# Patient Record
Sex: Male | Born: 1935 | Race: White | Hispanic: Yes | Marital: Married | Smoking: Former smoker
Health system: Southern US, Community
[De-identification: ages and names within clinical notes are randomized; demographics above are authoritative.]

## PROBLEM LIST (undated history)

## (undated) DIAGNOSIS — M1990 Unspecified osteoarthritis, unspecified site: Secondary | ICD-10-CM

## (undated) DIAGNOSIS — H349 Unspecified retinal vascular occlusion: Secondary | ICD-10-CM

## (undated) DIAGNOSIS — E119 Type 2 diabetes mellitus without complications: Secondary | ICD-10-CM

## (undated) DIAGNOSIS — E039 Hypothyroidism, unspecified: Secondary | ICD-10-CM

## (undated) DIAGNOSIS — I10 Essential (primary) hypertension: Secondary | ICD-10-CM

## (undated) DIAGNOSIS — E785 Hyperlipidemia, unspecified: Secondary | ICD-10-CM

## (undated) HISTORY — PX: REPLACEMENT TOTAL KNEE: SUR1224

## (undated) HISTORY — DX: Hyperlipidemia, unspecified: E78.5

## (undated) HISTORY — PX: ROTATOR CUFF REPAIR: SHX139

## (undated) HISTORY — DX: Essential (primary) hypertension: I10

## (undated) HISTORY — DX: Type 2 diabetes mellitus without complications: E11.9

---

## 2009-06-04 DIAGNOSIS — I2510 Atherosclerotic heart disease of native coronary artery without angina pectoris: Secondary | ICD-10-CM

## 2009-06-04 HISTORY — PX: CARDIAC CATHETERIZATION: SHX172

## 2009-06-04 HISTORY — DX: Atherosclerotic heart disease of native coronary artery without angina pectoris: I25.10

## 2019-02-20 LAB — HM DIABETES EYE EXAM

## 2019-03-06 LAB — HM DIABETES EYE EXAM

## 2019-03-20 LAB — HM DIABETES EYE EXAM

## 2019-04-17 LAB — HM DIABETES EYE EXAM

## 2019-09-25 ENCOUNTER — Encounter: Payer: Self-pay | Admitting: Cardiology

## 2020-03-15 ENCOUNTER — Other Ambulatory Visit: Payer: Self-pay

## 2020-03-15 ENCOUNTER — Ambulatory Visit: Payer: Medicare Other | Admitting: Cardiology

## 2020-03-15 ENCOUNTER — Encounter: Payer: Self-pay | Admitting: Cardiology

## 2020-03-15 VITALS — BP 130/66 | HR 78 | Temp 97.9°F | Resp 16 | Ht 66.5 in | Wt 161.5 lb

## 2020-03-15 DIAGNOSIS — I739 Peripheral vascular disease, unspecified: Secondary | ICD-10-CM

## 2020-03-15 DIAGNOSIS — I493 Ventricular premature depolarization: Secondary | ICD-10-CM

## 2020-03-15 DIAGNOSIS — Z794 Long term (current) use of insulin: Secondary | ICD-10-CM

## 2020-03-15 DIAGNOSIS — I2510 Atherosclerotic heart disease of native coronary artery without angina pectoris: Secondary | ICD-10-CM

## 2020-03-15 DIAGNOSIS — E1169 Type 2 diabetes mellitus with other specified complication: Secondary | ICD-10-CM

## 2020-03-15 DIAGNOSIS — E7800 Pure hypercholesterolemia, unspecified: Secondary | ICD-10-CM

## 2020-03-15 MED ORDER — NITROGLYCERIN 0.4 MG SL SUBL: 0.4000 mg | SUBLINGUAL_TABLET | SUBLINGUAL | 3 refills | Status: AC | PRN

## 2020-03-15 NOTE — Progress Notes (Signed)
Primary Physician/Referring:  Leanna Battles, MD  Patient ID: Jaime [name], male    DOB: [birth year], 86 y.o.   MRN: [id number]  Chief Complaint  Patient presents with  . Coronary Artery Disease  . New Patient (Initial Visit)    Referred by Dr. Philip Aspen   HPI:    [name]  is a 86 y.o. Retired Pharmacist, community by profession, hypertension, hyperlipidemia, diabetes mellitus, coronary artery disease with PTCA and stenting to the mid LAD with Xience 2.5 x 32 and overlapping 2.5 x 12 mm DES placed on 06/04/2009.  Since then he has not had any recurrence of angina pectoris.  Recently moved from [place] to be closer to his daughter in [place].  He also has history of PAD from diabetes mellitus.  Remains active.  His wife has MS and he is the active caregiver. Referred to me to establish cardiac care.  He is presently doing well and has been scheduled for complete physical examination with his PCP Dr. Leanna Battles soon.  Past Medical History:  Diagnosis Date  . Coronary artery disease 06/04/2009   stenting to the mid LAD with Xience 2.5 x 32 and overlapping 2.5 x 12 mm DES, [place], [place]  . Diabetes mellitus without complication ([place])   . Hyperlipidemia   . Hypertension    Past Surgical History:  Procedure Laterality Date  . CARDIAC CATHETERIZATION  06/04/2009   Stent to LAD in [place], [place]  . REPLACEMENT TOTAL KNEE     bOTH  . ROTATOR CUFF REPAIR Bilateral    Family History  Problem Relation Age of Onset  . Leukemia Mother   . Heart attack Brother   . Lung cancer Brother     Social History   Tobacco Use  . Smoking status: Never Smoker  . Smokeless tobacco: Never Used  Substance Use Topics  . Alcohol use: Yes    Comment: Drinks Socially   Marital Status:    ROS  Review of Systems  Constitution: Negative for weight gain.  Cardiovascular: Negative for dyspnea on exertion, leg swelling and syncope.  Respiratory: Negative for shortness of breath.     Musculoskeletal: Positive for back pain. Negative for joint swelling.   Objective  Blood pressure 130/66, pulse 78, temperature 97.9 F (36.6 C), temperature source Temporal, resp. rate 16, height 5' 6.5" (1.689 m), weight 161 lb 8 oz (73.3 kg), SpO2 95 %.  Vitals with BMI 03/15/2020  Height 5' 6.5"  Weight 161 lbs 8 oz  BMI 38.25  Systolic 053  Diastolic 66  Pulse 78     Physical Exam  Constitutional: He appears well-developed and well-nourished. No distress.  Cardiovascular: Normal rate, regular rhythm and intact distal pulses. Exam reveals no gallop.  No murmur heard. S1 is normal, S2 is widely split with physiologic variation.   No leg edema. No JVD.    Pulmonary/Chest: Effort normal and breath sounds normal. No accessory muscle usage. No respiratory distress.  Abdominal: Soft.   Laboratory examination:   No results for input(s): NA, K, CL, CO2, GLUCOSE, BUN, CREATININE, CALCIUM, GFRNONAA, GFRAA in the last 8760 hours. CrCl cannot be calculated (No successful lab value found.).  No flowsheet data found. No flowsheet data found. Lipid Panel  No results found for: CHOL, TRIG, HDL, CHOLHDL, VLDL, LDLCALC, LDLDIRECT HEMOGLOBIN A1C No results found for: HGBA1C, MPG TSH No results for input(s): TSH in the last 8760 hours.  External labs:    None  Medications and allergies  No Known Allergies  Current Outpatient Medications  Medication Instructions  . aspirin EC 81 mg, Oral, Daily  . gabapentin (NEURONTIN) 300 mg, Oral, Daily  . ibuprofen (ADVIL) 400 mg, Oral, Every 6 hours PRN  . Jardiance 25 mg, Oral, Daily  . levothyroxine (SYNTHROID) 150 MCG tablet 1 tablet, Oral, Daily  . levothyroxine (SYNTHROID) 175 mcg, Oral, Every other day  . Metoprolol Succinate 25 mg, Oral  . mirabegron ER (MYRBETRIQ) 50 mg, Oral, Daily  . nitroGLYCERIN (NITROSTAT) 0.4 mg, Sublingual, Every 5 min PRN  . rosuvastatin (CRESTOR) 20 MG tablet 1 tablet, Oral, Daily at bedtime  . Tresiba  FlexTouch 17 Units, Subcutaneous, Daily  . Vitamin D, Cholecalciferol, 50 MCG (2000 UT) CAPS 1 capsule, Oral, Daily  . vitamin E 400 Units, Oral, Daily  . zolpidem (AMBIEN) 10 mg, Oral, At bedtime PRN   Radiology:   No results found.  Cardiac Studies:    EKG  EKG 03/15/2020: Normal sinus rhythm at the rate of 71 bpm, inferior infarct old.  Right bundle branch block.  Low-voltage complexes.   EKG 03/15/2020 revealing lead reversal, hence repeat EKG performed.  Does reveal frequent PVCs.  Assessment     ICD-10-CM   1. Coronary artery disease involving native coronary artery of native heart without angina pectoris  I25.10 EKG 12-Lead    nitroGLYCERIN (NITROSTAT) 0.4 MG SL tablet    PCV ECHOCARDIOGRAM COMPLETE  2. Frequent unifocal PVCs  I49.3 PCV ECHOCARDIOGRAM COMPLETE  3. Pure hypercholesterolemia  E78.00   4. Peripheral vascular disease (HCC)  I73.9   5. Type 2 diabetes mellitus with other specified complication, with long-term current use of insulin (HCC)  E11.69    Z79.4     Meds ordered this encounter  Medications  . nitroGLYCERIN (NITROSTAT) 0.4 MG SL tablet    Sig: Place 1 tablet (0.4 mg total) under the tongue every 5 (five) minutes as needed for up to 25 days for chest pain.    Dispense:  25 tablet    Refill:  3    There are no discontinued medications.  Recommendations:   Jaime [name]  is a 86 y.o. Retired Education officer, community by profession, hypertension, hyperlipidemia, diabetes mellitus, coronary artery disease with PTCA and stenting to the mid LAD with Xience 2.5 x 32 and overlapping 2.5 x 12 mm DES placed on 06/04/2009.  Since then he has not had any recurrence of angina pectoris.  Recently moved from [place] to be closer to his daughter in [place].  He is presently asymptomatic and physical examination except for widely split second heart sound related to underlying right bundle branch block, do not see any abnormalities.  Vascular examination mildly abnormal with  mildly reduced pedal pulse otherwise normal.  I will obtain an echocardiogram to establish a baseline for known PAD and abnormal EKG.  Otherwise stable from cardiac standpoint and is on appropriate medical therapy.  I will to see him back in 3 months as he is new to me.  His blood pressures well controlled and is on high-dose and high intensity statins as well.  S/L NTG was prescribed and explained how to and when to use it and to notify us if there is change in frequency of use.  Yates Decamp, MD, [facility] 03/17/2020, 11:01 PM [facility] Pager: [phone] Office: [phone]

## 2020-03-17 ENCOUNTER — Encounter: Payer: Self-pay | Admitting: Cardiology

## 2020-03-23 ENCOUNTER — Other Ambulatory Visit: Payer: Self-pay

## 2020-03-23 ENCOUNTER — Ambulatory Visit (INDEPENDENT_AMBULATORY_CARE_PROVIDER_SITE_OTHER): Payer: Medicare Other | Admitting: Physical Medicine and Rehabilitation

## 2020-03-23 ENCOUNTER — Ambulatory Visit (INDEPENDENT_AMBULATORY_CARE_PROVIDER_SITE_OTHER): Payer: Medicare Other

## 2020-03-23 VITALS — BP 133/81 | HR 69 | Ht 66.5 in | Wt 162.0 lb

## 2020-03-23 DIAGNOSIS — G8929 Other chronic pain: Secondary | ICD-10-CM | POA: Diagnosis not present

## 2020-03-23 DIAGNOSIS — I2510 Atherosclerotic heart disease of native coronary artery without angina pectoris: Secondary | ICD-10-CM | POA: Diagnosis not present

## 2020-03-23 DIAGNOSIS — M5416 Radiculopathy, lumbar region: Secondary | ICD-10-CM

## 2020-03-23 DIAGNOSIS — M5442 Lumbago with sciatica, left side: Secondary | ICD-10-CM | POA: Diagnosis not present

## 2020-03-23 DIAGNOSIS — M961 Postlaminectomy syndrome, not elsewhere classified: Secondary | ICD-10-CM | POA: Diagnosis not present

## 2020-03-23 NOTE — Progress Notes (Signed)
Pt states pain across the lower back that radiates into the outside of the left thigh. Pt states pain started 6-7 years ago and has gotten worse in the past few months. Pt states injection done by Dr. Zachery Conch in [place] in February, pt states it helped at first and returned 3 months after. Pt states standing still and playing golf makes pain worse. Sitting and stretching helps with pain.   .Numeric Pain Rating Scale and Functional Assessment Average Pain 8 Pain Right Now 3 My pain is intermittent and dull Pain is worse with: standing Pain improves with: sitting   In the last MONTH (on 0-10 scale) has pain interfered with the following?  1. General activity like being  able to carry out your everyday physical activities such as walking, climbing stairs, carrying groceries, or moving a chair?  Rating(8)  2. Relation with others like being able to carry out your usual social activities and roles such as  activities at home, at work and in your community. Rating(8)  3. Enjoyment of life such that you have  been bothered by emotional problems such as feeling anxious, depressed or irritable?  Rating(4)

## 2020-03-25 ENCOUNTER — Ambulatory Visit: Payer: Self-pay

## 2020-03-25 ENCOUNTER — Ambulatory Visit (INDEPENDENT_AMBULATORY_CARE_PROVIDER_SITE_OTHER): Payer: Medicare Other | Admitting: Physical Medicine and Rehabilitation

## 2020-03-25 ENCOUNTER — Encounter: Payer: Self-pay | Admitting: Physical Medicine and Rehabilitation

## 2020-03-25 ENCOUNTER — Other Ambulatory Visit: Payer: Self-pay

## 2020-03-25 VITALS — BP 111/62 | HR 78

## 2020-03-25 DIAGNOSIS — M5416 Radiculopathy, lumbar region: Secondary | ICD-10-CM | POA: Diagnosis not present

## 2020-03-25 MED ORDER — METHYLPREDNISOLONE ACETATE 80 MG/ML IJ SUSP
40.0000 mg | Freq: Once | INTRAMUSCULAR | Status: AC
  Administered 2020-03-25: 40 mg

## 2020-03-25 NOTE — Progress Notes (Signed)
Pt states pain in the lower back that radiates into the left thigh. Pt states no major changes since last visit   .Numeric Pain Rating Scale and Functional Assessment Average Pain 5   In the last MONTH (on 0-10 scale) has pain interfered with the following?  1. General activity like being  able to carry out your everyday physical activities such as walking, climbing stairs, carrying groceries, or moving a chair?  Rating(5)   +Driver, -BT, -Dye Allergies.

## 2020-03-26 NOTE — Progress Notes (Signed)
Jaime [name] - 86 y.o. male MRN [id number]  Date of birth: 11-08-1930  Office Visit Note: Visit Date: 03/25/2020 PCP: Leanna Battles, MD Referred by: Leanna Battles, MD  Subjective: No chief complaint on file.  HPI:  Jaime [name] is a 86 y.o. male who comes in today for planned Left L2-L3 lumbar epidural steroid injection with fluoroscopic guidance.  The patient has failed conservative care including home exercise, medications, time and activity modification.  This injection will be diagnostic and hopefully therapeutic.  Please see requesting physician notes for further details and justification.  Patient wants to make appointment for 3 months out as this is the way he had done this previously with his prior provider who did lumbar injections.  We typically do not do that but I will go ahead make his appointment at 3 months but he knows to cancel or call if the symptoms just are not improved or if he still better at that point obviously would not need to do an injection every 3 months.  Did talk to him once again about his L5 symptoms and would consider L5 transforaminal injection.   ROS Otherwise per HPI.  Assessment & Plan: Visit Diagnoses:  1. Lumbar radiculopathy     Plan: No additional findings.   Meds & Orders:  Meds ordered this encounter  Medications  . methylPREDNISolone acetate (DEPO-MEDROL) injection 40 mg    Orders Placed This Encounter  Procedures  . XR C-ARM NO REPORT  . Epidural Steroid injection    Follow-up: Return in about 3 months (around 06/25/2020).   Procedures: No procedures performed  Lumbar Epidural Steroid Injection - Interlaminar Approach with Fluoroscopic Guidance  Patient: Jaime [name]      Date of Birth: [birth year] MRN: [id number] PCP: Leanna Battles, MD      Visit Date: 03/25/2020   Universal Protocol:     Consent Given By: the patient  Position: PRONE  Additional Comments: Vital signs were monitored before and after  the procedure. Patient was prepped and draped in the usual sterile fashion. The correct patient, procedure, and site was verified.   Injection Procedure Details:  Procedure Site One Meds Administered:  Meds ordered this encounter  Medications  . methylPREDNISolone acetate (DEPO-MEDROL) injection 40 mg     Laterality: Left  Location/Site:  L2-L3  Needle size: 20 G  Needle type: Tuohy  Needle Placement: Paramedian epidural  Findings:   -Comments: Excellent flow of contrast into the epidural space.  Procedure Details: Using a paramedian approach from the side mentioned above, the region overlying the inferior lamina was localized under fluoroscopic visualization and the soft tissues overlying this structure were infiltrated with 4 ml. of 1% Lidocaine without Epinephrine. The Tuohy needle was inserted into the epidural space using a paramedian approach.   The epidural space was localized using loss of resistance along with lateral and bi-planar fluoroscopic views.  After negative aspirate for air, blood, and CSF, a 2 ml. volume of Isovue-250 was injected into the epidural space and the flow of contrast was observed. Radiographs were obtained for documentation purposes.    The injectate was administered into the level noted above.   Additional Comments:  The patient tolerated the procedure well Dressing: 2 x 2 sterile gauze and Band-Aid    Post-procedure details: Patient was observed during the procedure. Post-procedure instructions were reviewed.  Patient left the clinic in stable condition.     Clinical History: No specialty comments available.     Objective:  VS:  HT:  WT:   BMI:     BP:111/62  HR:78bpm  TEMP: ( )  RESP:  Physical Exam Constitutional:      General: He is not in acute distress.    Appearance: Normal appearance. He is not ill-appearing.  HENT:     Head: Normocephalic and atraumatic.     Right Ear: External ear normal.     Left Ear:  External ear normal.  Eyes:     Extraocular Movements: Extraocular movements intact.  Cardiovascular:     Rate and Rhythm: Normal rate.     Pulses: Normal pulses.  Abdominal:     General: There is no distension.     Palpations: Abdomen is soft.  Musculoskeletal:        General: No tenderness or signs of injury.     Right lower leg: No edema.     Left lower leg: No edema.     Comments: Patient has good distal strength without clonus.  Skin:    Findings: No erythema or rash.  Neurological:     General: No focal deficit present.     Mental Status: He is alert and oriented to person, place, and time.     Sensory: No sensory deficit.     Motor: No weakness or abnormal muscle tone.     Coordination: Coordination normal.  Psychiatric:        Mood and Affect: Mood normal.        Behavior: Behavior normal.      Imaging: XR C-ARM NO REPORT  Result Date: 03/25/2020 Please see Notes tab for imaging impression.

## 2020-03-26 NOTE — Procedures (Signed)
Lumbar Epidural Steroid Injection - Interlaminar Approach with Fluoroscopic Guidance  Patient: Jaime [name]      Date of Birth: 1931/04/20 MRN: [id number] PCP: Jarome Matin, MD      Visit Date: 03/25/2020   Universal Protocol:     Consent Given By: the patient  Position: PRONE  Additional Comments: Vital signs were monitored before and after the procedure. Patient was prepped and draped in the usual sterile fashion. The correct patient, procedure, and site was verified.   Injection Procedure Details:  Procedure Site One Meds Administered:  Meds ordered this encounter  Medications  . methylPREDNISolone acetate (DEPO-MEDROL) injection 40 mg     Laterality: Left  Location/Site:  L2-L3  Needle size: 20 G  Needle type: Tuohy  Needle Placement: Paramedian epidural  Findings:   -Comments: Excellent flow of contrast into the epidural space.  Procedure Details: Using a paramedian approach from the side mentioned above, the region overlying the inferior lamina was localized under fluoroscopic visualization and the soft tissues overlying this structure were infiltrated with 4 ml. of 1% Lidocaine without Epinephrine. The Tuohy needle was inserted into the epidural space using a paramedian approach.   The epidural space was localized using loss of resistance along with lateral and bi-planar fluoroscopic views.  After negative aspirate for air, blood, and CSF, a 2 ml. volume of Isovue-250 was injected into the epidural space and the flow of contrast was observed. Radiographs were obtained for documentation purposes.    The injectate was administered into the level noted above.   Additional Comments:  The patient tolerated the procedure well Dressing: 2 x 2 sterile gauze and Band-Aid    Post-procedure details: Patient was observed during the procedure. Post-procedure instructions were reviewed.  Patient left the clinic in stable condition.

## 2020-03-31 ENCOUNTER — Encounter: Payer: Self-pay | Admitting: Physical Medicine and Rehabilitation

## 2020-03-31 NOTE — Progress Notes (Signed)
Jaime [name] - 86 y.o. male MRN [id number]  Date of birth: November 01, 1931  Office Visit Note: Visit Date: 03/23/2020 PCP: Jarome Matin, MD Referred by: Jarome Matin, MD  Subjective: Chief Complaint  Patient presents with  . Lower Back - Pain  . Left Thigh - Pain   HPI: Jaime [name] is a 86 y.o. male who comes in today For new patient evaluation at the request of Dr. Dossie Arbour for chronic worsening low back pain with recent exacerbation in severity and radiation in the lateral left thigh sometimes past the knee.  We did have several pages of notes from Dr. Jarold Motto as well as notes from Dr. Zachery Conch in [place] at a sport and pain medicine clinic.  According to the patient he has been having 6 to 7 years of worsening low back pain with pain referring into the left lateral thigh sometimes past the knee with intermittent severity and exacerbations.  Over that time he saw Dr. Zachery Conch and ultimately was given an injection which was an interlaminar epidural steroid injection at L2-3.  This is above prior laminectomies done at L4-5 and L5-S1.  According to those notes Dr. Neale Burly felt like his referral pattern was more of an L2 to L3 distribution on the left.  He denies any right-sided complaints.  Symptoms are somewhat different than he had when he had his surgery but they are in a very similar location at times.  He has had no specific injury over the 6 or 7 years and he still pretty active playing golf and staying otherwise active.  It does seem to worsen significantly with standing still and playing golf.  He reports relief with sitting and stretching.  Stretching seems to help quite a bit.  His average pain right now with exacerbation is 8 out of 10.  He reports 3 out of 10 pain just sitting in the office.  Again is more of a intermittent dull pain.  No paresthesias no focal weakness.  He was told by Dr. Neale Burly that the injections could not be done at a lower level because of the prior  surgery.  All injections that I have seen done have been interlaminar epidural injections.  He does use gabapentin and ibuprofen for some pain relief at times.  He has had physical therapy in the past and remains active.  His case is complicated by type 2 diabetes as well as coronary artery disease.  He has had both knees replaced.  Review of Systems  Musculoskeletal: Positive for back pain and joint pain.       Left radicular type leg pain  All other systems reviewed and are negative.  Otherwise per HPI.  Assessment & Plan: Visit Diagnoses:  1. Chronic midline low back pain with left-sided sciatica   2. Lumbar radiculopathy   3. Post laminectomy syndrome     Plan: Findings:  Chronic long history of lumbar spine and back pain with radicular type symptoms.  Status post remote lumbar laminectomy at L4-5 and L5-S1.  Now with fairly degenerative spine with scoliosis and facet arthropathy and degenerative disc height loss.  Prior MRI some years ago without much in the way of central stenosis at all or disc herniation.  Injections performed in [place] by Dr. Zachery Conch at L2-3 have been helpful for the patient for 3 months or so at a time.  His symptoms seem to me to be more L5 related symptoms.  Its posterior lateral thigh occasionally past the knee into the anterior lateral  aspect of the leg.  He is doing well with the current medications and he does get intermittent injection.  The neck step because of recent exacerbation would be to repeat the L2-3 interlaminar injection just because it is helped.  I did tell the patient that there are different ways to do the injection and that clearly a transforaminal approach at L5 could be done in the setting of prior surgeries as we do this all the time.  He may have some back pain which is facet mediated pain as well and that could be looked at.  We will see how he does with the interlaminar injection first.    Meds & Orders: No orders of the defined types were  placed in this encounter.   Orders Placed This Encounter  Procedures  . XR Lumbar Spine 2-3 Views    Follow-up: Return for Left L2-3 interlaminar epidural steroid injection.   Procedures: No procedures performed  No notes on file   Clinical History: No specialty comments available.   He reports that he has never smoked. He has never used smokeless tobacco. No results for input(s): HGBA1C, LABURIC in the last 8760 hours.  Objective:  VS:  HT:5' 6.5" (168.9 cm)   WT:162 lb (73.5 kg)  BMI:25.76    BP:133/81  HR:69bpm  TEMP: ( )  RESP:  Physical Exam Vitals and nursing note reviewed.  Constitutional:      General: He is not in acute distress.    Appearance: He is well-developed.  HENT:     Head: Normocephalic and atraumatic.     Nose: Nose normal.     Mouth/Throat:     Mouth: Mucous membranes are moist.     Pharynx: Oropharynx is clear.  Eyes:     Conjunctiva/sclera: Conjunctivae normal.     Pupils: Pupils are equal, round, and reactive to light.  Neck:     Trachea: No tracheal deviation.  Cardiovascular:     Rate and Rhythm: Normal rate and regular rhythm.     Pulses: Normal pulses.  Pulmonary:     Effort: Pulmonary effort is normal.     Breath sounds: Normal breath sounds.  Abdominal:     General: There is no distension.     Palpations: Abdomen is soft.     Tenderness: There is no guarding or rebound.  Musculoskeletal:        General: No deformity.     Cervical back: Normal range of motion and neck supple.     Right lower leg: No edema.     Left lower leg: No edema.     Comments: Patient ambulates without aid he is somewhat slow to rise from a seated position to full extension.  He has old midline laminectomy surgical scar.  Scoliosis is not very evident on clinical exam.  He does have pain with palpation along the left PSIS and greater trochanter but the trochanters not exquisite.  He has no pain with hip rotation bilaterally he has good strength without clonus.   Skin:    General: Skin is warm and dry.     Findings: No erythema or rash.  Neurological:     General: No focal deficit present.     Mental Status: He is alert and oriented to person, place, and time.     Sensory: No sensory deficit.     Motor: No weakness or abnormal muscle tone.     Coordination: Coordination normal.     Gait: Gait normal.  Psychiatric:  Mood and Affect: Mood normal.        Behavior: Behavior normal.        Thought Content: Thought content normal.     Ortho Exam  Imaging: AP and lateral cervical spine dated today: AP and lateral lumbar spine shows left leaning thoracolumbar scoliosis with a rotatory component right concave centered at L2.  He does have somewhat unequal pelvic height.  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  He has reduced lordosis and multilevel degenerative disc height loss.  Past Medical/Family/Surgical/Social History: Medications & Allergies reviewed per EMR, new medications updated. There are no problems to display for this patient.  Past Medical History:  Diagnosis Date  . Coronary artery disease 06/04/2009   stenting to the mid LAD with Xience 2.5 x 32 and overlapping 2.5 x 12 mm DES, [place], [place]  . Diabetes mellitus without complication (HCC)   . Hyperlipidemia   . Hypertension    Family History  Problem Relation Age of Onset  . Leukemia Mother   . Heart attack Brother   . Lung cancer Brother    Past Surgical History:  Procedure Laterality Date  . CARDIAC CATHETERIZATION  06/04/2009   Stent to LAD in [place], [place]  . REPLACEMENT TOTAL KNEE     bOTH  . ROTATOR CUFF REPAIR Bilateral    Social History   Occupational History  . Not on file  Tobacco Use  . Smoking status: Never Smoker  . Smokeless tobacco: Never Used  Substance and Sexual Activity  . Alcohol use: Yes    Comment: Drinks Socially  . Drug use: Never  . Sexual activity: Not on file

## 2020-04-13 ENCOUNTER — Other Ambulatory Visit: Payer: Medicare Other

## 2020-04-20 ENCOUNTER — Other Ambulatory Visit: Payer: Self-pay

## 2020-04-20 ENCOUNTER — Ambulatory Visit: Payer: Medicare Other

## 2020-04-20 DIAGNOSIS — I493 Ventricular premature depolarization: Secondary | ICD-10-CM

## 2020-04-20 DIAGNOSIS — I2510 Atherosclerotic heart disease of native coronary artery without angina pectoris: Secondary | ICD-10-CM

## 2020-04-22 NOTE — Progress Notes (Signed)
Called and spoke with patient regarding his echocardiogram results.  ?

## 2020-06-16 ENCOUNTER — Encounter: Payer: Self-pay | Admitting: Cardiology

## 2020-06-17 ENCOUNTER — Ambulatory Visit: Payer: Medicare Other | Admitting: Cardiology

## 2020-06-29 ENCOUNTER — Other Ambulatory Visit: Payer: Self-pay

## 2020-06-29 ENCOUNTER — Ambulatory Visit (INDEPENDENT_AMBULATORY_CARE_PROVIDER_SITE_OTHER): Payer: Medicare Other | Admitting: Physical Medicine and Rehabilitation

## 2020-06-29 ENCOUNTER — Encounter: Payer: Self-pay | Admitting: Physical Medicine and Rehabilitation

## 2020-06-29 ENCOUNTER — Ambulatory Visit: Payer: Self-pay

## 2020-06-29 VITALS — BP 108/63 | HR 76

## 2020-06-29 DIAGNOSIS — M25551 Pain in right hip: Secondary | ICD-10-CM | POA: Diagnosis not present

## 2020-06-29 DIAGNOSIS — M961 Postlaminectomy syndrome, not elsewhere classified: Secondary | ICD-10-CM | POA: Diagnosis not present

## 2020-06-29 DIAGNOSIS — M5441 Lumbago with sciatica, right side: Secondary | ICD-10-CM

## 2020-06-29 DIAGNOSIS — M5416 Radiculopathy, lumbar region: Secondary | ICD-10-CM | POA: Diagnosis not present

## 2020-06-29 DIAGNOSIS — I2510 Atherosclerotic heart disease of native coronary artery without angina pectoris: Secondary | ICD-10-CM

## 2020-06-29 MED ORDER — METHYLPREDNISOLONE ACETATE 80 MG/ML IJ SUSP
80.0000 mg | Freq: Once | INTRAMUSCULAR | Status: AC
  Administered 2020-06-29: 80 mg

## 2020-06-29 NOTE — Progress Notes (Signed)
Rupert Azzara - 86 y.o. male MRN [id number]  Date of birth: 1931/01/14  Office Visit Note: Visit Date: 06/29/2020 PCP: Jarome Matin, MD Referred by: Jarome Matin, MD  Subjective: Chief Complaint  Patient presents with  . Lower Back - Pain  . Right Hip - Pain  . Left Leg - Pain   HPI: Kendal Raffo is a 86 y.o. male who comes in today For evaluation and management of chronic worsening severe at times low back pain with radicular left leg pain.  He also has an acute issue with 1 week of right hip and upper thigh pain.  No groin pain.  Overall pain is really only a 2 out of 10 on average but this just with sitting and resting.  It does limit what he can do.  He does take ibuprofen and gabapentin.  His history can be reviewed in our prior notes.  Basically was seen in [place] for many years with intermittent L2-3 epidural injection from an interlaminar approach that seemed to help him quite a bit.  This was done every 3 to 4 months on a schedule.  The scheduled injections are not something we typically do but I did agree to see him back today for possible injection and for evaluation.  He reports the last injection did seem to help he said it helped for about 2 months and then the symptoms returned.  He denies any new symptoms on the left.  No focal weakness.  He has had a history of lumbar laminectomy at 2 levels.  He has a remote MRI that was reviewed from the old notes from [place].  No current MRI recent.  No red flag complaints.  No fever chills night sweats.  No unintended weight loss etc.  No trauma.  He reports a week of acute onset of right hip and thigh pain.  No specific trauma.  Nothing really past the knee although sometimes a little bit past the knee.  No paresthesias.  No groin pain.  No real pain getting out of the car.  Pain is really just present most of the time but again only going on for a week.  History also complicated by schedule for carpal tunnel release by Dr.  Amanda Pea on Friday.  Review of Systems  Musculoskeletal: Positive for back pain and joint pain.  Neurological: Positive for tingling.  All other systems reviewed and are negative.  Otherwise per HPI.  Assessment & Plan: Visit Diagnoses:  1. Lumbar radiculopathy   2. Post laminectomy syndrome   3. Acute right-sided low back pain with right-sided sciatica   4. Acute right hip pain     Plan: Findings:  1.  Chronic worsening severe back pain with referral in the left leg status post history of lumbar laminectomy postlaminectomy syndrome.  Prior injections in [place] were successful.  Last injection really the first injection we tried at L2-3 did seem to help him for a couple of months.  We had talked with him about potential for transforaminal approach at the level of the surgery which would fit a little bit more with his pattern on the left which is more L5 or S1.  Have also spoken with him about potential for lumbar spine MRI as an update just to give more of a target to look at potential for a better injection.  Today I did decide to go ahead and repeat the L2-3 injection.  As noted in the procedure report he did fine with the injection and  comments that he really did not feel the medicine going in at all.  He states that when he had those in the past he did feel the medicine go down the leg.  Clearly an L2-3 region should not necessarily cause pain.  Travel in the more L5 distribution.  Nonetheless we will see how he does depending on relief would look at MRI of the lumbar spine or potentially L5 transforaminal injection.  He will stay on current medications.  2.  Acute onset right hip and thigh pain.  This seems to be just more of a muscle sprain strain.  I have told him to stay active with range of motion he can use heat or ice on this.  We will just can watch and see how it does.  Obviously if it spine related at all epidural injection may help it.  If it just starts to get worse or goes down past  the knee or paresthesias obviously would evaluate this more.  For right hours, have some watchful waiting on this.     Meds & Orders:  Meds ordered this encounter  Medications  . methylPREDNISolone acetate (DEPO-MEDROL) injection 80 mg    Orders Placed This Encounter  Procedures  . XR C-ARM NO REPORT  . Epidural Steroid injection    Follow-up: Return if symptoms worsen or fail to improve, for Will call to schedule for possible 3 to 65-month follow-up..   Procedures: No procedures performed  Lumbar Epidural Steroid Injection - Interlaminar Approach with Fluoroscopic Guidance  Patient: Deshone Lyssy      Date of Birth: [birth year] MRN: [id number] PCP: Jarome Matin, MD      Visit Date: 06/29/2020   Universal Protocol:     Consent Given By: the patient  Position: PRONE  Additional Comments: Vital signs were monitored before and after the procedure. Patient was prepped and draped in the usual sterile fashion. The correct patient, procedure, and site was verified.   Injection Procedure Details:  Procedure Site One Meds Administered:  Meds ordered this encounter  Medications  . methylPREDNISolone acetate (DEPO-MEDROL) injection 80 mg     Laterality: Left  Location/Site:  L2-L3  Needle size: 20 G  Needle type: Tuohy  Needle Placement: Paramedian epidural  Findings:   -Comments: Excellent flow of contrast into the epidural space.  Procedure Details: Using a paramedian approach from the side mentioned above, the region overlying the inferior lamina was localized under fluoroscopic visualization and the soft tissues overlying this structure were infiltrated with 4 ml. of 1% Lidocaine without Epinephrine. The Tuohy needle was inserted into the epidural space using a paramedian approach.   The epidural space was localized using loss of resistance along with lateral and bi-planar fluoroscopic views.  After negative aspirate for air, blood, and CSF, a 2 ml. volume  of Isovue-250 was injected into the epidural space and the flow of contrast was observed. Radiographs were obtained for documentation purposes.    The injectate was administered into the level noted above.   Additional Comments:  The patient tolerated the procedure well Dressing: 2 x 2 sterile gauze and Band-Aid    Post-procedure details: Patient was observed during the procedure. Post-procedure instructions were reviewed.  Patient left the clinic in stable condition.    Clinical History: No specialty comments available.   He reports that he has never smoked. He has never used smokeless tobacco. No results for input(s): HGBA1C, LABURIC in the last 8760 hours.  Objective:  VS:  HT:  WT:   BMI:     BP:108/63  HR:76bpm  TEMP: ( )  RESP:  Physical Exam Constitutional:      General: He is not in acute distress.    Appearance: Normal appearance. He is not ill-appearing.  HENT:     Head: Normocephalic and atraumatic.     Right Ear: External ear normal.     Left Ear: External ear normal.  Eyes:     Extraocular Movements: Extraocular movements intact.  Cardiovascular:     Rate and Rhythm: Normal rate.     Pulses: Normal pulses.  Abdominal:     General: There is no distension.     Palpations: Abdomen is soft.  Musculoskeletal:        General: No tenderness or signs of injury.     Right lower leg: No edema.     Left lower leg: No edema.     Comments: Patient has good distal strength without clonus.  He ambulates without aid with a forward flexed lumbar spine.  He is really stiff with extension he does have pain with facet loading and extension.  No pain over the greater trochanters no pain with hip rotation on the right or left.  No real pain over the right greater trochanter.  He is wearing a splint on the right arm for carpal tunnel syndrome.  Skin:    Findings: No erythema or rash.  Neurological:     General: No focal deficit present.     Mental Status: He is alert and  oriented to person, place, and time.     Sensory: No sensory deficit.     Motor: No weakness or abnormal muscle tone.     Coordination: Coordination normal.  Psychiatric:        Mood and Affect: Mood normal.        Behavior: Behavior normal.     Ortho Exam  Imaging: XR C-ARM NO REPORT  Result Date: 06/29/2020 Please see Notes tab for imaging impression.   Past Medical/Family/Surgical/Social History: Medications & Allergies reviewed per EMR, new medications updated. There are no problems to display for this patient.  Past Medical History:  Diagnosis Date  . Coronary artery disease 06/04/2009   stenting to the mid LAD with Xience 2.5 x 32 and overlapping 2.5 x 12 mm DES, [place], [place]  . Diabetes mellitus without complication (HCC)   . Hyperlipidemia   . Hypertension    Family History  Problem Relation Age of Onset  . Leukemia Mother   . Heart attack Brother   . Lung cancer Brother    Past Surgical History:  Procedure Laterality Date  . CARDIAC CATHETERIZATION  06/04/2009   Stent to LAD in [place], [place]  . REPLACEMENT TOTAL KNEE     bOTH  . ROTATOR CUFF REPAIR Bilateral    Social History   Occupational History  . Not on file  Tobacco Use  . Smoking status: Never Smoker  . Smokeless tobacco: Never Used  Vaping Use  . Vaping Use: Never used  Substance and Sexual Activity  . Alcohol use: Yes    Comment: Drinks Socially  . Drug use: Never  . Sexual activity: Not on file

## 2020-06-29 NOTE — Procedures (Signed)
Lumbar Epidural Steroid Injection - Interlaminar Approach with Fluoroscopic Guidance  Patient: Jaime [name]      Date of Birth: 03-24-1931 MRN: [id number] PCP: Jarome Matin, MD      Visit Date: 06/29/2020   Universal Protocol:     Consent Given By: the patient  Position: PRONE  Additional Comments: Vital signs were monitored before and after the procedure. Patient was prepped and draped in the usual sterile fashion. The correct patient, procedure, and site was verified.   Injection Procedure Details:  Procedure Site One Meds Administered:  Meds ordered this encounter  Medications  . methylPREDNISolone acetate (DEPO-MEDROL) injection 80 mg     Laterality: Left  Location/Site:  L2-L3  Needle size: 20 G  Needle type: Tuohy  Needle Placement: Paramedian epidural  Findings:   -Comments: Excellent flow of contrast into the epidural space.  Procedure Details: Using a paramedian approach from the side mentioned above, the region overlying the inferior lamina was localized under fluoroscopic visualization and the soft tissues overlying this structure were infiltrated with 4 ml. of 1% Lidocaine without Epinephrine. The Tuohy needle was inserted into the epidural space using a paramedian approach.   The epidural space was localized using loss of resistance along with lateral and bi-planar fluoroscopic views.  After negative aspirate for air, blood, and CSF, a 2 ml. volume of Isovue-250 was injected into the epidural space and the flow of contrast was observed. Radiographs were obtained for documentation purposes.    The injectate was administered into the level noted above.   Additional Comments:  The patient tolerated the procedure well Dressing: 2 x 2 sterile gauze and Band-Aid    Post-procedure details: Patient was observed during the procedure. Post-procedure instructions were reviewed.  Patient left the clinic in stable condition.

## 2020-06-29 NOTE — Progress Notes (Signed)
Pt states lower back pain that travels down the left leg and in the morning the pain travels to his right knee, than goes away. Pt states standing in one place makes the pain worse. Pt state sitting and pain pills help with pain. Pt has hx of inj on 03/25/20 it was okay it didn't last long but for only two month. Pt state in July the pain return.  Numeric Pain Rating Scale and Functional Assessment Average Pain 2   In the last MONTH (on 0-10 scale) has pain interfered with the following?  1. General activity like being  able to carry out your everyday physical activities such as walking, climbing stairs, carrying groceries, or moving a chair?  Rating(8)   +Driver, -BT, -Dye Allergies.

## 2020-07-02 NOTE — Progress Notes (Signed)
Called patient to schedule. His daughter advised that he just had surgery and is in recovery and asked that I call back on Monday.

## 2020-07-05 ENCOUNTER — Telehealth: Payer: Self-pay

## 2020-07-05 NOTE — Telephone Encounter (Signed)
Scheduled

## 2020-07-05 NOTE — Telephone Encounter (Signed)
Patient calling back to sch appt

## 2020-07-23 ENCOUNTER — Other Ambulatory Visit: Payer: Self-pay | Admitting: Physical Medicine and Rehabilitation

## 2020-07-23 ENCOUNTER — Telehealth: Payer: Self-pay

## 2020-07-23 DIAGNOSIS — M961 Postlaminectomy syndrome, not elsewhere classified: Secondary | ICD-10-CM

## 2020-07-23 DIAGNOSIS — F411 Generalized anxiety disorder: Secondary | ICD-10-CM

## 2020-07-23 DIAGNOSIS — M5416 Radiculopathy, lumbar region: Secondary | ICD-10-CM

## 2020-07-23 MED ORDER — DIAZEPAM 5 MG PO TABS: ORAL_TABLET | ORAL | 0 refills | Status: DC

## 2020-07-23 NOTE — Telephone Encounter (Signed)
Patient states that the L2-3 IL he had in August did not help. Ok to order MRI? Patient also states that he will need Valium prior to the MRI. Pharmacy is correct.

## 2020-07-23 NOTE — Telephone Encounter (Signed)
Remind to send valium

## 2020-07-23 NOTE — Telephone Encounter (Signed)
Patient wants to talk to Dr.Newton about scheduling a MRI call [phone].

## 2020-07-23 NOTE — Addendum Note (Signed)
Addended by: Cindie Crumbly B on: 07/23/2020 10:15 AM   Modules accepted: Orders

## 2020-07-23 NOTE — Progress Notes (Unsigned)
Pre-procedure diazepam ordered for pre-operative anxiety.  

## 2020-07-23 NOTE — Telephone Encounter (Signed)
Valium pre MRI

## 2020-07-26 NOTE — Telephone Encounter (Signed)
done

## 2020-08-05 ENCOUNTER — Encounter (INDEPENDENT_AMBULATORY_CARE_PROVIDER_SITE_OTHER): Payer: Self-pay | Admitting: Ophthalmology

## 2020-08-05 ENCOUNTER — Other Ambulatory Visit: Payer: Self-pay

## 2020-08-05 ENCOUNTER — Ambulatory Visit (INDEPENDENT_AMBULATORY_CARE_PROVIDER_SITE_OTHER): Payer: Medicare Other | Admitting: Ophthalmology

## 2020-08-05 DIAGNOSIS — H3411 Central retinal artery occlusion, right eye: Secondary | ICD-10-CM | POA: Insufficient documentation

## 2020-08-05 DIAGNOSIS — H4720 Unspecified optic atrophy: Secondary | ICD-10-CM | POA: Insufficient documentation

## 2020-08-05 DIAGNOSIS — I2510 Atherosclerotic heart disease of native coronary artery without angina pectoris: Secondary | ICD-10-CM

## 2020-08-05 NOTE — Progress Notes (Signed)
08/05/2020     CHIEF COMPLAINT Patient presents for Retina Follow Up   HISTORY OF PRESENT ILLNESS: Jaime [name] is a 86 y.o. male who presents to the clinic today for:   HPI    Retina Follow Up    Patient presents with  CRVO/BRVO.  In both eyes.  Severity is moderate.  Duration of 6 months.  Since onset it is stable.  I, the attending physician,  performed the HPI with the patient and updated documentation appropriately.          Comments    6 Month f\u OU. FP  Pt states is doing well. Pt c/o occasional floaters.  BGL: 92 A1C: 6.4       Last edited by Edmon Crape, MD on 08/05/2020 11:54 AM. (History)      Referring physician: No referring provider defined for this encounter.  HISTORICAL INFORMATION:   Selected notes from the MEDICAL RECORD NUMBER       CURRENT MEDICATIONS: No current outpatient medications on file. (Ophthalmic Drugs)   No current facility-administered medications for this visit. (Ophthalmic Drugs)   Current Outpatient Medications (Other)  Medication Sig  . aspirin EC 81 MG tablet Take 81 mg by mouth daily.  . diazepam (VALIUM) 5 MG tablet Take 1 by mouth 1 hour  pre-procedure with very light food. May take 2nd tablet if needed.  . gabapentin (NEURONTIN) 300 MG capsule Take 300 mg by mouth daily.  Marland Kitchen ibuprofen (ADVIL) 400 MG tablet Take 400 mg by mouth every 6 (six) hours as needed.  Marland Kitchen JARDIANCE 25 MG TABS tablet Take 25 mg by mouth daily.  Marland Kitchen levothyroxine (SYNTHROID) 150 MCG tablet Take 1 tablet by mouth daily.  Marland Kitchen levothyroxine (SYNTHROID) 175 MCG tablet Take 175 mcg by mouth every other day.  . Metoprolol Succinate 25 MG CS24 Take 25 mg by mouth.  . mirabegron ER (MYRBETRIQ) 50 MG TB24 tablet Take 50 mg by mouth daily.  . nitroGLYCERIN (NITROSTAT) 0.4 MG SL tablet Place 1 tablet (0.4 mg total) under the tongue every 5 (five) minutes as needed for up to 25 days for chest pain. (Patient not taking: Reported on 03/23/2020)  . rosuvastatin  (CRESTOR) 20 MG tablet Take 1 tablet by mouth at bedtime.  Evaristo Bury FLEXTOUCH 100 UNIT/ML FlexTouch Pen Inject 17 Units into the skin daily.  . Vitamin D, Cholecalciferol, 50 MCG (2000 UT) CAPS Take 1 capsule by mouth daily.  . vitamin E 180 MG (400 UNITS) capsule Take 400 Units by mouth daily.  Marland Kitchen zolpidem (AMBIEN) 10 MG tablet Take 10 mg by mouth at bedtime as needed for sleep.   No current facility-administered medications for this visit. (Other)      REVIEW OF SYSTEMS: ROS    Positive for: Endocrine   Last edited by Elyse Jarvis on 08/05/2020 10:52 AM. (History)       ALLERGIES No Known Allergies  PAST MEDICAL HISTORY Past Medical History:  Diagnosis Date  . Coronary artery disease 06/04/2009   stenting to the mid LAD with Xience 2.5 x 32 and overlapping 2.5 x 12 mm DES, [place], [place]  . Diabetes mellitus without complication (HCC)   . Hyperlipidemia   . Hypertension    Past Surgical History:  Procedure Laterality Date  . CARDIAC CATHETERIZATION  06/04/2009   Stent to LAD in [place], [place]  . REPLACEMENT TOTAL KNEE     bOTH  . ROTATOR CUFF REPAIR Bilateral     FAMILY HISTORY Family  History  Problem Relation Age of Onset  . Leukemia Mother   . Heart attack Brother   . Lung cancer Brother     SOCIAL HISTORY Social History   Tobacco Use  . Smoking status: Never Smoker  . Smokeless tobacco: Never Used  Vaping Use  . Vaping Use: Never used  Substance Use Topics  . Alcohol use: Yes    Comment: Drinks Socially  . Drug use: Never         OPHTHALMIC EXAM: Base Eye Exam    Visual Acuity (Snellen - Linear)      Right Left   Dist cc HM 20/20   Correction: Glasses       Tonometry (Tonopen, 10:57 AM)      Right Left   Pressure 11 14       Pupils      Pupils Dark Light Shape React APD   Right PERRL 3 3 Round Minimal None   Left PERRL 3 2 Round Slow None       Visual Fields (Counting fingers)      Left Right    Full    Restrictions  Total  superior temporal, superior nasal deficiencies       Neuro/Psych    Oriented x3: Yes   Mood/Affect: Normal       Dilation    Both eyes: 1.0% Mydriacyl, 2.5% Phenylephrine @ 10:57 AM        Slit Lamp and Fundus Exam    External Exam      Right Left   External Normal Normal       Slit Lamp Exam      Right Left   Lids/Lashes Normal Normal   Conjunctiva/Sclera White and quiet White and quiet   Cornea Clear Clear   Anterior Chamber Deep and quiet Deep and quiet   Iris Round and reactive Round and reactive   Lens Centered posterior chamber intraocular lens Centered posterior chamber intraocular lens   Anterior Vitreous Normal Normal       Fundus Exam      Right Left   Posterior Vitreous Posterior vitreous detachment    Disc 3+ Pallor    C/D Ratio 0.95 0.75   Macula Normal    Vessels Old central retinal artery occlusion right eye with thin attenuated arteries, no complications nor neovascularization    Periphery Solitary peripheral hemorrhage temporally,, no neovascularization right eye           IMAGING AND PROCEDURES  Imaging and Procedures for 08/05/20  Color Fundus Photography Optos - OU - Both Eyes       Right Eye Progression has been stable. Disc findings include increased cup to disc ratio, pallor. Macula : normal observations. Vessels : attenuated. Periphery : normal observations.   Left Eye Progression has been stable. Disc findings include increased cup to disc ratio. Macula : normal observations. Vessels : normal observations. Periphery : normal observations.   Notes No active retinopathy in either eye, optic nerve pallor stable from old retinal artery occlusion OD                ASSESSMENT/PLAN:  Central retinal artery occlusion of right eye Patient asked to return promptly for new visual difficulties.  Plan the patient should darkness or symptoms of amaurosis fugax develop in the left eye, he is to proceed promptly first to the emergency  room and ask for stroke protocol evaluation.  He has no driver, he should call EMT services  ICD-10-CM   1. Central retinal artery occlusion of right eye  H34.11 Color Fundus Photography Optos - OU - Both Eyes  2. Optic atrophy, right eye  H47.20 Color Fundus Photography Optos - OU - Both Eyes    1.  History of central retinal artery occlusion right eye, with no signs of complications.  2.  3.  Ophthalmic Meds Ordered this visit:  No orders of the defined types were placed in this encounter.      Return in about 1 year (around 08/05/2021) for DILATE OU, COLOR FP.  There are no Patient Instructions on file for this visit.   Explained the diagnoses, plan, and follow up with the patient and they expressed understanding.  Patient expressed understanding of the importance of proper follow up care.   Alford Highland Robi Dewolfe M.D. Diseases & Surgery of the Retina and Vitreous [facility] 08/05/20     Abbreviations: M myopia (nearsighted); A astigmatism; H hyperopia (farsighted); P presbyopia; Mrx spectacle prescription;  CTL contact lenses; OD right eye; OS left eye; OU both eyes  XT exotropia; ET esotropia; PEK punctate epithelial keratitis; PEE punctate epithelial erosions; DES dry eye syndrome; MGD meibomian gland dysfunction; ATs artificial tears; PFAT's preservative free artificial tears; NSC nuclear sclerotic cataract; PSC posterior subcapsular cataract; ERM epi-retinal membrane; PVD posterior vitreous detachment; RD retinal detachment; DM diabetes mellitus; DR diabetic retinopathy; NPDR non-proliferative diabetic retinopathy; PDR proliferative diabetic retinopathy; CSME clinically significant macular edema; DME diabetic macular edema; dbh dot blot hemorrhages; CWS cotton wool spot; POAG primary open angle glaucoma; C/D cup-to-disc ratio; HVF humphrey visual field; GVF goldmann visual field; OCT optical coherence tomography; IOP intraocular pressure; BRVO Branch retinal  vein occlusion; CRVO central retinal vein occlusion; CRAO central retinal artery occlusion; BRAO branch retinal artery occlusion; RT retinal tear; SB scleral buckle; PPV pars plana vitrectomy; VH Vitreous hemorrhage; PRP panretinal laser photocoagulation; IVK intravitreal kenalog; VMT vitreomacular traction; MH Macular hole;  NVD neovascularization of the disc; NVE neovascularization elsewhere; AREDS age related eye disease study; ARMD age related macular degeneration; POAG primary open angle glaucoma; EBMD epithelial/anterior basement membrane dystrophy; ACIOL anterior chamber intraocular lens; IOL intraocular lens; PCIOL posterior chamber intraocular lens; Phaco/IOL phacoemulsification with intraocular lens placement; PRK photorefractive keratectomy; LASIK laser assisted in situ keratomileusis; HTN hypertension; DM diabetes mellitus; COPD chronic obstructive pulmonary disease

## 2020-08-05 NOTE — Assessment & Plan Note (Signed)
Patient asked to return promptly for new visual difficulties.  Plan the patient should darkness or symptoms of amaurosis fugax develop in the left eye, he is to proceed promptly first to the emergency room and ask for stroke protocol evaluation.  He has no driver, he should call EMT services

## 2020-08-05 NOTE — Patient Instructions (Signed)
Patient requested to contact EMT services to proceed emergency room promptly immediately if new onset darkening vision were to develop in the left eye

## 2020-08-10 ENCOUNTER — Telehealth: Payer: Self-pay

## 2020-08-10 NOTE — Telephone Encounter (Signed)
Called pt and resch for 11/4

## 2020-08-10 NOTE — Telephone Encounter (Signed)
Patient called he wants to reschedule his appointment with Dr.Newton call back:[phone].

## 2020-08-19 ENCOUNTER — Ambulatory Visit
Admission: RE | Admit: 2020-08-19 | Discharge: 2020-08-19 | Disposition: A | Discharge status: Home / Self Care | Payer: Medicare Other | Source: Ambulatory Visit | Attending: Physical Medicine and Rehabilitation | Admitting: Physical Medicine and Rehabilitation

## 2020-08-19 ENCOUNTER — Other Ambulatory Visit: Payer: Self-pay

## 2020-08-19 DIAGNOSIS — M961 Postlaminectomy syndrome, not elsewhere classified: Secondary | ICD-10-CM

## 2020-08-19 DIAGNOSIS — M5416 Radiculopathy, lumbar region: Secondary | ICD-10-CM

## 2020-08-19 IMAGING — MR MR LUMBAR SPINE W/O CM
4 of 5 series · 24 of 48 positions shown · non-contrast
Comparison: Plain films [DATE]

CLINICAL DATA: Low back pain.

EXAM:
MRI LUMBAR SPINE WITHOUT CONTRAST
TECHNIQUE: Multiplanar, multisequence MR imaging of the lumbar spine was
performed. No intravenous contrast was administered.

[Series 3: T2 post-contrast · sagittal · 4.0mm · 0.53mm/px · 6 of 16 slices shown]
[im 1/16]
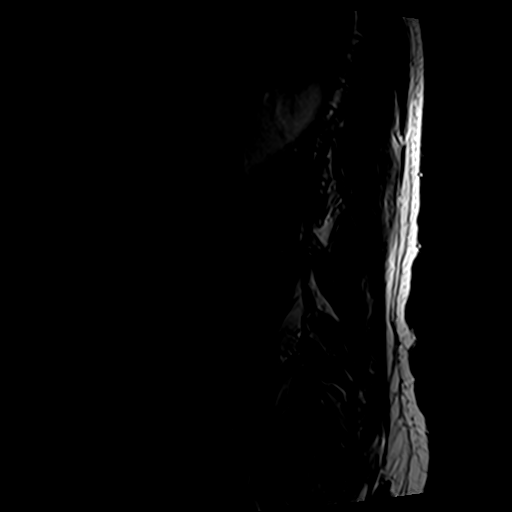
[im 4/16]
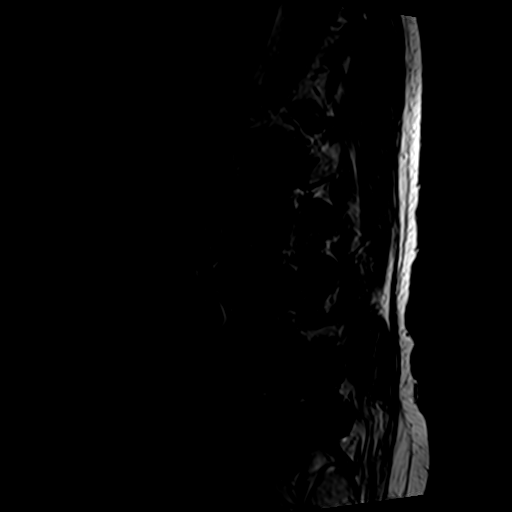
[im 7/16]
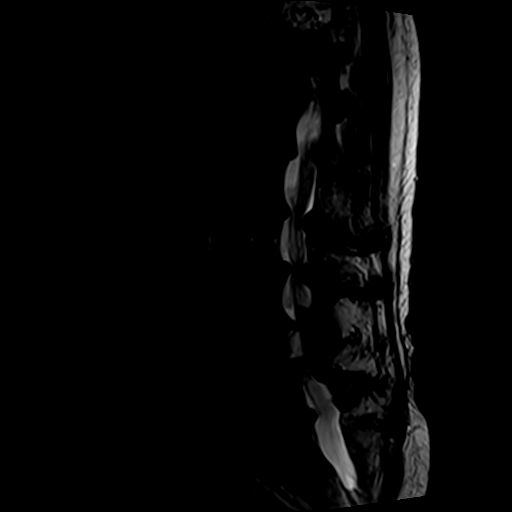
[im 10/16]
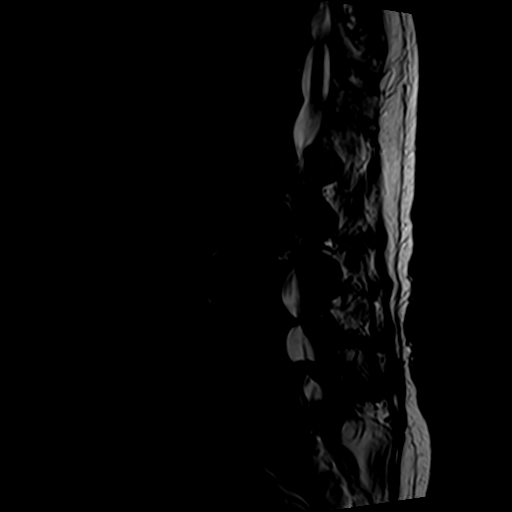
[im 13/16]
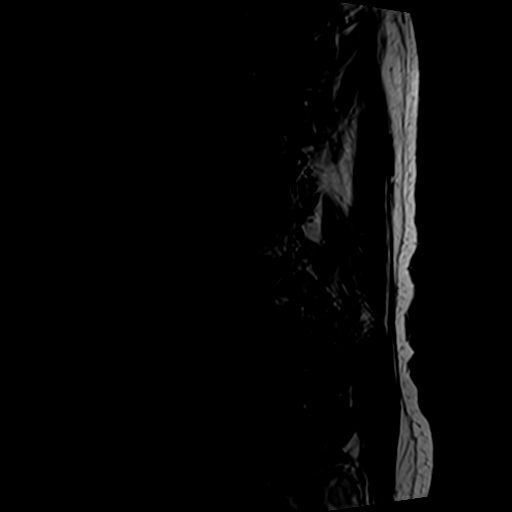
[im 16/16]
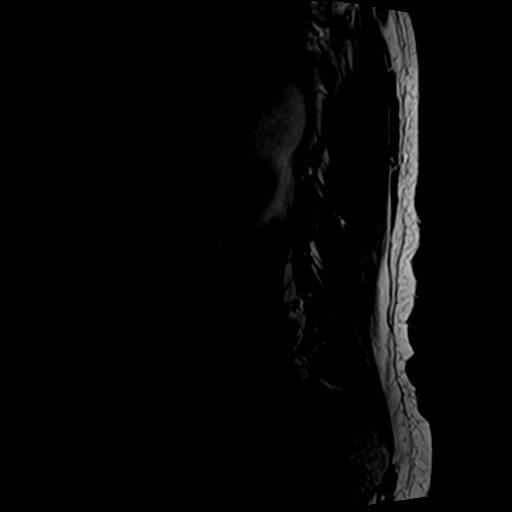

[Series 5: T1 · sagittal · 4.0mm · 0.53mm/px · 7 of 16 slices shown (1 of 2)]
[im 1/16]
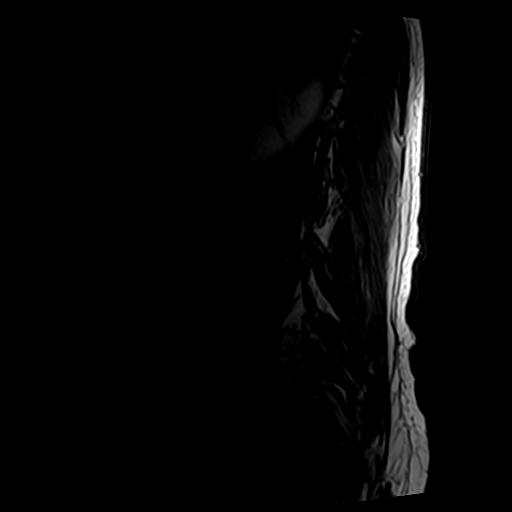
[im 3/16]
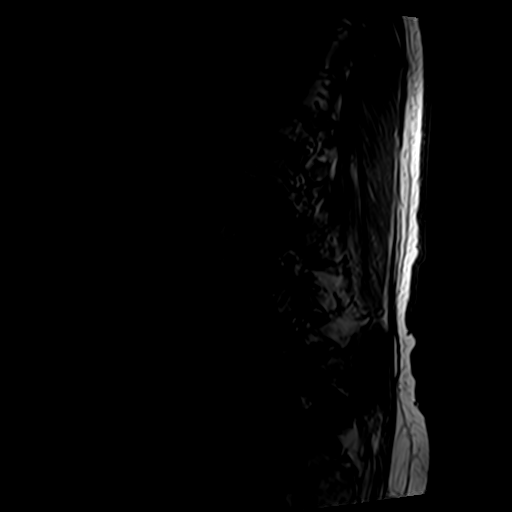
[im 6/16]
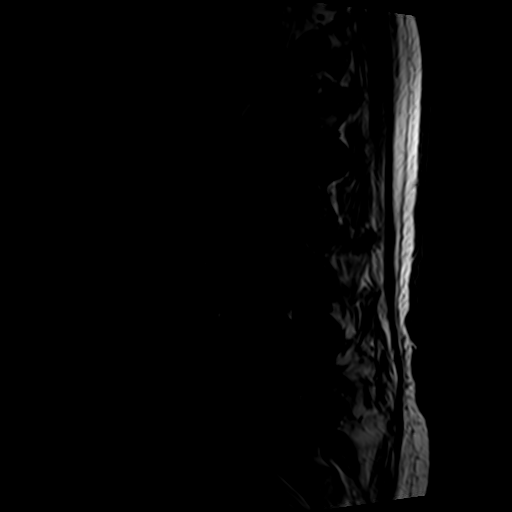
[im 8/16]
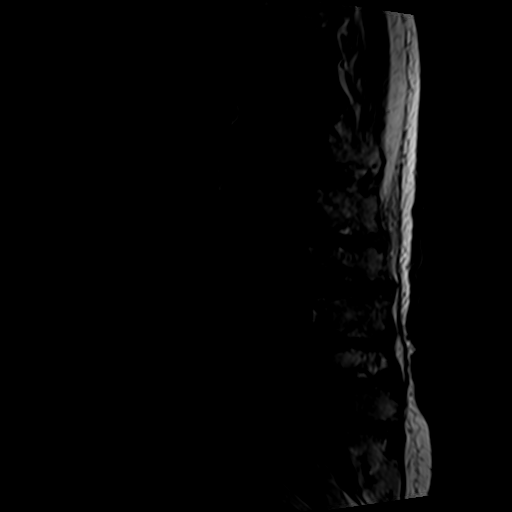
[im 11/16]
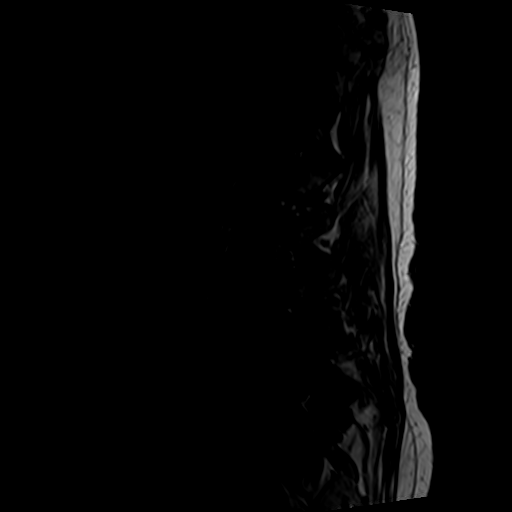
[im 13/16]
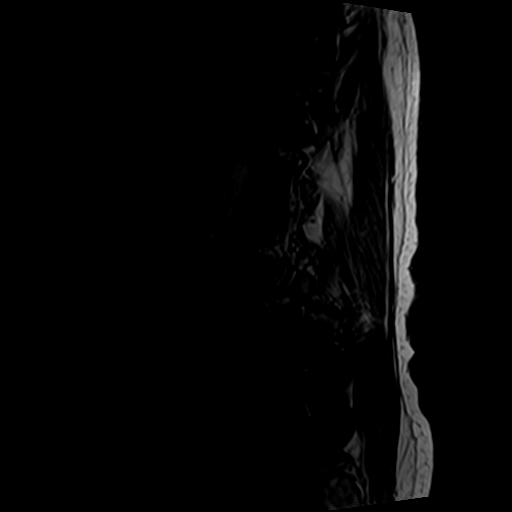
[im 16/16]
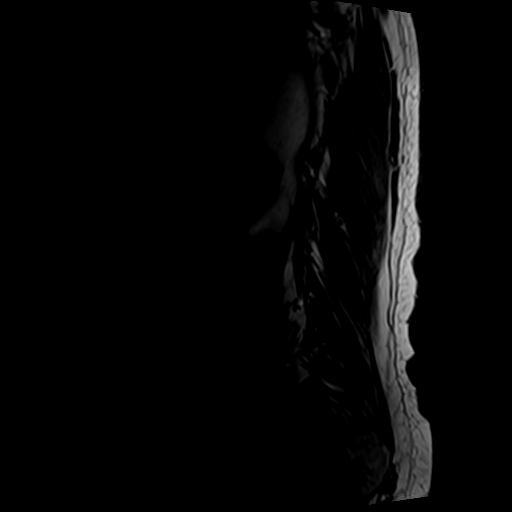

[Series 6: T2 · axial · 4.0mm · 0.70mm/px · z∈[-72,+114]mm · 8 of 33 slices shown]
[im 1/33]
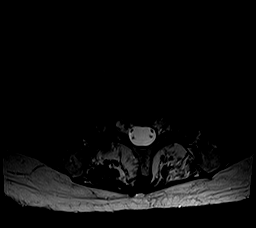
[im 5/33]
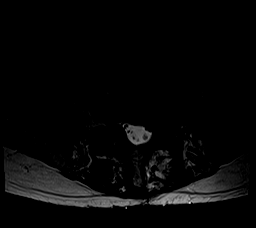
[im 10/33]
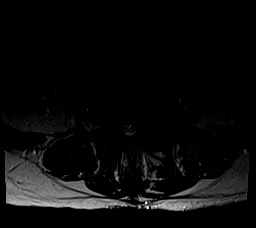
[im 15/33]
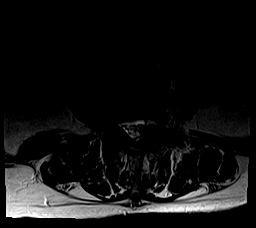
[im 18/33]
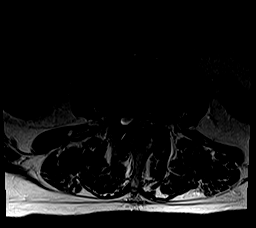
[im 23/33]
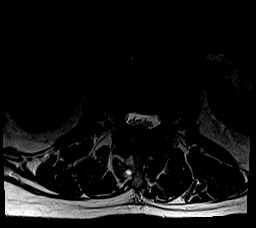
[im 28/33]
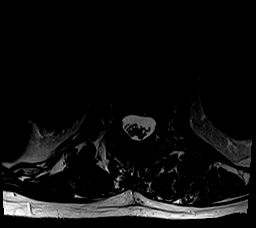
[im 33/33]
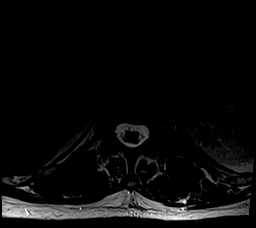

[Series 7: T1 · axial · 4.0mm · 0.35mm/px · z∈[-51,+88]mm · 3 of 33 slices shown (2 of 2)]
[im 5/33]
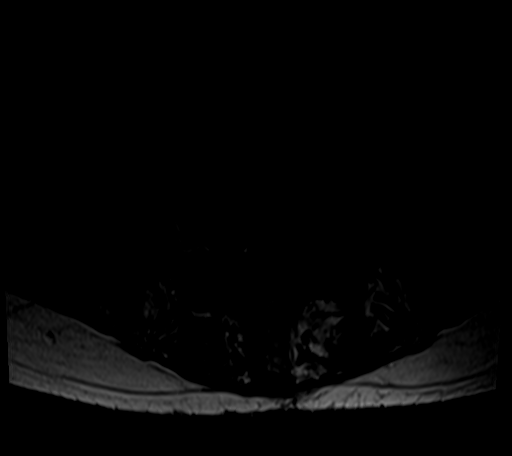
[im 18/33]
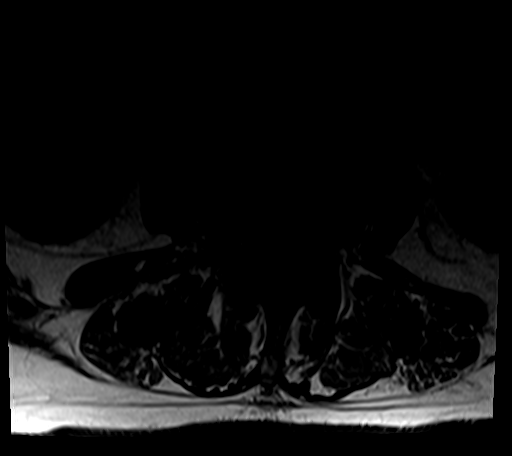
[im 28/33]
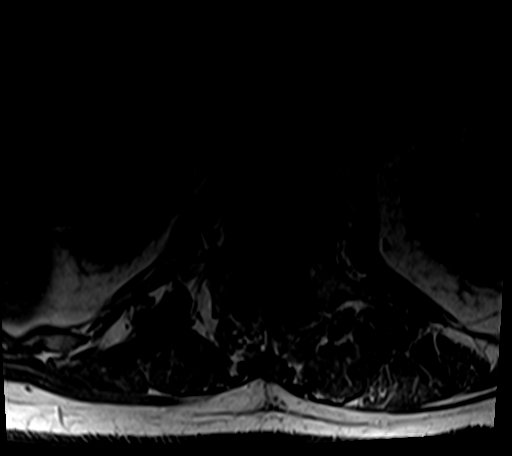

[24 of 48 positions shown; findings below may reference images not displayed]

FINDINGS: Segmentation:  Standard.

Alignment: Levoconvex scoliosis of the lumbar spine. Grade 1
anterolisthesis of L2 over L3 and L4 over L5 with minimal
retrolisthesis of L5 over S1.

Vertebrae:  No fracture, evidence of discitis, or bone lesion.

Conus medullaris and cauda equina: Conus extends to the L1-2 level.
Conus and cauda equina appear normal.

Paraspinal and other soft tissues: Negative.

Disc levels:

T12-L1: Disc bulge with associated osteophytic component, facet
degenerative changes and ligamentum flavum redundancy resulting in
mild spinal canal stenosis and moderate to severe bilateral neural
foraminal narrowing.

L1-2: Disc bulge, facet degenerative changes and ligamentum flavum
redundancy resulting in mild spinal canal stenosis and moderate
bilateral neural foraminal narrowing.

L2-3: Loss of disc height, disc bulge, facet degenerative changes
and prominent redundancy of the ligamentum flavum resulting in
moderate spinal canal stenosis and mild bilateral neural foraminal
narrowing.

L3-4: Prominent loss of disc height, disc bulge with associated
osteophytic component, facet degenerative changes and ligamentum
flavum redundancy resulting in moderate spinal canal stenosis,
moderate right and mild left neural foraminal narrowing.

L4-5: Prominent loss disc height, endplate ridging, prominent
hypertrophic facet degenerative changes and ligamentum flavum
redundancy resulting in severe spinal canal stenosis and severe
bilateral neural foraminal narrowing.

L5-S1: Disc bulge, facet degenerative changes and ligamentum flavum
redundancy resulting in narrowing of the bilateral subarticular
zones, left greater than right and moderate bilateral neural
foraminal narrowing.
IMPRESSION: 1. Multilevel degenerative changes of the lumbar spine as described
above, worst at L4-5 where there is severe spinal canal stenosis and
severe bilateral neural foraminal narrowing.
2. Moderate spinal canal stenosis at L2-3 and L3-4.
3. Moderate bilateral neural foraminal narrowing at L5-S1 with
narrowing of the bilateral subarticular zones at this level.
4. Moderate to severe bilateral neural foraminal narrowing at
T12-L1.

## 2020-08-20 ENCOUNTER — Telehealth: Payer: Self-pay | Admitting: Physical Medicine and Rehabilitation

## 2020-08-20 NOTE — Telephone Encounter (Signed)
Left message #1 to schedule OV for MRI review. This needs to be scheduled prior to his appointment for injection which is currently scheduled for 11/4.

## 2020-08-20 NOTE — Telephone Encounter (Signed)
-----   Message from Tyrell Antonio, MD sent at 08/20/2020 10:37 AM EDT ----- Regarding: f/up MRI Needs follow up to discuss severe stenosis.

## 2020-08-23 ENCOUNTER — Telehealth: Payer: Self-pay | Admitting: Physical Medicine and Rehabilitation

## 2020-08-23 NOTE — Telephone Encounter (Signed)
Scheduled for 10/27 at 0900 for MRI review.

## 2020-08-23 NOTE — Telephone Encounter (Signed)
Patient called. Would like to talk to Sabina. His call back number is [phone]

## 2020-09-01 ENCOUNTER — Other Ambulatory Visit: Payer: Self-pay

## 2020-09-01 ENCOUNTER — Encounter: Payer: Self-pay | Admitting: Physical Medicine and Rehabilitation

## 2020-09-01 ENCOUNTER — Ambulatory Visit (INDEPENDENT_AMBULATORY_CARE_PROVIDER_SITE_OTHER): Payer: Medicare Other | Admitting: Physical Medicine and Rehabilitation

## 2020-09-01 VITALS — BP 104/64 | HR 85

## 2020-09-01 DIAGNOSIS — M48062 Spinal stenosis, lumbar region with neurogenic claudication: Secondary | ICD-10-CM

## 2020-09-01 DIAGNOSIS — I2510 Atherosclerotic heart disease of native coronary artery without angina pectoris: Secondary | ICD-10-CM

## 2020-09-01 DIAGNOSIS — M5416 Radiculopathy, lumbar region: Secondary | ICD-10-CM

## 2020-09-01 DIAGNOSIS — M47816 Spondylosis without myelopathy or radiculopathy, lumbar region: Secondary | ICD-10-CM | POA: Diagnosis not present

## 2020-09-01 DIAGNOSIS — M961 Postlaminectomy syndrome, not elsewhere classified: Secondary | ICD-10-CM

## 2020-09-01 NOTE — Progress Notes (Signed)
Here for MRI review. Last injection did not help. Low back pain, buttock pain, and pain down sides of legs. Numeric Pain Rating Scale and Functional Assessment Average Pain 5 Pain Right Now 5 My pain is constant Pain is worse with: standing Pain improves with: rest   In the last MONTH (on 0-10 scale) has pain interfered with the following?  1. General activity like being  able to carry out your everyday physical activities such as walking, climbing stairs, carrying groceries, or moving a chair?  Rating(7)  2. Relation with others like being able to carry out your usual social activities and roles such as  activities at home, at work and in your community. Rating(7)  3. Enjoyment of life such that you have  been bothered by emotional problems such as feeling anxious, depressed or irritable?  Rating(8)

## 2020-09-02 ENCOUNTER — Ambulatory Visit (INDEPENDENT_AMBULATORY_CARE_PROVIDER_SITE_OTHER): Payer: Medicare Other | Admitting: Physical Medicine and Rehabilitation

## 2020-09-02 ENCOUNTER — Ambulatory Visit: Payer: Self-pay

## 2020-09-02 ENCOUNTER — Encounter: Payer: Self-pay | Admitting: Physical Medicine and Rehabilitation

## 2020-09-02 VITALS — BP 98/62 | HR 78

## 2020-09-02 DIAGNOSIS — M961 Postlaminectomy syndrome, not elsewhere classified: Secondary | ICD-10-CM | POA: Diagnosis not present

## 2020-09-02 DIAGNOSIS — M48062 Spinal stenosis, lumbar region with neurogenic claudication: Secondary | ICD-10-CM

## 2020-09-02 DIAGNOSIS — M5416 Radiculopathy, lumbar region: Secondary | ICD-10-CM | POA: Diagnosis not present

## 2020-09-02 MED ORDER — METHYLPREDNISOLONE ACETATE 80 MG/ML IJ SUSP
80.0000 mg | Freq: Once | INTRAMUSCULAR | Status: AC
  Administered 2020-09-02: 80 mg

## 2020-09-02 NOTE — Procedures (Signed)
Lumbosacral Transforaminal Epidural Steroid Injection - Sub-Pedicular Approach with Fluoroscopic Guidance  Patient: Jaime [name]      Date of Birth: 12/10/1930 MRN: [id number] PCP: Jarome Matin, MD      Visit Date: 09/02/2020   Universal Protocol:    Date/Time: 09/02/2020  Consent Given By: the patient  Position: PRONE  Additional Comments: Vital signs were monitored before and after the procedure. Patient was prepped and draped in the usual sterile fashion. The correct patient, procedure, and site was verified.   Injection Procedure Details:   Procedure diagnoses:  1. Lumbar radiculopathy   2. Post laminectomy syndrome      Meds Administered:  Meds ordered this encounter  Medications  . methylPREDNISolone acetate (DEPO-MEDROL) injection 80 mg    Laterality: Bilateral  Location/Site:  L4-L5  Needle size: 22 G  Needle type: Spinal  Needle Placement: Transforaminal  Findings:    -Comments: Excellent flow of contrast along the nerve, nerve root and into the epidural space.  Procedure Details: After squaring off the end-plates to get a true AP view, the C-arm was positioned so that an oblique view of the foramen as noted above was visualized. The target area is just inferior to the "nose of the scotty dog" or sub pedicular. The soft tissues overlying this structure were infiltrated with 2-3 ml. of 1% Lidocaine without Epinephrine.  The spinal needle was inserted toward the target using a "trajectory" view along the fluoroscope beam.  Under AP and lateral visualization, the needle was advanced so it did not puncture dura and was located close the 6 O'Clock position of the pedical in AP tracterory. Biplanar projections were used to confirm position. Aspiration was confirmed to be negative for CSF and/or blood. A 1-2 ml. volume of Isovue-250 was injected and flow of contrast was noted at each level. Radiographs were obtained for documentation purposes.   After  attaining the desired flow of contrast documented above, the above injectate was administered so that equal amounts of the injectate were placed at each foramen (level) into the transforaminal epidural space.   Additional Comments:  The patient tolerated the procedure well Dressing: 2 x 2 sterile gauze and Band-Aid    Post-procedure details: Patient was observed during the procedure. Post-procedure instructions were reviewed.  Patient left the clinic in stable condition.

## 2020-09-02 NOTE — Progress Notes (Signed)
   Numeric Pain Rating Scale and Functional Assessment Average Pain 3    In the last MONTH (on 0-10 scale) has pain interfered with the following?  1. General activity like being  able to carry out your everyday physical activities such as walking, climbing stairs, carrying groceries, or moving a chair?  Rating(8)   +Driver, -BT, -Dye Allergies.  

## 2020-09-07 ENCOUNTER — Ambulatory Visit: Payer: Medicare Other | Admitting: Cardiology

## 2020-09-09 ENCOUNTER — Encounter: Payer: Medicare Other | Admitting: Physical Medicine and Rehabilitation

## 2020-09-10 ENCOUNTER — Other Ambulatory Visit: Payer: Self-pay

## 2020-09-10 ENCOUNTER — Ambulatory Visit: Payer: Medicare Other | Admitting: Cardiology

## 2020-09-10 ENCOUNTER — Encounter: Payer: Self-pay | Admitting: Cardiology

## 2020-09-10 VITALS — BP 136/78 | HR 69 | Resp 16 | Ht 66.5 in | Wt 158.0 lb

## 2020-09-10 DIAGNOSIS — E1169 Type 2 diabetes mellitus with other specified complication: Secondary | ICD-10-CM

## 2020-09-10 DIAGNOSIS — E7800 Pure hypercholesterolemia, unspecified: Secondary | ICD-10-CM

## 2020-09-10 DIAGNOSIS — Z794 Long term (current) use of insulin: Secondary | ICD-10-CM

## 2020-09-10 DIAGNOSIS — I2510 Atherosclerotic heart disease of native coronary artery without angina pectoris: Secondary | ICD-10-CM

## 2020-09-10 NOTE — Progress Notes (Signed)
Primary Physician/Referring:  Leanna Battles, MD  Patient ID: Jaime [name], male    DOB: 11-Nov-1930, 86 y.o.   MRN: [id number]  Chief Complaint  Patient presents with  . Coronary Artery Disease  . Follow-up    6 month   HPI:    Jaime [name]  is a 86 y.o. Retired Pharmacist, community by profession, hypertension, hyperlipidemia, diabetes mellitus, coronary artery disease with PTCA and stenting to the mid LAD with Xience 2.5 x 32 and overlapping 2.5 x 12 mm DES placed on 06/04/2009.  Since then he has not had any recurrence of angina pectoris.  Moved from [place] to be closer to his daughter in [place] since March 2021.Marland Kitchen  He also has history of PAD from diabetes mellitus.  Remains active.  His wife has MS and he is the active caregiver.  He has occasional night cramps and non limiting. No dyspnea or PND or orthopnea. No dizziness or syncope.  Past Medical History:  Diagnosis Date  . Coronary artery disease 06/04/2009   stenting to the mid LAD with Xience 2.5 x 32 and overlapping 2.5 x 12 mm DES, [place], [place]  . Diabetes mellitus without complication ([place])   . Hyperlipidemia   . Hypertension    Past Surgical History:  Procedure Laterality Date  . CARDIAC CATHETERIZATION  06/04/2009   Stent to LAD in [place], [place]  . REPLACEMENT TOTAL KNEE     bOTH  . ROTATOR CUFF REPAIR Bilateral    Family History  Problem Relation Age of Onset  . Leukemia Mother   . Heart attack Brother   . Lung cancer Brother     Social History   Tobacco Use  . Smoking status: Never Smoker  . Smokeless tobacco: Never Used  Substance Use Topics  . Alcohol use: Yes    Comment: Drinks Socially   Marital Status:    ROS  Review of Systems  Constitutional: Negative for weight gain.  Cardiovascular: Negative for dyspnea on exertion, leg swelling and syncope.  Respiratory: Negative for shortness of breath.   Musculoskeletal: Positive for back pain and muscle cramps (at night). Negative for joint  swelling.   Objective  Blood pressure 136/78, pulse 69, resp. rate 16, height 5' 6.5" (1.689 m), weight 158 lb (71.7 kg), SpO2 97 %.  Vitals with BMI 09/10/2020 09/02/2020 09/01/2020  Height 5' 6.5" - -  Weight 158 lbs - -  BMI 37.04 - -  Systolic 888 98 916  Diastolic 78 62 64  Pulse 69 78 85     Physical Exam Constitutional:      General: He is not in acute distress.    Appearance: He is well-developed.  Cardiovascular:     Rate and Rhythm: Normal rate and regular rhythm.     Pulses:          Carotid pulses are 2+ on the right side and 2+ on the left side.      Popliteal pulses are 2+ on the right side and 2+ on the left side.       Dorsalis pedis pulses are 1+ on the right side and 1+ on the left side.       Posterior tibial pulses are 1+ on the right side and 1+ on the left side.     Heart sounds: No murmur heard.  No gallop.      Comments: S1 is normal, S2 is widely split with physiologic variation.   No leg edema. No JVD.   Pulmonary:  Effort: Pulmonary effort is normal. No accessory muscle usage or respiratory distress.     Breath sounds: Normal breath sounds.  Abdominal:     Palpations: Abdomen is soft.    Laboratory examination:   No results for input(s): NA, K, CL, CO2, GLUCOSE, BUN, CREATININE, CALCIUM, GFRNONAA, GFRAA in the last 8760 hours. CrCl cannot be calculated (No successful lab value found.).  No flowsheet data found. No flowsheet data found. Lipid Panel  No results found for: CHOL, TRIG, HDL, CHOLHDL, VLDL, LDLCALC, LDLDIRECT HEMOGLOBIN A1C No results found for: HGBA1C, MPG TSH No results for input(s): TSH in the last 8760 hours.  External labs:    None  Medications and allergies  No Known Allergies  Current Outpatient Medications on File Prior to Visit  Medication Sig Dispense Refill  . aspirin EC 81 MG tablet Take 81 mg by mouth daily.    . diazepam (VALIUM) 5 MG tablet Take 1 by mouth 1 hour  pre-procedure with very light food. May  take 2nd tablet if needed. 2 tablet 0  . gabapentin (NEURONTIN) 300 MG capsule Take 300 mg by mouth daily.    Marland Kitchen ibuprofen (ADVIL) 400 MG tablet Take 400 mg by mouth every 6 (six) hours as needed.    Marland Kitchen JARDIANCE 25 MG TABS tablet Take 25 mg by mouth daily.    Marland Kitchen levothyroxine (SYNTHROID) 175 MCG tablet Take 175 mcg by mouth every other day.    . Metoprolol Succinate 25 MG CS24 Take 25 mg by mouth.    . mirabegron ER (MYRBETRIQ) 50 MG TB24 tablet Take 50 mg by mouth daily.    . rosuvastatin (CRESTOR) 20 MG tablet Take 1 tablet by mouth at bedtime.    Tyler Aas FLEXTOUCH 100 UNIT/ML FlexTouch Pen Inject 17 Units into the skin daily.    . Vitamin D, Cholecalciferol, 50 MCG (2000 UT) CAPS Take 1 capsule by mouth daily.    . vitamin E 180 MG (400 UNITS) capsule Take 400 Units by mouth daily.    Marland Kitchen zolpidem (AMBIEN) 10 MG tablet Take 10 mg by mouth at bedtime as needed for sleep.    . nitroGLYCERIN (NITROSTAT) 0.4 MG SL tablet Place 1 tablet (0.4 mg total) under the tongue every 5 (five) minutes as needed for up to 25 days for chest pain. (Patient not taking: Reported on 03/23/2020) 25 tablet 3  . tamsulosin (FLOMAX) 0.4 MG CAPS capsule Take 1 capsule by mouth daily.     Current Facility-Administered Medications on File Prior to Visit  Medication Dose Route Frequency Provider Last Rate Last Admin  . methylPREDNISolone acetate (DEPO-MEDROL) injection 80 mg  80 mg Other Once Magnus Sinning, MD        Radiology:   No results found.  Cardiac Studies:  Echocardiogram 04/20/2020: Left ventricle cavity is normal in size. Moderate concentric hypertrophy of the left ventricle. Normal global wall motion. Normal LV systolic function with EF 59%. Doppler evidence of grade I (impaired) diastolic dysfunction, normal LAP. Calculated EF 59%. Left atrial cavity is severely dilated at 51 cc/m2. No aortic valve regurgitation. Mild aortic valve leaflet calcification. Mildly restricted aortic valve leaflets. Mild  mitral valve leaflet calcification. Mildly restricted mitral valve leaflets. Mild (Grade I) mitral regurgitation. Mild tricuspid regurgitation. Estimated pulmonary artery systolic pressure is 25 mmHg.   EKG  EKG 09/10/2020: Normal sinus rhythm at rate of 68 bpm, left atrial enlargement, left axis deviation, left anterior fascicular block.  Cannot exclude inferior infarct old.  Right bundle branch  block.  Low-voltage complexes.  Pulmonary disease pattern. No significant change from EKG 03/15/2020   Assessment     ICD-10-CM   1. Coronary artery disease involving native coronary artery of native heart without angina pectoris  I25.10 EKG 12-Lead    CBC    Lipid Panel With LDL/HDL Ratio    CMP14+EGFR  2. Pure hypercholesterolemia  E78.00 Lipid Panel With LDL/HDL Ratio  3. Type 2 diabetes mellitus with other specified complication, with long-term current use of insulin (HCC)  E11.69    Z79.4     No orders of the defined types were placed in this encounter.   Medications Discontinued During This Encounter  Medication Reason  . 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  Since then he has not had any recurrence of angina pectoris.  Recently moved from [place] to be closer to his daughter in [place].  I again reviewed the results of the echocardiogram with the patient.  He remains asymptomatic and although 86 years of age continues to remain active.  No change in physical exam, blood pressure is well controlled.  He does not her labs, it was left to my discretion by his PCP for management of his cardiac risk factors.  I will obtain lipid profile testing along with CBC and CMP.  I will forward a copy to Dr.  Leanna Battles for his records.  I will see the patient back in 6 months for follow-up and if he remains stable, on annual basis.   Adrian Prows, MD, [facility] 09/10/2020, 5:27 PM Office: [phone] Pager: [phone]

## 2020-09-13 NOTE — Telephone Encounter (Signed)
From pt

## 2020-09-16 ENCOUNTER — Encounter: Payer: Medicare Other | Admitting: Physical Medicine and Rehabilitation

## 2020-10-04 ENCOUNTER — Encounter: Payer: Self-pay | Admitting: Physical Medicine and Rehabilitation

## 2020-10-04 NOTE — Progress Notes (Signed)
Jaime [name] - 86 y.o. male MRN [id number]  Date of birth: 1931/09/08  Office Visit Note: Visit Date: 09/02/2020 PCP: Jarome Matin, MD Referred by: Jarome Matin, MD  Subjective: Chief Complaint  Patient presents with  . Lower Back - Pain  . Left Leg - Pain  . Right Leg - Pain   HPI:  Jaime [name] is a 86 y.o. male who comes in today at the request of Dr. Naaman Plummer for planned Bilateral L4-L5 Lumbar epidural steroid injection with fluoroscopic guidance.  The patient has failed conservative care including home exercise, medications, time and activity modification.  This injection will be diagnostic and hopefully therapeutic.  Please see requesting physician notes for further details and justification.  MRI reviewed with images and spine model.  MRI reviewed in the note below.  Despite being seen recently the patient does not have a driver today for the injection.  This is something that we do require for spinal epidural injections in particular do to anesthesia around nerve roots and in particular in the spine.  He does have difficulty getting a ride although he does live [place] and I do think he can get a ride from them to a doctor's appointment.  Nonetheless today we will let him sit a couple hours after the injection.  In the future he will need to have transportation.  ROS Otherwise per HPI.  Assessment & Plan: Visit Diagnoses:  1. Lumbar radiculopathy   2. Post laminectomy syndrome   3. Spinal stenosis of lumbar region with neurogenic claudication     Plan: No additional findings.   Meds & Orders:  Meds ordered this encounter  Medications  . methylPREDNISolone acetate (DEPO-MEDROL) injection 80 mg    Orders Placed This Encounter  Procedures  . XR C-ARM NO REPORT  . Epidural Steroid injection    Follow-up: Return if symptoms worsen or fail to improve.   Procedures: No procedures performed  Lumbosacral Transforaminal Epidural Steroid Injection -  Sub-Pedicular Approach with Fluoroscopic Guidance  Patient: Jaime [name]      Date of Birth: 05-Feb-1931 MRN: [id number] PCP: Jarome Matin, MD      Visit Date: 09/02/2020   Universal Protocol:    Date/Time: 09/02/2020  Consent Given By: the patient  Position: PRONE  Additional Comments: Vital signs were monitored before and after the procedure. Patient was prepped and draped in the usual sterile fashion. The correct patient, procedure, and site was verified.   Injection Procedure Details:   Procedure diagnoses:  1. Lumbar radiculopathy   2. Post laminectomy syndrome      Meds Administered:  Meds ordered this encounter  Medications  . methylPREDNISolone acetate (DEPO-MEDROL) injection 80 mg    Laterality: Bilateral  Location/Site:  L4-L5  Needle size: 22 G  Needle type: Spinal  Needle Placement: Transforaminal  Findings:    -Comments: Excellent flow of contrast along the nerve, nerve root and into the epidural space.  Procedure Details: After squaring off the end-plates to get a true AP view, the C-arm was positioned so that an oblique view of the foramen as noted above was visualized. The target area is just inferior to the "nose of the scotty dog" or sub pedicular. The soft tissues overlying this structure were infiltrated with 2-3 ml. of 1% Lidocaine without Epinephrine.  The spinal needle was inserted toward the target using a "trajectory" view along the fluoroscope beam.  Under AP and lateral visualization, the needle was advanced so it did not puncture dura and was  located close the 6 O'Clock position of the pedical in AP tracterory. Biplanar projections were used to confirm position. Aspiration was confirmed to be negative for CSF and/or blood. A 1-2 ml. volume of Isovue-250 was injected and flow of contrast was noted at each level. Radiographs were obtained for documentation purposes.   After attaining the desired flow of contrast documented above,  the above injectate was administered so that equal amounts of the injectate were placed at each foramen (level) into the transforaminal epidural space.   Additional Comments:  The patient tolerated the procedure well Dressing: 2 x 2 sterile gauze and Band-Aid    Post-procedure details: Patient was observed during the procedure. Post-procedure instructions were reviewed.  Patient left the clinic in stable condition.      Clinical History: MRI LUMBAR SPINE WITHOUT CONTRAST  TECHNIQUE: Multiplanar, multisequence MR imaging of the lumbar spine was performed. No intravenous contrast was administered.  COMPARISON:  Plain films Mar 23, 2020  FINDINGS: Segmentation:  Standard.  Alignment: Levoconvex scoliosis of the lumbar spine. Grade 1 anterolisthesis of L2 over L3 and L4 over L5 with minimal retrolisthesis of L5 over S1.  Vertebrae:  No fracture, evidence of discitis, or bone lesion.  Conus medullaris and cauda equina: Conus extends to the L1-2 level. Conus and cauda equina appear normal.  Paraspinal and other soft tissues: Negative.  Disc levels:  T12-L1: Disc bulge with associated osteophytic component, facet degenerative changes and ligamentum flavum redundancy resulting in mild spinal canal stenosis and moderate to severe bilateral neural foraminal narrowing.  L1-2: Disc bulge, facet degenerative changes and ligamentum flavum redundancy resulting in mild spinal canal stenosis and moderate bilateral neural foraminal narrowing.  L2-3: Loss of disc height, disc bulge, facet degenerative changes and prominent redundancy of the ligamentum flavum resulting in moderate spinal canal stenosis and mild bilateral neural foraminal narrowing.  L3-4: Prominent loss of disc height, disc bulge with associated osteophytic component, facet degenerative changes and ligamentum flavum redundancy resulting in moderate spinal canal stenosis, moderate right and mild left  neural foraminal narrowing.  L4-5: Prominent loss disc height, endplate ridging, prominent hypertrophic facet degenerative changes and ligamentum flavum redundancy resulting in severe spinal canal stenosis and severe bilateral neural foraminal narrowing.  L5-S1: Disc bulge, facet degenerative changes and ligamentum flavum redundancy resulting in narrowing of the bilateral subarticular zones, left greater than right and moderate bilateral neural foraminal narrowing.  IMPRESSION: 1. Multilevel degenerative changes of the lumbar spine as described above, worst at L4-5 where there is severe spinal canal stenosis and severe bilateral neural foraminal narrowing. 2. Moderate spinal canal stenosis at L2-3 and L3-4. 3. Moderate bilateral neural foraminal narrowing at L5-S1 with narrowing of the bilateral subarticular zones at this level. 4. Moderate to severe bilateral neural foraminal narrowing at T12-L1.   Electronically Signed   By: Baldemar Lenis M.D.   On: 08/19/2020 11:59     Objective:  VS:  HT:    WT:   BMI:     BP:98/62  HR:78bpm  TEMP: ( )  RESP:  Physical Exam Constitutional:      General: He is not in acute distress.    Appearance: Normal appearance. He is not ill-appearing.  HENT:     Head: Normocephalic and atraumatic.     Right Ear: External ear normal.     Left Ear: External ear normal.  Eyes:     Extraocular Movements: Extraocular movements intact.  Cardiovascular:     Rate and Rhythm: Normal rate.  Pulses: Normal pulses.  Abdominal:     General: There is no distension.     Palpations: Abdomen is soft.  Musculoskeletal:        General: No tenderness or signs of injury.     Right lower leg: No edema.     Left lower leg: No edema.     Comments: Patient has good distal strength without clonus.  Skin:    Findings: No erythema or rash.  Neurological:     General: No focal deficit present.     Mental Status: He is alert and  oriented to person, place, and time.     Sensory: No sensory deficit.     Motor: No weakness or abnormal muscle tone.     Coordination: Coordination normal.  Psychiatric:        Mood and Affect: Mood normal.        Behavior: Behavior normal.      Imaging: No results found.

## 2020-10-04 NOTE — Progress Notes (Signed)
Jaime [name] - 86 y.o. male MRN [id number]  Date of birth: February 04, 1931  Office Visit Note: Visit Date: 09/01/2020 PCP: Jarome Matin, MD Referred by: Jarome Matin, MD  Subjective: Chief Complaint  Patient presents with  . Lower Back - Pain   HPI: Jaime [name] is a 86 y.o. male who comes in today For evaluation and management along with MRI review for chronic worsening severe low back pain with bilateral buttock pain and pain down the sides of the legs and more of an L for distribution.  He rates his pain as a 5 out of 10 on average worse with standing and moving.  Pain is constant however it most of the time.  He is really had a flareup recently over the last several months.  Last injection did not seem to help him at all.  Brief review is that the patient was having injections every 3 to 4 months in [place].  These were left L2-3 interlaminar injections using Kenalog.  The patient is a retired Education officer, community and he does have some medical knowledge but unfortunately not a lot of knowledge about spine and treatments that leads to become to this point hopefully the current level of science noted.  The folks in [place] were doing what some people do which is is doing an injection on a scheduled basis from a pretty simple interlaminar approach really with the medication that has a label warning not to use in the epidural space.  Their practice at this point really seems nonstandard to me and is been hard to relate that to the patient because he was getting relief evidently with their injections.  So we have come to a situation where we have tried 1 injection essentially what they did except using Depo-Medrol and it helped pretty greatly the first time we did it but the second time we did it it did not help.  Reviewing those images of the procedure itself they do look well-placed although he is a difficult injection at that level do to arthritic changes.  He had not had recent MRI of the lumbar spine  and so I did update that.  Just as high had figured he has worsening stenosis than his last imaging would have let us know.  MRI is reviewed with the patient today with spine models and imaging.  It is reviewed below in the notes.  He basically has a severe multifactorial stenosis at L4-5 with moderate at L2-3 and L3-4.  He has by foraminal stenosis at L5-S1.  He has degenerative changes throughout.  His case is complicated to by the fact that he takes care of his wife and has had some limited resources here.  He does reside I believe that [place].  He also has a good primary care physician Dr. Dossie Arbour.  He continues to use ibuprofen as well as gabapentin.  He continues with home exercises and has multiple rounds of physical therapy in his past but not recently.  Review of Systems  Musculoskeletal: Positive for back pain.       Radicular hip and leg pain  All other systems reviewed and are negative.  Otherwise per HPI.  Assessment & Plan: Visit Diagnoses:  1. Spinal stenosis of lumbar region with neurogenic claudication   2. Lumbar radiculopathy   3. Spondylosis without myelopathy or radiculopathy, lumbar region   4. Post laminectomy syndrome     Plan: Findings:  Chronic worsening severe low back pain with bilateral radicular type pain and more  of an L4 distribution into the hips and thighs.  Symptoms are very consistent with neurogenic claudication.  New MRI shows significant stenosis at L4-5 quite marked at that level.  Moderate at L2-3 and L3-4.  The level of arthritic narrowing explains the difficulty in obtaining a good epidural injection at that level above the L3-4 level.  It still a puzzle as to why the [place] physicians pick the L2-3 level and still somewhat of a mystery is how those injections were doing quite well unless he just had recent changes at the L4-5 level.  Again we had a long discussion today about Kenalog versus Depo-Medrol and all the cortisone medicines in general.   At this point I think the right answer is a bilateral L4 transforaminal epidural steroid injection using Depo-Medrol.  I think the transforaminal approach will get the medicine more in that tight spot at L4.  Depending on relief would look at an L3 transforaminal injection.  Discussed these at length and the patient does want to proceed.    Meds & Orders: No orders of the defined types were placed in this encounter.  No orders of the defined types were placed in this encounter.   Follow-up: Return for Bilateral L4 transforaminal epidural steroid injection.   Procedures: No procedures performed  No notes on file   Clinical History: MRI LUMBAR SPINE WITHOUT CONTRAST  TECHNIQUE: Multiplanar, multisequence MR imaging of the lumbar spine was performed. No intravenous contrast was administered.  COMPARISON:  Plain films Mar 23, 2020  FINDINGS: Segmentation:  Standard.  Alignment: Levoconvex scoliosis of the lumbar spine. Grade 1 anterolisthesis of L2 over L3 and L4 over L5 with minimal retrolisthesis of L5 over S1.  Vertebrae:  No fracture, evidence of discitis, or bone lesion.  Conus medullaris and cauda equina: Conus extends to the L1-2 level. Conus and cauda equina appear normal.  Paraspinal and other soft tissues: Negative.  Disc levels:  T12-L1: Disc bulge with associated osteophytic component, facet degenerative changes and ligamentum flavum redundancy resulting in mild spinal canal stenosis and moderate to severe bilateral neural foraminal narrowing.  L1-2: Disc bulge, facet degenerative changes and ligamentum flavum redundancy resulting in mild spinal canal stenosis and moderate bilateral neural foraminal narrowing.  L2-3: Loss of disc height, disc bulge, facet degenerative changes and prominent redundancy of the ligamentum flavum resulting in moderate spinal canal stenosis and mild bilateral neural foraminal narrowing.  L3-4: Prominent loss of disc  height, disc bulge with associated osteophytic component, facet degenerative changes and ligamentum flavum redundancy resulting in moderate spinal canal stenosis, moderate right and mild left neural foraminal narrowing.  L4-5: Prominent loss disc height, endplate ridging, prominent hypertrophic facet degenerative changes and ligamentum flavum redundancy resulting in severe spinal canal stenosis and severe bilateral neural foraminal narrowing.  L5-S1: Disc bulge, facet degenerative changes and ligamentum flavum redundancy resulting in narrowing of the bilateral subarticular zones, left greater than right and moderate bilateral neural foraminal narrowing.  IMPRESSION: 1. Multilevel degenerative changes of the lumbar spine as described above, worst at L4-5 where there is severe spinal canal stenosis and severe bilateral neural foraminal narrowing. 2. Moderate spinal canal stenosis at L2-3 and L3-4. 3. Moderate bilateral neural foraminal narrowing at L5-S1 with narrowing of the bilateral subarticular zones at this level. 4. Moderate to severe bilateral neural foraminal narrowing at T12-L1.   Electronically Signed   By: Baldemar Lenis M.D.   On: 08/19/2020 11:59   He reports that he has never smoked. He  has never used smokeless tobacco. No results for input(s): HGBA1C, LABURIC in the last 8760 hours.  Objective:  VS:  HT:    WT:   BMI:     BP:104/64  HR:85bpm  TEMP: ( )  RESP:  Physical Exam Constitutional:      General: He is not in acute distress.    Appearance: Normal appearance. He is not ill-appearing.  HENT:     Head: Normocephalic and atraumatic.     Right Ear: External ear normal.     Left Ear: External ear normal.  Eyes:     Extraocular Movements: Extraocular movements intact.  Cardiovascular:     Rate and Rhythm: Normal rate.     Pulses: Normal pulses.  Abdominal:     General: There is no distension.     Palpations: Abdomen is soft.    Musculoskeletal:        General: No tenderness or signs of injury.     Right lower leg: No edema.     Left lower leg: No edema.     Comments: Patient has good distal strength without clonus.  Skin:    Findings: No erythema or rash.  Neurological:     General: No focal deficit present.     Mental Status: He is alert and oriented to person, place, and time.     Sensory: No sensory deficit.     Motor: No weakness or abnormal muscle tone.     Coordination: Coordination normal.  Psychiatric:        Mood and Affect: Mood normal.        Behavior: Behavior normal.     Ortho Exam  Imaging: No results found.  Past Medical/Family/Surgical/Social History: Medications & Allergies reviewed per EMR, new medications updated. Patient Active Problem List   Diagnosis Date Noted  . Central retinal artery occlusion of right eye 08/05/2020  . Optic atrophy, right eye 08/05/2020   Past Medical History:  Diagnosis Date  . Coronary artery disease 06/04/2009   stenting to the mid LAD with Xience 2.5 x 32 and overlapping 2.5 x 12 mm DES, [place], [place]  . Diabetes mellitus without complication (HCC)   . Hyperlipidemia   . Hypertension    Family History  Problem Relation Age of Onset  . Leukemia Mother   . Heart attack Brother   . Lung cancer Brother    Past Surgical History:  Procedure Laterality Date  . CARDIAC CATHETERIZATION  06/04/2009   Stent to LAD in [place], [place]  . REPLACEMENT TOTAL KNEE     bOTH  . ROTATOR CUFF REPAIR Bilateral    Social History   Occupational History  . Not on file  Tobacco Use  . Smoking status: Never Smoker  . Smokeless tobacco: Never Used  Vaping Use  . Vaping Use: Never used  Substance and Sexual Activity  . Alcohol use: Yes    Comment: Drinks Socially  . Drug use: Never  . Sexual activity: Not on file

## 2020-11-02 ENCOUNTER — Telehealth: Payer: Self-pay | Admitting: Physical Medicine and Rehabilitation

## 2020-11-02 NOTE — Telephone Encounter (Signed)
Pt called stating his back pain has returned and he would like to get scheduled for a another epidural.   [phone]

## 2020-11-03 NOTE — Telephone Encounter (Signed)
Ok to repeat bilateral L4 TF vs left L2-3 IL?

## 2020-11-04 ENCOUNTER — Telehealth: Payer: Self-pay

## 2020-11-04 NOTE — Telephone Encounter (Signed)
Left message #1

## 2020-11-04 NOTE — Telephone Encounter (Signed)
Scheduled for 1/25 at 1330 with driver.

## 2020-11-04 NOTE — Telephone Encounter (Signed)
See previous message

## 2020-11-04 NOTE — Telephone Encounter (Signed)
Patient called he is returning your phone call.

## 2020-11-04 NOTE — Telephone Encounter (Signed)
Definitely the L4 transforaminal and I would consider a different level I think one of the sides is very stenotic even from a foraminal standpoint.  But yes to L4 transforaminal injection.

## 2020-11-30 ENCOUNTER — Encounter: Payer: Self-pay | Admitting: Physical Medicine and Rehabilitation

## 2020-11-30 ENCOUNTER — Other Ambulatory Visit: Payer: Self-pay

## 2020-11-30 ENCOUNTER — Ambulatory Visit: Payer: Self-pay

## 2020-11-30 ENCOUNTER — Ambulatory Visit (INDEPENDENT_AMBULATORY_CARE_PROVIDER_SITE_OTHER): Payer: Medicare Other | Admitting: Physical Medicine and Rehabilitation

## 2020-11-30 VITALS — BP 121/72 | HR 73

## 2020-11-30 DIAGNOSIS — M961 Postlaminectomy syndrome, not elsewhere classified: Secondary | ICD-10-CM | POA: Diagnosis not present

## 2020-11-30 DIAGNOSIS — M48062 Spinal stenosis, lumbar region with neurogenic claudication: Secondary | ICD-10-CM

## 2020-11-30 DIAGNOSIS — M5416 Radiculopathy, lumbar region: Secondary | ICD-10-CM | POA: Diagnosis not present

## 2020-11-30 MED ORDER — METHYLPREDNISOLONE ACETATE 80 MG/ML IJ SUSP
80.0000 mg | Freq: Once | INTRAMUSCULAR | Status: AC
  Administered 2020-11-30: 80 mg

## 2020-11-30 NOTE — Progress Notes (Signed)
   Numeric Pain Rating Scale and Functional Assessment Average Pain 4   In the last MONTH (on 0-10 scale) has pain interfered with the following?  1. General activity like being  able to carry out your everyday physical activities such as walking, climbing stairs, carrying groceries, or moving a chair?  Rating(8)   +Driver, +BT, -Dye Allergies.  

## 2020-11-30 NOTE — Patient Instructions (Signed)

## 2021-01-06 NOTE — Procedures (Signed)
Lumbosacral Transforaminal Epidural Steroid Injection - Sub-Pedicular Approach with Fluoroscopic Guidance  Patient: Jaime [name]      Date of Birth: [birth year] MRN: [id number] PCP: Jarome Matin, MD      Visit Date: 11/30/2020   Universal Protocol:    Date/Time: 11/30/2020  Consent Given By: the patient  Position: PRONE  Additional Comments: Vital signs were monitored before and after the procedure. Patient was prepped and draped in the usual sterile fashion. The correct patient, procedure, and site was verified.   Injection Procedure Details:   Procedure diagnoses: Lumbar radiculopathy [M54.16]    Meds Administered:  Meds ordered this encounter  Medications  . methylPREDNISolone acetate (DEPO-MEDROL) injection 80 mg    Laterality: Right, Left  Location/Site:  L4-L5, L5-S1  Needle:5.0 in., 22 ga.  Short bevel or Quincke spinal needle  Needle Placement: Transforaminal  Findings:    -Comments: Excellent flow of contrast along the nerve, nerve root and into the epidural space.  Procedure Details: After squaring off the end-plates to get a true AP view, the C-arm was positioned so that an oblique view of the foramen as noted above was visualized. The target area is just inferior to the "nose of the scotty dog" or sub pedicular. The soft tissues overlying this structure were infiltrated with 2-3 ml. of 1% Lidocaine without Epinephrine.  The spinal needle was inserted toward the target using a "trajectory" view along the fluoroscope beam.  Under AP and lateral visualization, the needle was advanced so it did not puncture dura and was located close the 6 O'Clock position of the pedical in AP tracterory. Biplanar projections were used to confirm position. Aspiration was confirmed to be negative for CSF and/or blood. A 1-2 ml. volume of Isovue-250 was injected and flow of contrast was noted at each level. Radiographs were obtained for documentation purposes.   After  attaining the desired flow of contrast documented above, a 0.5 to 1.0 ml test dose of 0.25% Marcaine was injected into each respective transforaminal space.  The patient was observed for 90 seconds post injection.  After no sensory deficits were reported, and normal lower extremity motor function was noted,   the above injectate was administered so that equal amounts of the injectate were placed at each foramen (level) into the transforaminal epidural space.   Additional Comments:  The patient tolerated the procedure well Dressing: 2 x 2 sterile gauze and Band-Aid    Post-procedure details: Patient was observed during the procedure. Post-procedure instructions were reviewed.  Patient left the clinic in stable condition.

## 2021-01-06 NOTE — Progress Notes (Signed)
Jaime [name] - 87 y.o. male MRN [id number]  Date of birth: 03-06-1931  Office Visit Note: Visit Date: 11/30/2020 PCP: Jarome Matin, MD Referred by: Jarome Matin, MD  Subjective: Chief Complaint  Patient presents with  . Lower Back - Pain  . Left Leg - Pain  . Right Leg - Pain  . Right Knee - Pain  . Left Knee - Pain   HPI:  Jaime [name] is a 87 y.o. male who comes in today for planned Right L4-L5 and Left L5-S1 Transforaminal Lumbar epidural steroid injection with fluoroscopic guidance.  The patient has failed conservative care including home exercise, medications, time and activity modification.  This injection will be diagnostic and hopefully therapeutic.  Please see requesting physician notes for further details and justification. Very stenotic left L4 foramen.    ROS Otherwise per HPI.  Assessment & Plan: Visit Diagnoses:    ICD-10-CM   1. Lumbar radiculopathy  M54.16 XR C-ARM NO REPORT    Epidural Steroid injection    methylPREDNISolone acetate (DEPO-MEDROL) injection 80 mg  2. Spinal stenosis of lumbar region with neurogenic claudication  M48.062 XR C-ARM NO REPORT    Epidural Steroid injection    methylPREDNISolone acetate (DEPO-MEDROL) injection 80 mg  3. Post laminectomy syndrome  M96.1 XR C-ARM NO REPORT    Epidural Steroid injection    methylPREDNISolone acetate (DEPO-MEDROL) injection 80 mg    Plan: No additional findings.   Meds & Orders:  Meds ordered this encounter  Medications  . methylPREDNISolone acetate (DEPO-MEDROL) injection 80 mg    Orders Placed This Encounter  Procedures  . XR C-ARM NO REPORT  . Epidural Steroid injection    Follow-up: Return in about 3 months (around 02/28/2021).   Procedures: No procedures performed  Lumbosacral Transforaminal Epidural Steroid Injection - Sub-Pedicular Approach with Fluoroscopic Guidance  Patient: Jaime [name]      Date of Birth: 1931/05/13 MRN: [id number] PCP: Jarome Matin,  MD      Visit Date: 11/30/2020   Universal Protocol:    Date/Time: 11/30/2020  Consent Given By: the patient  Position: PRONE  Additional Comments: Vital signs were monitored before and after the procedure. Patient was prepped and draped in the usual sterile fashion. The correct patient, procedure, and site was verified.   Injection Procedure Details:   Procedure diagnoses: Lumbar radiculopathy [M54.16]    Meds Administered:  Meds ordered this encounter  Medications  . methylPREDNISolone acetate (DEPO-MEDROL) injection 80 mg    Laterality: Right, Left  Location/Site:  L4-L5, L5-S1  Needle:5.0 in., 22 ga.  Short bevel or Quincke spinal needle  Needle Placement: Transforaminal  Findings:    -Comments: Excellent flow of contrast along the nerve, nerve root and into the epidural space.  Procedure Details: After squaring off the end-plates to get a true AP view, the C-arm was positioned so that an oblique view of the foramen as noted above was visualized. The target area is just inferior to the "nose of the scotty dog" or sub pedicular. The soft tissues overlying this structure were infiltrated with 2-3 ml. of 1% Lidocaine without Epinephrine.  The spinal needle was inserted toward the target using a "trajectory" view along the fluoroscope beam.  Under AP and lateral visualization, the needle was advanced so it did not puncture dura and was located close the 6 O'Clock position of the pedical in AP tracterory. Biplanar projections were used to confirm position. Aspiration was confirmed to be negative for CSF and/or blood. A 1-2 ml. volume  of Isovue-250 was injected and flow of contrast was noted at each level. Radiographs were obtained for documentation purposes.   After attaining the desired flow of contrast documented above, a 0.5 to 1.0 ml test dose of 0.25% Marcaine was injected into each respective transforaminal space.  The patient was observed for 90 seconds post  injection.  After no sensory deficits were reported, and normal lower extremity motor function was noted,   the above injectate was administered so that equal amounts of the injectate were placed at each foramen (level) into the transforaminal epidural space.   Additional Comments:  The patient tolerated the procedure well Dressing: 2 x 2 sterile gauze and Band-Aid    Post-procedure details: Patient was observed during the procedure. Post-procedure instructions were reviewed.  Patient left the clinic in stable condition.      Clinical History: MRI LUMBAR SPINE WITHOUT CONTRAST  TECHNIQUE: Multiplanar, multisequence MR imaging of the lumbar spine was performed. No intravenous contrast was administered.  COMPARISON:  Plain films Mar 23, 2020  FINDINGS: Segmentation:  Standard.  Alignment: Levoconvex scoliosis of the lumbar spine. Grade 1 anterolisthesis of L2 over L3 and L4 over L5 with minimal retrolisthesis of L5 over S1.  Vertebrae:  No fracture, evidence of discitis, or bone lesion.  Conus medullaris and cauda equina: Conus extends to the L1-2 level. Conus and cauda equina appear normal.  Paraspinal and other soft tissues: Negative.  Disc levels:  T12-L1: Disc bulge with associated osteophytic component, facet degenerative changes and ligamentum flavum redundancy resulting in mild spinal canal stenosis and moderate to severe bilateral neural foraminal narrowing.  L1-2: Disc bulge, facet degenerative changes and ligamentum flavum redundancy resulting in mild spinal canal stenosis and moderate bilateral neural foraminal narrowing.  L2-3: Loss of disc height, disc bulge, facet degenerative changes and prominent redundancy of the ligamentum flavum resulting in moderate spinal canal stenosis and mild bilateral neural foraminal narrowing.  L3-4: Prominent loss of disc height, disc bulge with associated osteophytic component, facet degenerative changes  and ligamentum flavum redundancy resulting in moderate spinal canal stenosis, moderate right and mild left neural foraminal narrowing.  L4-5: Prominent loss disc height, endplate ridging, prominent hypertrophic facet degenerative changes and ligamentum flavum redundancy resulting in severe spinal canal stenosis and severe bilateral neural foraminal narrowing.  L5-S1: Disc bulge, facet degenerative changes and ligamentum flavum redundancy resulting in narrowing of the bilateral subarticular zones, left greater than right and moderate bilateral neural foraminal narrowing.  IMPRESSION: 1. Multilevel degenerative changes of the lumbar spine as described above, worst at L4-5 where there is severe spinal canal stenosis and severe bilateral neural foraminal narrowing. 2. Moderate spinal canal stenosis at L2-3 and L3-4. 3. Moderate bilateral neural foraminal narrowing at L5-S1 with narrowing of the bilateral subarticular zones at this level. 4. Moderate to severe bilateral neural foraminal narrowing at T12-L1.   Electronically Signed   By: Baldemar Lenis M.D.   On: 08/19/2020 11:59     Objective:  VS:  HT:    WT:   BMI:     BP:121/72  HR:73bpm  TEMP: ( )  RESP:  Physical Exam Vitals and nursing note reviewed.  Constitutional:      General: He is not in acute distress.    Appearance: Normal appearance. He is not ill-appearing.  HENT:     Head: Normocephalic and atraumatic.     Right Ear: External ear normal.     Left Ear: External ear normal.     Nose:  No congestion.  Eyes:     Extraocular Movements: Extraocular movements intact.  Cardiovascular:     Rate and Rhythm: Normal rate.     Pulses: Normal pulses.  Pulmonary:     Effort: Pulmonary effort is normal. No respiratory distress.  Abdominal:     General: There is no distension.     Palpations: Abdomen is soft.  Musculoskeletal:        General: No tenderness or signs of injury.     Cervical  back: Neck supple.     Right lower leg: No edema.     Left lower leg: No edema.     Comments: Patient has good distal strength without clonus.  Skin:    Findings: No erythema or rash.  Neurological:     General: No focal deficit present.     Mental Status: He is alert and oriented to person, place, and time.     Sensory: No sensory deficit.     Motor: No weakness or abnormal muscle tone.     Coordination: Coordination normal.  Psychiatric:        Mood and Affect: Mood normal.        Behavior: Behavior normal.      Imaging: No results found.

## 2021-03-01 ENCOUNTER — Ambulatory Visit: Payer: Self-pay

## 2021-03-01 ENCOUNTER — Other Ambulatory Visit: Payer: Self-pay

## 2021-03-01 ENCOUNTER — Encounter: Payer: Self-pay | Admitting: Physical Medicine and Rehabilitation

## 2021-03-01 ENCOUNTER — Ambulatory Visit (INDEPENDENT_AMBULATORY_CARE_PROVIDER_SITE_OTHER): Payer: Medicare Other | Admitting: Physical Medicine and Rehabilitation

## 2021-03-01 VITALS — BP 113/65 | HR 71

## 2021-03-01 DIAGNOSIS — M5416 Radiculopathy, lumbar region: Secondary | ICD-10-CM

## 2021-03-01 MED ORDER — METHYLPREDNISOLONE ACETATE 80 MG/ML IJ SUSP
80.0000 mg | Freq: Once | INTRAMUSCULAR | Status: AC
  Administered 2021-03-01: 80 mg

## 2021-03-01 NOTE — Patient Instructions (Signed)

## 2021-03-01 NOTE — Procedures (Signed)
Lumbosacral Transforaminal Epidural Steroid Injection - Sub-Pedicular Approach with Fluoroscopic Guidance  Patient: Jaime [name]      Date of Birth: 12/03/1930 MRN: [id number] PCP: Jarome Matin, MD      Visit Date: 03/01/2021   Universal Protocol:    Date/Time: 03/01/2021  Consent Given By: the patient  Position: PRONE  Additional Comments: Vital signs were monitored before and after the procedure. Patient was prepped and draped in the usual sterile fashion. The correct patient, procedure, and site was verified.   Injection Procedure Details:   Procedure diagnoses: Lumbar radiculopathy [M54.16]    Meds Administered:  Meds ordered this encounter  Medications  . methylPREDNISolone acetate (DEPO-MEDROL) injection 80 mg    Laterality: Right, Left  Location/Site:  L4-L5, L5-S1  Needle:5.0 in., 22 ga.  Short bevel or Quincke spinal needle  Needle Placement: Transforaminal  Findings:    -Comments: Excellent flow of contrast along the nerve, nerve root and into the epidural space.  Procedure Details: After squaring off the end-plates to get a true AP view, the C-arm was positioned so that an oblique view of the foramen as noted above was visualized. The target area is just inferior to the "nose of the scotty dog" or sub pedicular. The soft tissues overlying this structure were infiltrated with 2-3 ml. of 1% Lidocaine without Epinephrine.  The spinal needle was inserted toward the target using a "trajectory" view along the fluoroscope beam.  Under AP and lateral visualization, the needle was advanced so it did not puncture dura and was located close the 6 O'Clock position of the pedical in AP tracterory. Biplanar projections were used to confirm position. Aspiration was confirmed to be negative for CSF and/or blood. A 1-2 ml. volume of Isovue-250 was injected and flow of contrast was noted at each level. Radiographs were obtained for documentation purposes.   After  attaining the desired flow of contrast documented above, a 0.5 to 1.0 ml test dose of 0.25% Marcaine was injected into each respective transforaminal space.  The patient was observed for 90 seconds post injection.  After no sensory deficits were reported, and normal lower extremity motor function was noted,   the above injectate was administered so that equal amounts of the injectate were placed at each foramen (level) into the transforaminal epidural space.   Additional Comments:  The patient tolerated the procedure well Dressing: 2 x 2 sterile gauze and Band-Aid    Post-procedure details: Patient was observed during the procedure. Post-procedure instructions were reviewed.  Patient left the clinic in stable condition.

## 2021-03-01 NOTE — Progress Notes (Signed)
Pt state lower back pain the travels down to right knee and left calf pain. Pt state standing makes the pain worse. Pt state he take over the counter pain meds. Pt has hx of inj on 1/825/22 pt state it helped and lasted for two months.  Numeric Pain Rating Scale and Functional Assessment Average Pain 7   In the last MONTH (on 0-10 scale) has pain interfered with the following?  1. General activity like being  able to carry out your everyday physical activities such as walking, climbing stairs, carrying groceries, or moving a chair?  Rating(8)   +Driver, -BT, -Dye Allergies.

## 2021-03-01 NOTE — Progress Notes (Signed)
Jaime [name] - 87 y.o. male MRN [id number]  Date of birth: 1931-02-19  Office Visit Note: Visit Date: 03/01/2021 PCP: Jarome Matin, MD Referred by: Jarome Matin, MD  Subjective: Chief Complaint  Patient presents with  . Lower Back - Pain  . Right Knee - Pain  . Left Leg - Pain   HPI:  Jaime [name] is a 87 y.o. male who comes in today for planned repeat Right L4-L5 and Left L5-S1 Lumbar epidural steroid injection with fluoroscopic guidance.  The patient has failed conservative care including home exercise, medications, time and activity modification.  This injection will be diagnostic and hopefully therapeutic.  Please see requesting physician notes for further details and justification. Patient received more than 50% pain relief from prior injection.  Way of review patient has significantly degenerative spine with severe stenosis at L4-5 with pretty severe foraminal narrowing throughout as well.  In [place] he was receiving upper lumbar interlaminar injections with good relief somehow even though his problem was at L4-5 and this was confirmed with new imaging once he was seeing me.  Nonetheless the injections at the foramen are difficult to get full medication into that area.  In the future would try an L5-S1 interlaminar approach and see how well he does with that.  Referring: Dossie Arbour, MD   ROS Otherwise per HPI.  Assessment & Plan: Visit Diagnoses:    ICD-10-CM   1. Lumbar radiculopathy  M54.16 XR C-ARM NO REPORT    Epidural Steroid injection    methylPREDNISolone acetate (DEPO-MEDROL) injection 80 mg    Plan: No additional findings.   Meds & Orders:  Meds ordered this encounter  Medications  . methylPREDNISolone acetate (DEPO-MEDROL) injection 80 mg    Orders Placed This Encounter  Procedures  . XR C-ARM NO REPORT  . Epidural Steroid injection    Follow-up: Return if symptoms worsen or fail to improve.   Procedures: No procedures performed   Lumbosacral Transforaminal Epidural Steroid Injection - Sub-Pedicular Approach with Fluoroscopic Guidance  Patient: Jaime [name]      Date of Birth: 09/28/1931 MRN: [id number] PCP: Jarome Matin, MD      Visit Date: 03/01/2021   Universal Protocol:    Date/Time: 03/01/2021  Consent Given By: the patient  Position: PRONE  Additional Comments: Vital signs were monitored before and after the procedure. Patient was prepped and draped in the usual sterile fashion. The correct patient, procedure, and site was verified.   Injection Procedure Details:   Procedure diagnoses: Lumbar radiculopathy [M54.16]    Meds Administered:  Meds ordered this encounter  Medications  . methylPREDNISolone acetate (DEPO-MEDROL) injection 80 mg    Laterality: Right, Left  Location/Site:  L4-L5, L5-S1  Needle:5.0 in., 22 ga.  Short bevel or Quincke spinal needle  Needle Placement: Transforaminal  Findings:    -Comments: Excellent flow of contrast along the nerve, nerve root and into the epidural space.  Procedure Details: After squaring off the end-plates to get a true AP view, the C-arm was positioned so that an oblique view of the foramen as noted above was visualized. The target area is just inferior to the "nose of the scotty dog" or sub pedicular. The soft tissues overlying this structure were infiltrated with 2-3 ml. of 1% Lidocaine without Epinephrine.  The spinal needle was inserted toward the target using a "trajectory" view along the fluoroscope beam.  Under AP and lateral visualization, the needle was advanced so it did not puncture dura and was located close the  6 O'Clock position of the pedical in AP tracterory. Biplanar projections were used to confirm position. Aspiration was confirmed to be negative for CSF and/or blood. A 1-2 ml. volume of Isovue-250 was injected and flow of contrast was noted at each level. Radiographs were obtained for documentation purposes.   After  attaining the desired flow of contrast documented above, a 0.5 to 1.0 ml test dose of 0.25% Marcaine was injected into each respective transforaminal space.  The patient was observed for 90 seconds post injection.  After no sensory deficits were reported, and normal lower extremity motor function was noted,   the above injectate was administered so that equal amounts of the injectate were placed at each foramen (level) into the transforaminal epidural space.   Additional Comments:  The patient tolerated the procedure well Dressing: 2 x 2 sterile gauze and Band-Aid    Post-procedure details: Patient was observed during the procedure. Post-procedure instructions were reviewed.  Patient left the clinic in stable condition.      Clinical History: MRI LUMBAR SPINE WITHOUT CONTRAST  TECHNIQUE: Multiplanar, multisequence MR imaging of the lumbar spine was performed. No intravenous contrast was administered.  COMPARISON:  Plain films Mar 23, 2020  FINDINGS: Segmentation:  Standard.  Alignment: Levoconvex scoliosis of the lumbar spine. Grade 1 anterolisthesis of L2 over L3 and L4 over L5 with minimal retrolisthesis of L5 over S1.  Vertebrae:  No fracture, evidence of discitis, or bone lesion.  Conus medullaris and cauda equina: Conus extends to the L1-2 level. Conus and cauda equina appear normal.  Paraspinal and other soft tissues: Negative.  Disc levels:  T12-L1: Disc bulge with associated osteophytic component, facet degenerative changes and ligamentum flavum redundancy resulting in mild spinal canal stenosis and moderate to severe bilateral neural foraminal narrowing.  L1-2: Disc bulge, facet degenerative changes and ligamentum flavum redundancy resulting in mild spinal canal stenosis and moderate bilateral neural foraminal narrowing.  L2-3: Loss of disc height, disc bulge, facet degenerative changes and prominent redundancy of the ligamentum flavum resulting  in moderate spinal canal stenosis and mild bilateral neural foraminal narrowing.  L3-4: Prominent loss of disc height, disc bulge with associated osteophytic component, facet degenerative changes and ligamentum flavum redundancy resulting in moderate spinal canal stenosis, moderate right and mild left neural foraminal narrowing.  L4-5: Prominent loss disc height, endplate ridging, prominent hypertrophic facet degenerative changes and ligamentum flavum redundancy resulting in severe spinal canal stenosis and severe bilateral neural foraminal narrowing.  L5-S1: Disc bulge, facet degenerative changes and ligamentum flavum redundancy resulting in narrowing of the bilateral subarticular zones, left greater than right and moderate bilateral neural foraminal narrowing.  IMPRESSION: 1. Multilevel degenerative changes of the lumbar spine as described above, worst at L4-5 where there is severe spinal canal stenosis and severe bilateral neural foraminal narrowing. 2. Moderate spinal canal stenosis at L2-3 and L3-4. 3. Moderate bilateral neural foraminal narrowing at L5-S1 with narrowing of the bilateral subarticular zones at this level. 4. Moderate to severe bilateral neural foraminal narrowing at T12-L1.   Electronically Signed   By: Baldemar Lenis M.D.   On: 08/19/2020 11:59     Objective:  VS:  HT:    WT:   BMI:     BP:113/65  HR:71bpm  TEMP: ( )  RESP:  Physical Exam Vitals and nursing note reviewed.  Constitutional:      General: He is not in acute distress.    Appearance: Normal appearance. He is not ill-appearing.  HENT:  Head: Normocephalic and atraumatic.     Right Ear: External ear normal.     Left Ear: External ear normal.     Nose: No congestion.  Eyes:     Extraocular Movements: Extraocular movements intact.  Cardiovascular:     Rate and Rhythm: Normal rate.     Pulses: Normal pulses.  Pulmonary:     Effort: Pulmonary effort is  normal. No respiratory distress.  Abdominal:     General: There is no distension.     Palpations: Abdomen is soft.  Musculoskeletal:        General: No tenderness or signs of injury.     Cervical back: Neck supple.     Right lower leg: No edema.     Left lower leg: No edema.     Comments: Patient has good distal strength without clonus.  Skin:    Findings: No erythema or rash.  Neurological:     General: No focal deficit present.     Mental Status: He is alert and oriented to person, place, and time.     Sensory: No sensory deficit.     Motor: No weakness or abnormal muscle tone.     Coordination: Coordination normal.  Psychiatric:        Mood and Affect: Mood normal.        Behavior: Behavior normal.      Imaging: No results found.

## 2021-03-10 ENCOUNTER — Other Ambulatory Visit: Payer: Self-pay

## 2021-03-10 ENCOUNTER — Ambulatory Visit: Payer: Medicare Other | Admitting: Cardiology

## 2021-03-10 ENCOUNTER — Encounter: Payer: Self-pay | Admitting: Cardiology

## 2021-03-10 VITALS — BP 112/67 | HR 85 | Temp 98.2°F | Resp 17 | Ht 66.5 in | Wt 158.4 lb

## 2021-03-10 DIAGNOSIS — I739 Peripheral vascular disease, unspecified: Secondary | ICD-10-CM

## 2021-03-10 DIAGNOSIS — I2510 Atherosclerotic heart disease of native coronary artery without angina pectoris: Secondary | ICD-10-CM

## 2021-03-10 DIAGNOSIS — E7800 Pure hypercholesterolemia, unspecified: Secondary | ICD-10-CM

## 2021-03-10 NOTE — Progress Notes (Signed)
Primary Physician/Referring:  Leanna Battles, MD  Patient ID: Jaime [name], male    DOB: 1931-05-13, 87 y.o.   MRN: [id number]  Chief Complaint  Patient presents with  . Coronary Artery Disease  . PAD    6 MONTHS   HPI:    Jaime [name]  is a 87 y.o. 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  Since then he has not had any recurrence of angina pectoris.  Moved from [place] to be closer to his daughter in [place] since March 2021.Marland Kitchen  He also has history of PAD from diabetes mellitus.  Remains active.  His wife has MS and he is the active caregiver.  He has occasional night cramps and non limiting. No dyspnea or PND or orthopnea. No dizziness or syncope.  Past Medical History:  Diagnosis Date  . Coronary artery disease 06/04/2009   stenting to the mid LAD with Xience 2.5 x 32 and overlapping 2.5 x 12 mm DES, [place], [place]  . Diabetes mellitus without complication ([place])   . Hyperlipidemia   . Hypertension    Past Surgical History:  Procedure Laterality Date  . CARDIAC CATHETERIZATION  06/04/2009   Stent to LAD in [place], [place]  . REPLACEMENT TOTAL KNEE     bOTH  . ROTATOR CUFF REPAIR Bilateral    Family History  Problem Relation Age of Onset  . Leukemia Mother   . Heart attack Brother   . Lung cancer Brother     Social History   Tobacco Use  . Smoking status: Former Smoker    Packs/day: 1.00    Years: 10.00    Pack years: 10.00    Types: Cigarettes    Quit date: 03/10/1949    Years since quitting: 72.0  . Smokeless tobacco: Never Used  Substance Use Topics  . Alcohol use: Yes    Comment: Drinks Socially   Marital Status:    ROS  Review of Systems  Constitutional: Negative for weight gain.   Cardiovascular: Negative for dyspnea on exertion, leg swelling and syncope.  Respiratory: Negative for shortness of breath.   Musculoskeletal: Positive for back pain and muscle cramps (at night). Negative for joint swelling.   Objective  Blood pressure 112/67, pulse 85, temperature 98.2 F (36.8 C), temperature source Temporal, resp. rate 17, height 5' 6.5" (1.689 m), weight 158 lb 6.4 oz (71.8 kg), SpO2 97 %.  Vitals with BMI 03/10/2021 03/01/2021 11/30/2020  Height 5' 6.5" - -  Weight 158 lbs 6 oz - -  BMI 32.54 - -  Systolic 982 641 583  Diastolic 67 65 72  Pulse 85 71 73     Physical Exam Constitutional:      General: He is not in acute distress.    Appearance: He is well-developed.  Cardiovascular:     Rate and Rhythm: Normal rate and regular rhythm.     Pulses:          Carotid pulses are 2+ on the right side and 2+ on the left side.      Popliteal pulses are 2+ on the right side and 2+ on the left side.       Dorsalis pedis pulses are 1+ on the right side and 1+ on the left side.  Posterior tibial pulses are 1+ on the right side and 1+ on the left side.     Heart sounds: No murmur heard. No gallop.      Comments: S1 is normal, S2 is widely split with physiologic variation.   No leg edema. No JVD.   Pulmonary:     Effort: Pulmonary effort is normal. No accessory muscle usage or respiratory distress.     Breath sounds: Normal breath sounds.  Abdominal:     Palpations: Abdomen is soft.    Laboratory examination:   External labs:   Labs 07/15/2020:  Total cholesterol 117, triglycerides 73, HDL 38, LDL 64.  Serum glucose 83 mg, BUN 21, creatinine 0.89, EGFR 77 mL, potassium 4.1, CMP normal.  Serum bilirubin was 1.6.  A1c 6.4%.  TSH normal at 3.44.  T4 normal.  Hb 12.9, HCT 40.1, platelets 186, normal indicis.  Urine analysis 3+ bilirubin but otherwise normal.  Medications and allergies  No Known Allergies   Current Outpatient Medications on File Prior to  Visit  Medication Sig Dispense Refill  . aspirin EC 81 MG tablet Take 81 mg by mouth daily.    Marland Kitchen gabapentin (NEURONTIN) 300 MG capsule Take 300 mg by mouth daily.    Marland Kitchen ibuprofen (ADVIL) 400 MG tablet Take 400 mg by mouth every 6 (six) hours as needed.    Marland Kitchen JARDIANCE 25 MG TABS tablet Take 25 mg by mouth daily.    Marland Kitchen levothyroxine (SYNTHROID) 175 MCG tablet Take 175 mcg by mouth every other day.    . Metoprolol Succinate 25 MG CS24 Take 25 mg by mouth.    . mirabegron ER (MYRBETRIQ) 50 MG TB24 tablet Take 50 mg by mouth daily.    . nitroGLYCERIN (NITROSTAT) 0.4 MG SL tablet Place 1 tablet (0.4 mg total) under the tongue every 5 (five) minutes as needed for up to 25 days for chest pain. 25 tablet 3  . rosuvastatin (CRESTOR) 20 MG tablet Take 1 tablet by mouth at bedtime.    . tamsulosin (FLOMAX) 0.4 MG CAPS capsule Take 1 capsule by mouth daily.    Tyler Aas FLEXTOUCH 100 UNIT/ML FlexTouch Pen Inject 17 Units into the skin daily.    . Vitamin D, Cholecalciferol, 50 MCG (2000 UT) CAPS Take 1 capsule by mouth daily.    . vitamin E 180 MG (400 UNITS) capsule Take 400 Units by mouth daily.    Marland Kitchen zolpidem (AMBIEN) 10 MG tablet Take 10 mg by mouth at bedtime as needed for sleep.     No current facility-administered medications on file prior to visit.    Radiology:   No results found.  Cardiac Studies:  Echocardiogram 04/20/2020: Left ventricle cavity is normal in size. Moderate concentric hypertrophy of the left ventricle. Normal global wall motion. Normal LV systolic function with EF 59%. Doppler evidence of grade I (impaired) diastolic dysfunction, normal LAP. Calculated EF 59%. Left atrial cavity is severely dilated at 51 cc/m2. No aortic valve regurgitation. Mild aortic valve leaflet calcification. Mildly restricted aortic valve leaflets. Mild mitral valve leaflet calcification. Mildly restricted mitral valve leaflets. Mild (Grade I) mitral regurgitation. Mild tricuspid regurgitation. Estimated  pulmonary artery systolic pressure is 25 mmHg.   EKG  EKG 03/10/2021: Normal sinus rhythm at rate of 79 bpm, left axis deviation, left anterior fascicular block.  Cannot exclude inferior infarct old.  Right bundle branch block.  Bifascicular block.  Low-voltage complexes, pulmonary disease pattern.   No significant change from EKG 09/10/2020   Assessment  ICD-10-CM   1. Coronary artery disease involving native coronary artery of native heart without angina pectoris  I25.10 EKG 12-Lead  2. Pure hypercholesterolemia  E78.00   3. Peripheral vascular disease (HCC)  I73.9     No orders of the defined types were placed in this encounter.   Medications Discontinued During This Encounter  Medication Reason  . 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  Since then he has not had any recurrence of angina pectoris.  Recently moved from [place] to be closer to his daughter in [place].  This is a 55-monthoffice visit and follow-up, I wanted to obtain his labs.  I reviewed his external labs performed by his PCP, lipids under excellent control, renal function is normal and he does have thalassemia minor and mild chronic anemia but stable overall.  Chronically elevated serum bilirubin is stable for him.  He remains asymptomatic, no clinical evidence of heart failure and no recurrence of angina pectoris.  I will see him back in 6 months for follow-up.   JAdrian Prows MD, [facility]5/03/2021, 9:26 AM Office: [phone]: [phone]

## 2021-05-31 ENCOUNTER — Ambulatory Visit (INDEPENDENT_AMBULATORY_CARE_PROVIDER_SITE_OTHER): Payer: Medicare Other | Admitting: Physical Medicine and Rehabilitation

## 2021-05-31 ENCOUNTER — Other Ambulatory Visit: Payer: Self-pay

## 2021-05-31 ENCOUNTER — Encounter: Payer: Self-pay | Admitting: Physical Medicine and Rehabilitation

## 2021-05-31 ENCOUNTER — Ambulatory Visit: Payer: Self-pay

## 2021-05-31 VITALS — BP 130/79 | HR 72

## 2021-05-31 DIAGNOSIS — M48062 Spinal stenosis, lumbar region with neurogenic claudication: Secondary | ICD-10-CM | POA: Diagnosis not present

## 2021-05-31 DIAGNOSIS — M5416 Radiculopathy, lumbar region: Secondary | ICD-10-CM | POA: Diagnosis not present

## 2021-05-31 MED ORDER — METHYLPREDNISOLONE ACETATE 80 MG/ML IJ SUSP
80.0000 mg | Freq: Once | INTRAMUSCULAR | Status: AC
  Administered 2021-05-31: 80 mg

## 2021-05-31 NOTE — Patient Instructions (Signed)

## 2021-05-31 NOTE — Progress Notes (Signed)
Pt state lower back pain that travels to both knee and left calf. Pt state standing makes the pain worse and getting out of bed in the morning. Pt state he takes over the counter pain meds and sit to rest to help ease his pain. Pt has hx of inj on 03/01/21 pt state it helped for a month and a half.  Numeric Pain Rating Scale and Functional Assessment Average Pain 8   In the last MONTH (on 0-10 scale) has pain interfered with the following?  1. General activity like being  able to carry out your everyday physical activities such as walking, climbing stairs, carrying groceries, or moving a chair?  Rating(10)   +Driver, -BT, -Dye Allergies.

## 2021-06-02 NOTE — Procedures (Signed)
Lumbar Epidural Steroid Injection - Interlaminar Approach with Fluoroscopic Guidance  Patient: Jaime [name]      Date of Birth: Jan 04, 1931 MRN: [id number] PCP: Jarome Matin, MD      Visit Date: 05/31/2021   Universal Protocol:     Consent Given By: the patient  Position: PRONE  Additional Comments: Vital signs were monitored before and after the procedure. Patient was prepped and draped in the usual sterile fashion. The correct patient, procedure, and site was verified.   Injection Procedure Details:   Procedure diagnoses: Lumbar radiculopathy [M54.16]   Meds Administered:  Meds ordered this encounter  Medications   methylPREDNISolone acetate (DEPO-MEDROL) injection 80 mg     Laterality: Right  Location/Site:  L5-S1  Needle: 22-gauge short bevel spinal needle  Needle Placement: Paramedian epidural  Findings:   -Comments: Excellent flow of contrast into the epidural space.  Procedure Details: Using a paramedian approach from the side mentioned above, the region overlying the inferior lamina was localized under fluoroscopic visualization and the soft tissues overlying this structure were infiltrated with 4 ml. of 1% Lidocaine without Epinephrine.  Attempt was initially made at the L4-5 level which with fluoroscopic imaging there is an opening but as soon as you get down to the interlaminar line it just seems to be no access to the epidural space from a bony standpoint or possibly because of the angles with his spine and degeneration.  Multiple attempts were made cranially and caudally and just could not obtain any loss of resistance although oblique view showed that we were right at the level.  At that point we did adjust to try L3-4 and again, found the same issue with not able to gain access to the epidural space.  Ultimately we did reanesthetized over the L5-S1 level and the spinal needle was inserted into the epidural space using a paramedian approach.   The  epidural space was localized using loss of resistance along with counter oblique bi-planar fluoroscopic views.  After negative aspirate for air, blood, and CSF, a 2 ml. volume of Isovue-250 was injected into the epidural space and the flow of contrast was observed. Radiographs were obtained for documentation purposes.    The injectate was administered into the level noted above.   Additional Comments:  The patient tolerated the procedure well Dressing: 2 x 2 sterile gauze and Band-Aid    Post-procedure details: Patient was observed during the procedure. Post-procedure instructions were reviewed.  Patient left the clinic in stable condition.

## 2021-06-02 NOTE — Progress Notes (Signed)
Celso Granja - 87 y.o. male MRN [id number]  Date of birth: 04-16-1931  Office Visit Note: Visit Date: 05/31/2021 PCP: Jarome Matin, MD Referred by: Jarome Matin, MD  Subjective: Chief Complaint  Patient presents with   Lower Back - Pain   Right Knee - Pain   Left Knee - Pain   Left Leg - Pain   HPI:  Macklen Wilhoite is a 88 y.o. male who comes in today For planned epidural injection for severe stenosis and lumbar radiculitis radiculopathy.  Last injection was L4 transforaminal injection on the right at [facility] the left.  He had significant amount of pain during the procedure because of the foraminal stenosis at L4.  Briefly his case is such that while living in [place] he had routinely L2-3 interlaminar injection every 3 months.  When he moved here and we saw him it was a little bit difficult because we just really do not do every 52-month injections unless they are really helping and in particular if they were lasting longer than 3 months we just not simply repeated at that point.  We discussed that at length with him at the time and we proceeded with treatment.  I did complete L2-3 injections initially with some relief but just not like he seemed to have in [place].  I did review the notes from [place] and the physician there and they indeed were doing interlaminar injections at that level.  We ended up getting a new MRI of his lumbar spine because it had not been repeated in quite a while which again was an interesting fact since to have these injections every 3 months.  Nonetheless the MRI is reviewed again below and it shows severe arthritis some degenerative scoliosis as well as severe stenosis particular at L4-5 and in the foramen bilaterally.  His complaints today are again low back pain and bilateral hip and leg pain.  Worse with standing and ambulating consistent with lumbar stenosis.  At his age of 77 he is probably not a candidate and does not really wish to have a big surgery  at this point.  I doubt that a simple decompression would do it.  He has had no focal weakness no bowel or bladder difficulties.  Transforaminal injections have helped the last injection did help for about a month and a half.  I think the best approach today is to try an interlaminar injection at L4-5 where he has the tight stenosis.  I did review the MRI and I feel like we could probably do an interlaminar injection at this level based on the MRI.  As you will note in the procedure note however from a bony standpoint and anatomy is just very hard to get loss of resistance and epidural accessed from really any of the interlaminar positions.  It seems as if we cannot get that open enough on the table with trying to bolster his hips up.  We did ultimately complete an L5-S1 interlaminar injection as reviewed with the procedure note.  The patient also had questions about his wife who we have seen on 1 occasion for shoulder injection.  She has seen Dr. August Saucer and I did review the notes briefly.  They are looking at doing viscosupplementation for her shoulder and I am happy to do the injection for her.  I did try to answer some questions concerning what they were trying to do and use for medication.  I encouraged him to ask Dr. August Saucer and his staff if  there is any other questions.  ROS Otherwise per HPI.  Assessment & Plan: Visit Diagnoses:    ICD-10-CM   1. Lumbar radiculopathy  M54.16 XR C-ARM NO REPORT    Epidural Steroid injection    methylPREDNISolone acetate (DEPO-MEDROL) injection 80 mg    2. Spinal stenosis of lumbar region with neurogenic claudication  M48.062 XR C-ARM NO REPORT    Epidural Steroid injection    methylPREDNISolone acetate (DEPO-MEDROL) injection 80 mg      Plan: No additional findings.   Meds & Orders:  Meds ordered this encounter  Medications   methylPREDNISolone acetate (DEPO-MEDROL) injection 80 mg    Orders Placed This Encounter  Procedures   XR C-ARM NO REPORT    Epidural Steroid injection    Follow-up: Return if symptoms worsen or fail to improve.   Procedures: No procedures performed  Lumbar Epidural Steroid Injection - Interlaminar Approach with Fluoroscopic Guidance  Patient: Zymere Patlan      Date of Birth: 1931/10/12 MRN: [id number] PCP: Jarome Matin, MD      Visit Date: 05/31/2021   Universal Protocol:     Consent Given By: the patient  Position: PRONE  Additional Comments: Vital signs were monitored before and after the procedure. Patient was prepped and draped in the usual sterile fashion. The correct patient, procedure, and site was verified.   Injection Procedure Details:   Procedure diagnoses: Lumbar radiculopathy [M54.16]   Meds Administered:  Meds ordered this encounter  Medications   methylPREDNISolone acetate (DEPO-MEDROL) injection 80 mg     Laterality: Right  Location/Site:  L5-S1  Needle: 22-gauge short bevel spinal needle  Needle Placement: Paramedian epidural  Findings:   -Comments: Excellent flow of contrast into the epidural space.  Procedure Details: Using a paramedian approach from the side mentioned above, the region overlying the inferior lamina was localized under fluoroscopic visualization and the soft tissues overlying this structure were infiltrated with 4 ml. of 1% Lidocaine without Epinephrine.  Attempt was initially made at the L4-5 level which with fluoroscopic imaging there is an opening but as soon as you get down to the interlaminar line it just seems to be no access to the epidural space from a bony standpoint or possibly because of the angles with his spine and degeneration.  Multiple attempts were made cranially and caudally and just could not obtain any loss of resistance although oblique view showed that we were right at the level.  At that point we did adjust to try L3-4 and again, found the same issue with not able to gain access to the epidural space.  Ultimately we did  reanesthetized over the L5-S1 level and the spinal needle was inserted into the epidural space using a paramedian approach.   The epidural space was localized using loss of resistance along with counter oblique bi-planar fluoroscopic views.  After negative aspirate for air, blood, and CSF, a 2 ml. volume of Isovue-250 was injected into the epidural space and the flow of contrast was observed. Radiographs were obtained for documentation purposes.    The injectate was administered into the level noted above.   Additional Comments:  The patient tolerated the procedure well Dressing: 2 x 2 sterile gauze and Band-Aid    Post-procedure details: Patient was observed during the procedure. Post-procedure instructions were reviewed.  Patient left the clinic in stable condition.    Clinical History: MRI LUMBAR SPINE WITHOUT CONTRAST   TECHNIQUE: Multiplanar, multisequence MR imaging of the lumbar spine was performed. No intravenous  contrast was administered.   COMPARISON:  Plain films Mar 23, 2020   FINDINGS: Segmentation:  Standard.   Alignment: Levoconvex scoliosis of the lumbar spine. Grade 1 anterolisthesis of L2 over L3 and L4 over L5 with minimal retrolisthesis of L5 over S1.   Vertebrae:  No fracture, evidence of discitis, or bone lesion.   Conus medullaris and cauda equina: Conus extends to the L1-2 level. Conus and cauda equina appear normal.   Paraspinal and other soft tissues: Negative.   Disc levels:   T12-L1: Disc bulge with associated osteophytic component, facet degenerative changes and ligamentum flavum redundancy resulting in mild spinal canal stenosis and moderate to severe bilateral neural foraminal narrowing.   L1-2: Disc bulge, facet degenerative changes and ligamentum flavum redundancy resulting in mild spinal canal stenosis and moderate bilateral neural foraminal narrowing.   L2-3: Loss of disc height, disc bulge, facet degenerative changes and  prominent redundancy of the ligamentum flavum resulting in moderate spinal canal stenosis and mild bilateral neural foraminal narrowing.   L3-4: Prominent loss of disc height, disc bulge with associated osteophytic component, facet degenerative changes and ligamentum flavum redundancy resulting in moderate spinal canal stenosis, moderate right and mild left neural foraminal narrowing.   L4-5: Prominent loss disc height, endplate ridging, prominent hypertrophic facet degenerative changes and ligamentum flavum redundancy resulting in severe spinal canal stenosis and severe bilateral neural foraminal narrowing.   L5-S1: Disc bulge, facet degenerative changes and ligamentum flavum redundancy resulting in narrowing of the bilateral subarticular zones, left greater than right and moderate bilateral neural foraminal narrowing.   IMPRESSION: 1. Multilevel degenerative changes of the lumbar spine as described above, worst at L4-5 where there is severe spinal canal stenosis and severe bilateral neural foraminal narrowing. 2. Moderate spinal canal stenosis at L2-3 and L3-4. 3. Moderate bilateral neural foraminal narrowing at L5-S1 with narrowing of the bilateral subarticular zones at this level. 4. Moderate to severe bilateral neural foraminal narrowing at T12-L1.     Electronically Signed   By: Baldemar Lenis M.D.   On: 08/19/2020 11:59     Objective:  VS:  HT:    WT:   BMI:     BP:130/79  HR:72bpm  TEMP: ( )  RESP:  Physical Exam Vitals and nursing note reviewed.  Constitutional:      General: He is not in acute distress.    Appearance: Normal appearance. He is not ill-appearing.  HENT:     Head: Normocephalic and atraumatic.     Right Ear: External ear normal.     Left Ear: External ear normal.     Nose: No congestion.  Eyes:     Extraocular Movements: Extraocular movements intact.  Cardiovascular:     Rate and Rhythm: Normal rate.     Pulses: Normal  pulses.  Pulmonary:     Effort: Pulmonary effort is normal. No respiratory distress.  Abdominal:     General: There is no distension.     Palpations: Abdomen is soft.  Musculoskeletal:        General: No tenderness or signs of injury.     Cervical back: Neck supple.     Right lower leg: No edema.     Left lower leg: No edema.     Comments: Patient has good distal strength without clonus.  Skin:    Findings: No erythema or rash.  Neurological:     General: No focal deficit present.     Mental Status: He is alert and  oriented to person, place, and time.     Sensory: No sensory deficit.     Motor: No weakness or abnormal muscle tone.     Coordination: Coordination normal.  Psychiatric:        Mood and Affect: Mood normal.        Behavior: Behavior normal.     Imaging: No results found.

## 2021-06-16 ENCOUNTER — Telehealth: Payer: Self-pay | Admitting: Physical Medicine and Rehabilitation

## 2021-06-16 NOTE — Telephone Encounter (Signed)
Pt called requesting a call back to reschedule appt. Please call pt at [phone].

## 2021-06-21 ENCOUNTER — Ambulatory Visit: Payer: Medicare Other | Admitting: Physical Medicine and Rehabilitation

## 2021-06-28 ENCOUNTER — Ambulatory Visit: Payer: Medicare Other | Admitting: Physical Medicine and Rehabilitation

## 2021-06-30 ENCOUNTER — Encounter: Payer: Self-pay | Admitting: Physical Medicine and Rehabilitation

## 2021-06-30 ENCOUNTER — Other Ambulatory Visit: Payer: Self-pay

## 2021-06-30 ENCOUNTER — Ambulatory Visit (INDEPENDENT_AMBULATORY_CARE_PROVIDER_SITE_OTHER): Payer: Medicare Other | Admitting: Physical Medicine and Rehabilitation

## 2021-06-30 DIAGNOSIS — I2510 Atherosclerotic heart disease of native coronary artery without angina pectoris: Secondary | ICD-10-CM

## 2021-06-30 DIAGNOSIS — M47816 Spondylosis without myelopathy or radiculopathy, lumbar region: Secondary | ICD-10-CM

## 2021-06-30 DIAGNOSIS — M48062 Spinal stenosis, lumbar region with neurogenic claudication: Secondary | ICD-10-CM | POA: Diagnosis not present

## 2021-06-30 DIAGNOSIS — M961 Postlaminectomy syndrome, not elsewhere classified: Secondary | ICD-10-CM | POA: Diagnosis not present

## 2021-06-30 DIAGNOSIS — M5416 Radiculopathy, lumbar region: Secondary | ICD-10-CM

## 2021-06-30 NOTE — Progress Notes (Signed)
Jaime [name] - 87 y.o. male MRN [id number]  Date of birth: 08-27-1931  Office Visit Note: Visit Date: 06/30/2021 PCP: Jarome Matin, MD Referred by: Jarome Matin, MD  Subjective: Chief Complaint  Patient presents with   Lower Back - Pain   HPI: Jaime [name] is a 87 y.o. male who comes in today for evaluation of chronic, worsening and severe bilateral lower back pain radiating to hips and down to lateral thighs and knees. Patient reports chronic pain for several years. Patient reports pain is exacerbated by walking and prolonged standing. Patient describes pain as shooting and sore, currently rates 9 out of 10. Patient reports some pain relief with Gabapentin, Ibuprofen and rest. Patient's lumbar MRI from 2021 exhibits multi-level degenerative changes, severe spinal stenosis and bilateral foraminal narrowing at L4-L5 and moderate spinal stenosis at L2-L4 and L3-L4. Bilateral foraminal narrowing also noted at L5-S1. Patient has a history of laminectomy at L4-L5 and L5-S1, he had this procedure performed in [place] several years ago. Patient reports good success with lumbar epidural in the past, states sustained pain relief that lasted roughly 3-4 months. Patient had right L5-S1 interlaminar epidural steroid injection in July, which he reports gave him significant relief, but was short lived lasting only 1 week. Patient denies focal weakness, numbness and tingling. Patient denies recent trauma or falls.     Review of Systems  Musculoskeletal:  Positive for back pain.  Neurological:  Negative for tingling, sensory change, focal weakness and weakness.  All other systems reviewed and are negative. Otherwise per HPI.  Assessment & Plan: Visit Diagnoses:    ICD-10-CM   1. Lumbar radiculopathy  M54.16     2. Spinal stenosis of lumbar region with neurogenic claudication  M48.062     3. Spondylosis without myelopathy or radiculopathy, lumbar region  M47.816     4. Facet  arthropathy, lumbar  M47.816     5. Post laminectomy syndrome  M96.1        Plan: Findings:  Chronic, worsening and severe bilateral lower back pain radiating to hips and down to lateral thighs and knees. Patient's clinical presentation and exam are consistent with L3/L4 nerve distribution. Patient continues to have severe pain despite good conservative therapies such as use of medications and rest. We spoke with patient in detail about MRI from 2021 using spine model. We believe the next step is to perform a diagnostic and hopefully therapeutic bilateral L3-L4 transforaminal epidural steroid injection.  Trying this level from a transforaminal approach just to get some medication hopefully at that level of stenosis.  It is very hard to inject the L4 level do to the stenosis of both the canals and foramen.  Patient encouraged to continue conservative therapies at home. We also discussed increasing the dose of his Gabapentin in the future, we will re-evaluate for medication changes after injection is completed. We also briefly discussed spinal cord stimulator trial/placement with patient today and answered questions he had about this procedure. Surgical options also discussed with patient and he states he would like to avoid any further spine surgery if possible. We feel confident that we can get back in quickly for his injection within the next week. No red flag symptoms noted upon exam today.    Meds & Orders: No orders of the defined types were placed in this encounter.  No orders of the defined types were placed in this encounter.   Follow-up: Return in about 1 week (around 07/07/2021) for Bilateral L3-L4 transforaminal epidural steroid  injection.   Procedures: No procedures performed      Clinical History: MRI LUMBAR SPINE WITHOUT CONTRAST   TECHNIQUE: Multiplanar, multisequence MR imaging of the lumbar spine was performed. No intravenous contrast was administered.   COMPARISON:  Plain films  Mar 23, 2020   FINDINGS: Segmentation:  Standard.   Alignment: Levoconvex scoliosis of the lumbar spine. Grade 1 anterolisthesis of L2 over L3 and L4 over L5 with minimal retrolisthesis of L5 over S1.   Vertebrae:  No fracture, evidence of discitis, or bone lesion.   Conus medullaris and cauda equina: Conus extends to the L1-2 level. Conus and cauda equina appear normal.   Paraspinal and other soft tissues: Negative.   Disc levels:   T12-L1: Disc bulge with associated osteophytic component, facet degenerative changes and ligamentum flavum redundancy resulting in mild spinal canal stenosis and moderate to severe bilateral neural foraminal narrowing.   L1-2: Disc bulge, facet degenerative changes and ligamentum flavum redundancy resulting in mild spinal canal stenosis and moderate bilateral neural foraminal narrowing.   L2-3: Loss of disc height, disc bulge, facet degenerative changes and prominent redundancy of the ligamentum flavum resulting in moderate spinal canal stenosis and mild bilateral neural foraminal narrowing.   L3-4: Prominent loss of disc height, disc bulge with associated osteophytic component, facet degenerative changes and ligamentum flavum redundancy resulting in moderate spinal canal stenosis, moderate right and mild left neural foraminal narrowing.   L4-5: Prominent loss disc height, endplate ridging, prominent hypertrophic facet degenerative changes and ligamentum flavum redundancy resulting in severe spinal canal stenosis and severe bilateral neural foraminal narrowing.   L5-S1: Disc bulge, facet degenerative changes and ligamentum flavum redundancy resulting in narrowing of the bilateral subarticular zones, left greater than right and moderate bilateral neural foraminal narrowing.   IMPRESSION: 1. Multilevel degenerative changes of the lumbar spine as described above, worst at L4-5 where there is severe spinal canal stenosis and severe bilateral  neural foraminal narrowing. 2. Moderate spinal canal stenosis at L2-3 and L3-4. 3. Moderate bilateral neural foraminal narrowing at L5-S1 with narrowing of the bilateral subarticular zones at this level. 4. Moderate to severe bilateral neural foraminal narrowing at T12-L1.     Electronically Signed   By: Baldemar Lenis M.D.   On: 08/19/2020 11:59   He reports that he quit smoking about 72 years ago. His smoking use included cigarettes. He has a 10.00 pack-year smoking history. He has never used smokeless tobacco. No results for input(s): HGBA1C, LABURIC in the last 8760 hours.  Objective:  VS:  HT:    WT:   BMI:     BP:   HR: bpm  TEMP: ( )  RESP:  Physical Exam HENT:     Head: Normocephalic and atraumatic.     Right Ear: Tympanic membrane normal.     Left Ear: Tympanic membrane normal.     Nose: Nose normal.     Mouth/Throat:     Mouth: Mucous membranes are moist.  Eyes:     Pupils: Pupils are equal, round, and reactive to light.  Cardiovascular:     Rate and Rhythm: Normal rate.     Pulses: Normal pulses.  Pulmonary:     Effort: Pulmonary effort is normal.  Abdominal:     General: Abdomen is flat. There is no distension.  Musculoskeletal:     Cervical back: Normal range of motion and neck supple.     Comments: Pt is slow to rise from seated position to standing. Good  lumbar range of motion. Strong distal strength without clonus, no pain upon palpation of greater trochanters. Sensation intact bilaterally. Walks independently, gait steady.    Skin:    General: Skin is warm and dry.     Capillary Refill: Capillary refill takes less than 2 seconds.  Neurological:     General: No focal deficit present.     Mental Status: He is alert.  Psychiatric:        Mood and Affect: Mood normal.    Ortho Exam  Imaging: No results found.  Past Medical/Family/Surgical/Social History: Medications & Allergies reviewed per EMR, new medications updated. 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   Past Medical History:  Diagnosis Date   Coronary artery disease 06/04/2009   stenting to the mid LAD with Xience 2.5 x 32 and overlapping 2.5 x 12 mm DES, [place], [place]   Diabetes mellitus without complication (HCC)    Hyperlipidemia    Hypertension    Family History  Problem Relation Age of Onset   Leukemia Mother    Heart attack Brother    Lung cancer Brother    Past Surgical History:  Procedure Laterality Date   CARDIAC CATHETERIZATION  06/04/2009   Stent to LAD in [place], [place]   REPLACEMENT TOTAL KNEE     bOTH   ROTATOR CUFF REPAIR Bilateral    Social History   Occupational History   Not on file  Tobacco Use   Smoking status: Former    Packs/day: 1.00    Years: 10.00    Pack years: 10.00    Types: Cigarettes    Quit date: 03/10/1949    Years since quitting: 72.3   Smokeless tobacco: Never  Vaping Use   Vaping Use: Never used  Substance and Sexual Activity   Alcohol use: Yes    Comment: Drinks Socially   Drug use: Never   Sexual activity: Not on file

## 2021-06-30 NOTE — Progress Notes (Signed)
Low back pain radiating down sides of both legs to knee. Right side is worse. Sometimes has pain to lower leg on left. Relief for about a week following last injection.

## 2021-07-07 ENCOUNTER — Ambulatory Visit: Payer: Self-pay

## 2021-07-07 ENCOUNTER — Other Ambulatory Visit: Payer: Self-pay

## 2021-07-07 ENCOUNTER — Encounter: Payer: Self-pay | Admitting: Physical Medicine and Rehabilitation

## 2021-07-07 ENCOUNTER — Ambulatory Visit (INDEPENDENT_AMBULATORY_CARE_PROVIDER_SITE_OTHER): Payer: Medicare Other | Admitting: Physical Medicine and Rehabilitation

## 2021-07-07 VITALS — BP 112/71 | HR 86

## 2021-07-07 DIAGNOSIS — M5416 Radiculopathy, lumbar region: Secondary | ICD-10-CM | POA: Diagnosis not present

## 2021-07-07 DIAGNOSIS — M48062 Spinal stenosis, lumbar region with neurogenic claudication: Secondary | ICD-10-CM | POA: Diagnosis not present

## 2021-07-07 DIAGNOSIS — M961 Postlaminectomy syndrome, not elsewhere classified: Secondary | ICD-10-CM

## 2021-07-07 MED ORDER — METHYLPREDNISOLONE ACETATE 80 MG/ML IJ SUSP
80.0000 mg | Freq: Once | INTRAMUSCULAR | Status: AC
  Administered 2021-07-07: 80 mg

## 2021-07-07 NOTE — Patient Instructions (Signed)

## 2021-07-07 NOTE — Progress Notes (Signed)
Pt state lower back pain that travels to both knees and he has been feeling tingling in his right calf. Pt state getting out of bed makes the pain worse. Pt state he takes over the counter pain meds and rest by sitting. Pt has hx of inj on 05/31/21 pt state it didn't help.  Numeric Pain Rating Scale and Functional Assessment Average Pain 2   In the last MONTH (on 0-10 scale) has pain interfered with the following?  1. General activity like being  able to carry out your everyday physical activities such as walking, climbing stairs, carrying groceries, or moving a chair?  Rating(10)   +Driver, -BT, -Dye Allergies.

## 2021-07-23 NOTE — Progress Notes (Signed)
Jaime [name] - 87 y.o. male MRN [id number]  Date of birth: 10/28/1931  Office Visit Note: Visit Date: 07/07/2021 PCP: Jarome Matin, MD Referred by: Jarome Matin, MD  Subjective: Chief Complaint  Patient presents with   Lower Back - Pain   Left Knee - Pain   Right Knee - Pain   Right Leg - Tingling   HPI:  Jaime [name] is a 87 y.o. male who comes in today for planned Bilateral L3-4 Lumbar Transforaminal epidural steroid injection with fluoroscopic guidance.  The patient has failed conservative care including home exercise, medications, time and activity modification.  This injection will be diagnostic and hopefully therapeutic.  Please see requesting physician notes for further details and justification.   ROS Otherwise per HPI.  Assessment & Plan: Visit Diagnoses:    ICD-10-CM   1. Lumbar radiculopathy  M54.16 XR C-ARM NO REPORT    Epidural Steroid injection    methylPREDNISolone acetate (DEPO-MEDROL) injection 80 mg    2. Spinal stenosis of lumbar region with neurogenic claudication  M48.062 XR C-ARM NO REPORT    Epidural Steroid injection    methylPREDNISolone acetate (DEPO-MEDROL) injection 80 mg    3. Post laminectomy syndrome  M96.1 XR C-ARM NO REPORT    Epidural Steroid injection    methylPREDNISolone acetate (DEPO-MEDROL) injection 80 mg      Plan: No additional findings.   Meds & Orders:  Meds ordered this encounter  Medications   methylPREDNISolone acetate (DEPO-MEDROL) injection 80 mg    Orders Placed This Encounter  Procedures   XR C-ARM NO REPORT   Epidural Steroid injection    Follow-up: Return if symptoms worsen or fail to improve.   Procedures: No procedures performed  Lumbosacral Transforaminal Epidural Steroid Injection - Sub-Pedicular Approach with Fluoroscopic Guidance  Patient: Jaime [name]      Date of Birth: 1931-02-28 MRN: [id number] PCP: Jarome Matin, MD      Visit Date: 07/07/2021   Universal Protocol:     Date/Time: 07/07/2021  Consent Given By: the patient  Position: PRONE  Additional Comments: Vital signs were monitored before and after the procedure. Patient was prepped and draped in the usual sterile fashion. The correct patient, procedure, and site was verified.   Injection Procedure Details:   Procedure diagnoses: Lumbar radiculopathy [M54.16]    Meds Administered:  Meds ordered this encounter  Medications   methylPREDNISolone acetate (DEPO-MEDROL) injection 80 mg    Laterality: Bilateral  Location/Site: L3  Needle:5.0 in., 22 ga.  Short bevel or Quincke spinal needle  Needle Placement: Transforaminal  Findings:    -Comments: Excellent flow of contrast along the nerve, nerve root and into the epidural space.  Procedure Details: After squaring off the end-plates to get a true AP view, the C-arm was positioned so that an oblique view of the foramen as noted above was visualized. The target area is just inferior to the "nose of the scotty dog" or sub pedicular. The soft tissues overlying this structure were infiltrated with 2-3 ml. of 1% Lidocaine without Epinephrine.  The spinal needle was inserted toward the target using a "trajectory" view along the fluoroscope beam.  Under AP and lateral visualization, the needle was advanced so it did not puncture dura and was located close the 6 O'Clock position of the pedical in AP tracterory. Biplanar projections were used to confirm position. Aspiration was confirmed to be negative for CSF and/or blood. A 1-2 ml. volume of Isovue-250 was injected and flow of contrast was noted at  each level. Radiographs were obtained for documentation purposes.   After attaining the desired flow of contrast documented above, a 0.5 to 1.0 ml test dose of 0.25% Marcaine was injected into each respective transforaminal space.  The patient was observed for 90 seconds post injection.  After no sensory deficits were reported, and normal lower extremity  motor function was noted,   the above injectate was administered so that equal amounts of the injectate were placed at each foramen (level) into the transforaminal epidural space.   Additional Comments:  The patient tolerated the procedure well Dressing: 2 x 2 sterile gauze and Band-Aid    Post-procedure details: Patient was observed during the procedure. Post-procedure instructions were reviewed.  Patient left the clinic in stable condition.    Clinical History: MRI LUMBAR SPINE WITHOUT CONTRAST   TECHNIQUE: Multiplanar, multisequence MR imaging of the lumbar spine was performed. No intravenous contrast was administered.   COMPARISON:  Plain films Mar 23, 2020   FINDINGS: Segmentation:  Standard.   Alignment: Levoconvex scoliosis of the lumbar spine. Grade 1 anterolisthesis of L2 over L3 and L4 over L5 with minimal retrolisthesis of L5 over S1.   Vertebrae:  No fracture, evidence of discitis, or bone lesion.   Conus medullaris and cauda equina: Conus extends to the L1-2 level. Conus and cauda equina appear normal.   Paraspinal and other soft tissues: Negative.   Disc levels:   T12-L1: Disc bulge with associated osteophytic component, facet degenerative changes and ligamentum flavum redundancy resulting in mild spinal canal stenosis and moderate to severe bilateral neural foraminal narrowing.   L1-2: Disc bulge, facet degenerative changes and ligamentum flavum redundancy resulting in mild spinal canal stenosis and moderate bilateral neural foraminal narrowing.   L2-3: Loss of disc height, disc bulge, facet degenerative changes and prominent redundancy of the ligamentum flavum resulting in moderate spinal canal stenosis and mild bilateral neural foraminal narrowing.   L3-4: Prominent loss of disc height, disc bulge with associated osteophytic component, facet degenerative changes and ligamentum flavum redundancy resulting in moderate spinal canal  stenosis, moderate right and mild left neural foraminal narrowing.   L4-5: Prominent loss disc height, endplate ridging, prominent hypertrophic facet degenerative changes and ligamentum flavum redundancy resulting in severe spinal canal stenosis and severe bilateral neural foraminal narrowing.   L5-S1: Disc bulge, facet degenerative changes and ligamentum flavum redundancy resulting in narrowing of the bilateral subarticular zones, left greater than right and moderate bilateral neural foraminal narrowing.   IMPRESSION: 1. Multilevel degenerative changes of the lumbar spine as described above, worst at L4-5 where there is severe spinal canal stenosis and severe bilateral neural foraminal narrowing. 2. Moderate spinal canal stenosis at L2-3 and L3-4. 3. Moderate bilateral neural foraminal narrowing at L5-S1 with narrowing of the bilateral subarticular zones at this level. 4. Moderate to severe bilateral neural foraminal narrowing at T12-L1.     Electronically Signed   By: Baldemar Lenis M.D.   On: 08/19/2020 11:59     Objective:  VS:  HT:    WT:   BMI:     BP:112/71  HR:86bpm  TEMP: ( )  RESP:  Physical Exam Vitals and nursing note reviewed.  Constitutional:      General: He is not in acute distress.    Appearance: Normal appearance. He is not ill-appearing.  HENT:     Head: Normocephalic and atraumatic.     Right Ear: External ear normal.     Left Ear: External ear normal.  Nose: No congestion.  Eyes:     Extraocular Movements: Extraocular movements intact.  Cardiovascular:     Rate and Rhythm: Normal rate.     Pulses: Normal pulses.  Pulmonary:     Effort: Pulmonary effort is normal. No respiratory distress.  Abdominal:     General: There is no distension.     Palpations: Abdomen is soft.  Musculoskeletal:        General: No tenderness or signs of injury.     Cervical back: Neck supple.     Right lower leg: No edema.     Left lower leg:  No edema.     Comments: Patient has good distal strength without clonus.  Skin:    Findings: No erythema or rash.  Neurological:     General: No focal deficit present.     Mental Status: He is alert and oriented to person, place, and time.     Sensory: No sensory deficit.     Motor: No weakness or abnormal muscle tone.     Coordination: Coordination normal.  Psychiatric:        Mood and Affect: Mood normal.        Behavior: Behavior normal.     Imaging: No results found.

## 2021-07-23 NOTE — Procedures (Signed)
Lumbosacral Transforaminal Epidural Steroid Injection - Sub-Pedicular Approach with Fluoroscopic Guidance  Patient: Jaime [name]      Date of Birth: 12-Feb-1931 MRN: [id number] PCP: Jarome Matin, MD      Visit Date: 07/07/2021   Universal Protocol:    Date/Time: 07/07/2021  Consent Given By: the patient  Position: PRONE  Additional Comments: Vital signs were monitored before and after the procedure. Patient was prepped and draped in the usual sterile fashion. The correct patient, procedure, and site was verified.   Injection Procedure Details:   Procedure diagnoses: Lumbar radiculopathy [M54.16]    Meds Administered:  Meds ordered this encounter  Medications   methylPREDNISolone acetate (DEPO-MEDROL) injection 80 mg    Laterality: Bilateral  Location/Site: L3  Needle:5.0 in., 22 ga.  Short bevel or Quincke spinal needle  Needle Placement: Transforaminal  Findings:    -Comments: Excellent flow of contrast along the nerve, nerve root and into the epidural space.  Procedure Details: After squaring off the end-plates to get a true AP view, the C-arm was positioned so that an oblique view of the foramen as noted above was visualized. The target area is just inferior to the "nose of the scotty dog" or sub pedicular. The soft tissues overlying this structure were infiltrated with 2-3 ml. of 1% Lidocaine without Epinephrine.  The spinal needle was inserted toward the target using a "trajectory" view along the fluoroscope beam.  Under AP and lateral visualization, the needle was advanced so it did not puncture dura and was located close the 6 O'Clock position of the pedical in AP tracterory. Biplanar projections were used to confirm position. Aspiration was confirmed to be negative for CSF and/or blood. A 1-2 ml. volume of Isovue-250 was injected and flow of contrast was noted at each level. Radiographs were obtained for documentation purposes.   After attaining the  desired flow of contrast documented above, a 0.5 to 1.0 ml test dose of 0.25% Marcaine was injected into each respective transforaminal space.  The patient was observed for 90 seconds post injection.  After no sensory deficits were reported, and normal lower extremity motor function was noted,   the above injectate was administered so that equal amounts of the injectate were placed at each foramen (level) into the transforaminal epidural space.   Additional Comments:  The patient tolerated the procedure well Dressing: 2 x 2 sterile gauze and Band-Aid    Post-procedure details: Patient was observed during the procedure. Post-procedure instructions were reviewed.  Patient left the clinic in stable condition.

## 2021-08-08 ENCOUNTER — Encounter (INDEPENDENT_AMBULATORY_CARE_PROVIDER_SITE_OTHER): Payer: Medicare Other | Admitting: Ophthalmology

## 2021-08-09 ENCOUNTER — Encounter (INDEPENDENT_AMBULATORY_CARE_PROVIDER_SITE_OTHER): Payer: Medicare Other | Admitting: Ophthalmology

## 2021-08-16 ENCOUNTER — Encounter (INDEPENDENT_AMBULATORY_CARE_PROVIDER_SITE_OTHER): Payer: Self-pay | Admitting: Ophthalmology

## 2021-08-16 ENCOUNTER — Ambulatory Visit (INDEPENDENT_AMBULATORY_CARE_PROVIDER_SITE_OTHER): Payer: Medicare Other | Admitting: Ophthalmology

## 2021-08-16 ENCOUNTER — Other Ambulatory Visit: Payer: Self-pay

## 2021-08-16 ENCOUNTER — Encounter (INDEPENDENT_AMBULATORY_CARE_PROVIDER_SITE_OTHER): Payer: Medicare Other | Admitting: Ophthalmology

## 2021-08-16 DIAGNOSIS — H40002 Preglaucoma, unspecified, left eye: Secondary | ICD-10-CM

## 2021-08-16 DIAGNOSIS — I2510 Atherosclerotic heart disease of native coronary artery without angina pectoris: Secondary | ICD-10-CM

## 2021-08-16 DIAGNOSIS — H3411 Central retinal artery occlusion, right eye: Secondary | ICD-10-CM

## 2021-08-16 DIAGNOSIS — H4720 Unspecified optic atrophy: Secondary | ICD-10-CM

## 2021-08-16 NOTE — Assessment & Plan Note (Signed)
OD, expected atrophy from prior CRA O.  No complications

## 2021-08-16 NOTE — Progress Notes (Signed)
08/16/2021     CHIEF COMPLAINT Patient presents for  Chief Complaint  Patient presents with   Retina Follow Up      HISTORY OF PRESENT ILLNESS: Jaime [name] is a 87 y.o. male who presents to the clinic today for:   HPI     Retina Follow Up   Patient presents with  CRVO/BRVO.  In right eye.  This started 1 year ago.  Severity is mild.  Duration of 1 year.  Since onset it is stable.        Comments   1 year fu OU and FP/OCT Pt states VA OU stable since last visit. Pt denies FOL, floaters, or ocular pain OU.  A1C:6.8 YQM:VHQI not check      Last edited by Demetrios Loll, COA on 08/16/2021  9:26 AM.      Referring physician: Hurshel Party, OD [address]  [place] [address]  HISTORICAL INFORMATION:   Selected notes from the MEDICAL RECORD NUMBER       CURRENT MEDICATIONS: No current outpatient medications on file. (Ophthalmic Drugs)   No current facility-administered medications for this visit. (Ophthalmic Drugs)   Current Outpatient Medications (Other)  Medication Sig   aspirin EC 81 MG tablet Take 81 mg by mouth daily.   gabapentin (NEURONTIN) 300 MG capsule Take 300 mg by mouth daily.   ibuprofen (ADVIL) 400 MG tablet Take 400 mg by mouth every 6 (six) hours as needed.   JARDIANCE 25 MG TABS tablet Take 25 mg by mouth daily.   levothyroxine (SYNTHROID) 175 MCG tablet Take 175 mcg by mouth every other day.   Metoprolol Succinate 25 MG CS24 Take 25 mg by mouth.   mirabegron ER (MYRBETRIQ) 50 MG TB24 tablet Take 50 mg by mouth daily.   nitroGLYCERIN (NITROSTAT) 0.4 MG SL tablet Place 1 tablet (0.4 mg total) under the tongue every 5 (five) minutes as needed for up to 25 days for chest pain.   rosuvastatin (CRESTOR) 20 MG tablet Take 1 tablet by mouth at bedtime.   tamsulosin (FLOMAX) 0.4 MG CAPS capsule Take 1 capsule by mouth daily.   TRESIBA FLEXTOUCH 100 UNIT/ML FlexTouch Pen Inject 17 Units into the skin daily.   Vitamin D,  Cholecalciferol, 50 MCG (2000 UT) CAPS Take 1 capsule by mouth daily.   vitamin E 180 MG (400 UNITS) capsule Take 400 Units by mouth daily.   zolpidem (AMBIEN) 10 MG tablet Take 10 mg by mouth at bedtime as needed for sleep.   No current facility-administered medications for this visit. (Other)      REVIEW OF SYSTEMS:    ALLERGIES No Known Allergies  PAST MEDICAL HISTORY Past Medical History:  Diagnosis Date   Coronary artery disease 06/04/2009   stenting to the mid LAD with Xience 2.5 x 32 and overlapping 2.5 x 12 mm DES, [place], [place]   Diabetes mellitus without complication (HCC)    Hyperlipidemia    Hypertension    Past Surgical History:  Procedure Laterality Date   CARDIAC CATHETERIZATION  06/04/2009   Stent to LAD in [place], [place]   REPLACEMENT TOTAL KNEE     bOTH   ROTATOR CUFF REPAIR Bilateral     FAMILY HISTORY Family History  Problem Relation Age of Onset   Leukemia Mother    Heart attack Brother    Lung cancer Brother     SOCIAL HISTORY Social History   Tobacco Use   Smoking status: Former  Packs/day: 1.00    Years: 10.00    Pack years: 10.00    Types: Cigarettes    Quit date: 03/10/1949    Years since quitting: 72.4   Smokeless tobacco: Never  Vaping Use   Vaping Use: Never used  Substance Use Topics   Alcohol use: Yes    Comment: Drinks Socially   Drug use: Never         OPHTHALMIC EXAM:  Base Eye Exam     Visual Acuity (ETDRS)       Right Left   Dist cc HM 20/20 -1   Dist ph cc NI     Correction: Glasses         Tonometry (Tonopen, 9:31 AM)       Right Left   Pressure 12 13         Pupils       Pupils Dark Light Shape React APD   Right PERRL 4 3 Round Brisk None   Left PERRL 3 2 Round Brisk None         Visual Fields       Left Right    Full    Restrictions  Total superior temporal, superior nasal deficiencies; Partial outer inferior nasal deficiency         Extraocular Movement       Right Left     Full Full         Neuro/Psych     Oriented x3: Yes   Mood/Affect: Normal         Dilation     Both eyes: 1.0% Mydriacyl, 2.5% Phenylephrine @ 9:31 AM           Slit Lamp and Fundus Exam     External Exam       Right Left   External Normal Normal         Slit Lamp Exam       Right Left   Lids/Lashes Normal Normal   Conjunctiva/Sclera White and quiet White and quiet   Cornea Clear Clear   Anterior Chamber Deep and quiet Deep and quiet   Iris Round and reactive Round and reactive   Lens Centered posterior chamber intraocular lens Centered posterior chamber intraocular lens   Anterior Vitreous Normal Normal         Fundus Exam       Right Left   Posterior Vitreous Posterior vitreous detachment Posterior vitreous detachment   Disc 3+ Pallor Coving of vessels in, with possible proximal vasculature issues with mild atrophy appearance   C/D Ratio 0.95 0.75   Macula Normal Normal   Vessels Old central retinal artery occlusion right eye with thin attenuated arteries, no complications nor neovascularization Normal   Periphery Solitary peripheral hemorrhage temporally,, no neovascularization right eye Normal            IMAGING AND PROCEDURES  Imaging and Procedures for 08/16/21  OCT, Retina - OU - Both Eyes       Right Eye Quality was good. Scan locations included subfoveal. Progression has no prior data. Findings include abnormal foveal contour.   Left Eye Quality was good. Scan locations included subfoveal. Central Foveal Thickness: 279. Progression has no prior data. Findings include normal foveal contour.   Notes OD with diffuse macular atrophy from prior CRA O, stable  OS normal, with possible minor epiretinal membrane nasally with no distortion of the fovea     Color Fundus Photography Optos - OU - Both Eyes  Right Eye Progression has been stable. Disc findings include increased cup to disc ratio, pallor. Macula : normal  observations. Vessels : attenuated. Periphery : normal observations.   Left Eye Progression has been stable. Disc findings include increased cup to disc ratio. Macula : normal observations. Vessels : normal observations. Periphery : normal observations.   Notes No active retinopathy in either eye, optic nerve pallor stable from old retinal artery occlusion OD             ASSESSMENT/PLAN:  Optic atrophy, right eye OD, expected atrophy from prior CRA O.  No complications  Central retinal artery occlusion of right eye No anterior segment or peripheral retinal complications from prior CRA O.  Glaucoma suspect, left eye I do suggest follow-up with Dr. Cephus Richer and consideration of prophylactic lower over the intraocular pressure with a low side effect profile topical medication simply to enhance cilioretinal artery thus optic nerve perfusion left eye by lowering the intraocular pressure.  Patient reports having been told that has enlarged cup-to-disc ratio over the last 50 years in left eye however he has no data nor numbers to know whether this was at point 5.6 but currently now 8.75 with diffuse whitening and atrophy of the left eye I would suggest consideration of therapy simply to enhance perfusion of the optic nerve head     ICD-10-CM   1. Central retinal artery occlusion of right eye  H34.11 OCT, Retina - OU - Both Eyes    Color Fundus Photography Optos - OU - Both Eyes    2. 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 Glaucoma suspect, left eye  H40.002       1.  OD with no complications from cilioretinal artery and the past  2.  OS with minor finding of enlarged cup-to-disc ratio which may be physiologic however with no prior medical records to confirm this over the "50 years" time.  He has been told this.  Nonetheless the left eye clearly is likely to have proximal vascular compromise as witnessed by his previous CRA O OD.  Thus any attempt to lower the pressure in the  left eye without significant side effect profile would be appropriate to potentially enhance optic nerve head perfusion  3.  I do suggest follow-up with Dr. Cephus Richer for discussion of these issues  Ophthalmic Meds Ordered this visit:  No orders of the defined types were placed in this encounter.      Return in about 1 year (around 08/16/2022) for DILATE OU, COLOR FP, OCT.  There are no Patient Instructions on file for this visit.   Explained the diagnoses, plan, and follow up with the patient and they expressed understanding.  Patient expressed understanding of the importance of proper follow up care.   Alford Highland Jiovany Scheffel M.D. Diseases & Surgery of the Retina and Vitreous [facility] 08/16/21     Abbreviations: M myopia (nearsighted); A astigmatism; H hyperopia (farsighted); P presbyopia; Mrx spectacle prescription;  CTL contact lenses; OD right eye; OS left eye; OU both eyes  XT exotropia; ET esotropia; PEK punctate epithelial keratitis; PEE punctate epithelial erosions; DES dry eye syndrome; MGD meibomian gland dysfunction; ATs artificial tears; PFAT's preservative free artificial tears; NSC nuclear sclerotic cataract; PSC posterior subcapsular cataract; ERM epi-retinal membrane; PVD posterior vitreous detachment; RD retinal detachment; DM diabetes mellitus; DR diabetic retinopathy; NPDR non-proliferative diabetic retinopathy; PDR proliferative diabetic retinopathy; CSME clinically significant macular edema; DME diabetic macular edema;  dbh dot blot hemorrhages; CWS cotton wool spot; POAG primary open angle glaucoma; C/D cup-to-disc ratio; HVF humphrey visual field; GVF goldmann visual field; OCT optical coherence tomography; IOP intraocular pressure; BRVO Branch retinal vein occlusion; CRVO central retinal vein occlusion; CRAO central retinal artery occlusion; BRAO branch retinal artery occlusion; RT retinal tear; SB scleral buckle; PPV pars plana vitrectomy; VH Vitreous  hemorrhage; PRP panretinal laser photocoagulation; IVK intravitreal kenalog; VMT vitreomacular traction; MH Macular hole;  NVD neovascularization of the disc; NVE neovascularization elsewhere; AREDS age related eye disease study; ARMD age related macular degeneration; POAG primary open angle glaucoma; EBMD epithelial/anterior basement membrane dystrophy; ACIOL anterior chamber intraocular lens; IOL intraocular lens; PCIOL posterior chamber intraocular lens; Phaco/IOL phacoemulsification with intraocular lens placement; PRK photorefractive keratectomy; LASIK laser assisted in situ keratomileusis; HTN hypertension; DM diabetes mellitus; COPD chronic obstructive pulmonary disease

## 2021-08-16 NOTE — Assessment & Plan Note (Signed)
No anterior segment or peripheral retinal complications from prior CRA O.

## 2021-08-16 NOTE — Assessment & Plan Note (Addendum)
I do suggest follow-up with Dr. Cephus Richer and consideration of prophylactic lower over the intraocular pressure with a low side effect profile topical medication simply to enhance cilioretinal artery thus optic nerve perfusion left eye by lowering the intraocular pressure.  Patient reports having been told that has enlarged cup-to-disc ratio over the last 50 years in left eye however he has no data nor numbers to know whether this was at point 5.6 but currently now 8.75 with diffuse whitening and atrophy of the left eye I would suggest consideration of therapy simply to enhance perfusion of the optic nerve head

## 2021-08-30 ENCOUNTER — Encounter: Payer: Self-pay | Admitting: Physical Medicine and Rehabilitation

## 2021-08-30 ENCOUNTER — Ambulatory Visit (INDEPENDENT_AMBULATORY_CARE_PROVIDER_SITE_OTHER): Payer: Medicare Other | Admitting: Physical Medicine and Rehabilitation

## 2021-08-30 ENCOUNTER — Ambulatory Visit: Payer: Self-pay

## 2021-08-30 ENCOUNTER — Other Ambulatory Visit: Payer: Self-pay

## 2021-08-30 VITALS — BP 96/59 | HR 82

## 2021-08-30 DIAGNOSIS — M5416 Radiculopathy, lumbar region: Secondary | ICD-10-CM

## 2021-08-30 MED ORDER — METHYLPREDNISOLONE ACETATE 80 MG/ML IJ SUSP
80.0000 mg | Freq: Once | INTRAMUSCULAR | Status: AC
  Administered 2021-08-30: 80 mg

## 2021-08-30 NOTE — Progress Notes (Signed)
Pt state lower back pain that travels to both legs. Pt stat walking and standing makes the pain worse. Pt state he takes over the counter pain meds to help ease his pain. Pt has hx of inj on 07/07/21 pt state it helped.  Numeric Pain Rating Scale and Functional Assessment Average Pain 2   In the last MONTH (on 0-10 scale) has pain interfered with the following?  1. General activity like being  able to carry out your everyday physical activities such as walking, climbing stairs, carrying groceries, or moving a chair?  Rating(9)   +Driver, -BT, -Dye Allergies.

## 2021-08-30 NOTE — Patient Instructions (Signed)

## 2021-09-01 NOTE — Progress Notes (Signed)
Jaime [name] - 87 y.o. male MRN [id number]  Date of birth: 16-Jan-1931  Office Visit Note: Visit Date: 08/30/2021 PCP: Jarome Matin, MD Referred by: Jarome Matin, MD  Subjective: Chief Complaint  Patient presents with   Lower Back - Pain   Left Leg - Pain   Right Leg - Pain   HPI:  Jaime [name] is a 87 y.o. male who comes in today for planned repeat Bilateral L3-4  Lumbar Transforaminal epidural steroid injection with fluoroscopic guidance.  The patient has failed conservative care including home exercise, medications, time and activity modification.  This injection will be diagnostic and hopefully therapeutic.  Please see requesting physician notes for further details and justification. Patient received more than 50% pain relief from prior injection.   Referring: Self  ROS Otherwise per HPI.  Assessment & Plan: Visit Diagnoses:    ICD-10-CM   1. Lumbar radiculopathy  M54.16 XR C-ARM NO REPORT    Epidural Steroid injection    methylPREDNISolone acetate (DEPO-MEDROL) injection 80 mg      Plan: No additional findings.   Meds & Orders:  Meds ordered this encounter  Medications   methylPREDNISolone acetate (DEPO-MEDROL) injection 80 mg    Orders Placed This Encounter  Procedures   XR C-ARM NO REPORT   Epidural Steroid injection    Follow-up: Return if symptoms worsen or fail to improve.   Procedures: No procedures performed  Lumbosacral Transforaminal Epidural Steroid Injection - Sub-Pedicular Approach with Fluoroscopic Guidance  Patient: Jaime [name]      Date of Birth: [birth year] MRN: [id number] PCP: Jarome Matin, MD      Visit Date: 08/30/2021   Universal Protocol:    Date/Time: 08/30/2021  Consent Given By: the patient  Position: PRONE  Additional Comments: Vital signs were monitored before and after the procedure. Patient was prepped and draped in the usual sterile fashion. The correct patient, procedure, and site was  verified.   Injection Procedure Details:   Procedure diagnoses: Lumbar radiculopathy [M54.16]    Meds Administered:  Meds ordered this encounter  Medications   methylPREDNISolone acetate (DEPO-MEDROL) injection 80 mg    Laterality: Bilateral  Location/Site: L3  Needle:5.0 in., 22 ga.  Short bevel or Quincke spinal needle  Needle Placement: Transforaminal  Findings:    -Comments: Excellent flow of contrast along the nerve, nerve root and into the epidural space.  Procedure Details: After squaring off the end-plates to get a true AP view, the C-arm was positioned so that an oblique view of the foramen as noted above was visualized. The target area is just inferior to the "nose of the scotty dog" or sub pedicular. The soft tissues overlying this structure were infiltrated with 2-3 ml. of 1% Lidocaine without Epinephrine.  The spinal needle was inserted toward the target using a "trajectory" view along the fluoroscope beam.  Under AP and lateral visualization, the needle was advanced so it did not puncture dura and was located close the 6 O'Clock position of the pedical in AP tracterory. Biplanar projections were used to confirm position. Aspiration was confirmed to be negative for CSF and/or blood. A 1-2 ml. volume of Isovue-250 was injected and flow of contrast was noted at each level. Radiographs were obtained for documentation purposes.   After attaining the desired flow of contrast documented above, a 0.5 to 1.0 ml test dose of 0.25% Marcaine was injected into each respective transforaminal space.  The patient was observed for 90 seconds post injection.  After no sensory deficits were  reported, and normal lower extremity motor function was noted,   the above injectate was administered so that equal amounts of the injectate were placed at each foramen (level) into the transforaminal epidural space.   Additional Comments:  No complications occurred Dressing: 2 x 2 sterile gauze and  Band-Aid    Post-procedure details: Patient was observed during the procedure. Post-procedure instructions were reviewed.  Patient left the clinic in stable condition.     Clinical History: MRI LUMBAR SPINE WITHOUT CONTRAST   TECHNIQUE: Multiplanar, multisequence MR imaging of the lumbar spine was performed. No intravenous contrast was administered.   COMPARISON:  Plain films Mar 23, 2020   FINDINGS: Segmentation:  Standard.   Alignment: Levoconvex scoliosis of the lumbar spine. Grade 1 anterolisthesis of L2 over L3 and L4 over L5 with minimal retrolisthesis of L5 over S1.   Vertebrae:  No fracture, evidence of discitis, or bone lesion.   Conus medullaris and cauda equina: Conus extends to the L1-2 level. Conus and cauda equina appear normal.   Paraspinal and other soft tissues: Negative.   Disc levels:   T12-L1: Disc bulge with associated osteophytic component, facet degenerative changes and ligamentum flavum redundancy resulting in mild spinal canal stenosis and moderate to severe bilateral neural foraminal narrowing.   L1-2: Disc bulge, facet degenerative changes and ligamentum flavum redundancy resulting in mild spinal canal stenosis and moderate bilateral neural foraminal narrowing.   L2-3: Loss of disc height, disc bulge, facet degenerative changes and prominent redundancy of the ligamentum flavum resulting in moderate spinal canal stenosis and mild bilateral neural foraminal narrowing.   L3-4: Prominent loss of disc height, disc bulge with associated osteophytic component, facet degenerative changes and ligamentum flavum redundancy resulting in moderate spinal canal stenosis, moderate right and mild left neural foraminal narrowing.   L4-5: Prominent loss disc height, endplate ridging, prominent hypertrophic facet degenerative changes and ligamentum flavum redundancy resulting in severe spinal canal stenosis and severe bilateral neural foraminal  narrowing.   L5-S1: Disc bulge, facet degenerative changes and ligamentum flavum redundancy resulting in narrowing of the bilateral subarticular zones, left greater than right and moderate bilateral neural foraminal narrowing.   IMPRESSION: 1. Multilevel degenerative changes of the lumbar spine as described above, worst at L4-5 where there is severe spinal canal stenosis and severe bilateral neural foraminal narrowing. 2. Moderate spinal canal stenosis at L2-3 and L3-4. 3. Moderate bilateral neural foraminal narrowing at L5-S1 with narrowing of the bilateral subarticular zones at this level. 4. Moderate to severe bilateral neural foraminal narrowing at T12-L1.     Electronically Signed   By: Baldemar Lenis M.D.   On: 08/19/2020 11:59     Objective:  VS:  HT:    WT:   BMI:     BP:(!) 96/59  HR:82bpm  TEMP: ( )  RESP:  Physical Exam Vitals and nursing note reviewed.  Constitutional:      General: He is not in acute distress.    Appearance: Normal appearance. He is not ill-appearing.  HENT:     Head: Normocephalic and atraumatic.     Right Ear: External ear normal.     Left Ear: External ear normal.     Nose: No congestion.  Eyes:     Extraocular Movements: Extraocular movements intact.  Cardiovascular:     Rate and Rhythm: Normal rate.     Pulses: Normal pulses.  Pulmonary:     Effort: Pulmonary effort is normal. No respiratory distress.  Abdominal:  General: There is no distension.     Palpations: Abdomen is soft.  Musculoskeletal:        General: No tenderness or signs of injury.     Cervical back: Neck supple.     Right lower leg: No edema.     Left lower leg: No edema.     Comments: Patient has good distal strength without clonus.  Skin:    Findings: No erythema or rash.  Neurological:     General: No focal deficit present.     Mental Status: He is alert and oriented to person, place, and time.     Sensory: No sensory deficit.      Motor: No weakness or abnormal muscle tone.     Coordination: Coordination normal.  Psychiatric:        Mood and Affect: Mood normal.        Behavior: Behavior normal.     Imaging: No results found.

## 2021-09-07 NOTE — Procedures (Signed)
Lumbosacral Transforaminal Epidural Steroid Injection - Sub-Pedicular Approach with Fluoroscopic Guidance  Patient: Jaime [name]      Date of Birth: 01/18/1931 MRN: [id number] PCP: Jarome Matin, MD      Visit Date: 08/30/2021   Universal Protocol:    Date/Time: 08/30/2021  Consent Given By: the patient  Position: PRONE  Additional Comments: Vital signs were monitored before and after the procedure. Patient was prepped and draped in the usual sterile fashion. The correct patient, procedure, and site was verified.   Injection Procedure Details:   Procedure diagnoses: Lumbar radiculopathy [M54.16]    Meds Administered:  Meds ordered this encounter  Medications   methylPREDNISolone acetate (DEPO-MEDROL) injection 80 mg    Laterality: Bilateral  Location/Site: L3  Needle:5.0 in., 22 ga.  Short bevel or Quincke spinal needle  Needle Placement: Transforaminal  Findings:    -Comments: Excellent flow of contrast along the nerve, nerve root and into the epidural space.  Procedure Details: After squaring off the end-plates to get a true AP view, the C-arm was positioned so that an oblique view of the foramen as noted above was visualized. The target area is just inferior to the "nose of the scotty dog" or sub pedicular. The soft tissues overlying this structure were infiltrated with 2-3 ml. of 1% Lidocaine without Epinephrine.  The spinal needle was inserted toward the target using a "trajectory" view along the fluoroscope beam.  Under AP and lateral visualization, the needle was advanced so it did not puncture dura and was located close the 6 O'Clock position of the pedical in AP tracterory. Biplanar projections were used to confirm position. Aspiration was confirmed to be negative for CSF and/or blood. A 1-2 ml. volume of Isovue-250 was injected and flow of contrast was noted at each level. Radiographs were obtained for documentation purposes.   After attaining the  desired flow of contrast documented above, a 0.5 to 1.0 ml test dose of 0.25% Marcaine was injected into each respective transforaminal space.  The patient was observed for 90 seconds post injection.  After no sensory deficits were reported, and normal lower extremity motor function was noted,   the above injectate was administered so that equal amounts of the injectate were placed at each foramen (level) into the transforaminal epidural space.   Additional Comments:  No complications occurred Dressing: 2 x 2 sterile gauze and Band-Aid    Post-procedure details: Patient was observed during the procedure. Post-procedure instructions were reviewed.  Patient left the clinic in stable condition.

## 2021-10-06 ENCOUNTER — Ambulatory Visit: Payer: Medicare Other | Admitting: Cardiology

## 2021-10-06 ENCOUNTER — Encounter: Payer: Self-pay | Admitting: Cardiology

## 2021-10-06 ENCOUNTER — Other Ambulatory Visit: Payer: Self-pay

## 2021-10-06 VITALS — BP 134/73 | HR 73 | Temp 97.2°F | Resp 16 | Ht 66.5 in | Wt 161.4 lb

## 2021-10-06 DIAGNOSIS — E7800 Pure hypercholesterolemia, unspecified: Secondary | ICD-10-CM

## 2021-10-06 DIAGNOSIS — J209 Acute bronchitis, unspecified: Secondary | ICD-10-CM

## 2021-10-06 DIAGNOSIS — I2510 Atherosclerotic heart disease of native coronary artery without angina pectoris: Secondary | ICD-10-CM

## 2021-10-06 NOTE — Progress Notes (Signed)
EKG 10/06/2021: Normal sinus rhythm at rate of 71 bpm, inferior infarct old.  Right bundle branch block.  Poor R wave progression, cannot exclude anteroseptal infarct old.  Low-voltage complexes.  Nonspecific T abnormality.  Consider pulmonary disease pattern.  No significant change from 03/10/2021.

## 2021-10-06 NOTE — Progress Notes (Signed)
Primary Physician/Referring:  Leanna Battles, MD  Patient ID: Jaime [name], male    DOB: 23-Nov-1930, 87 y.o.   MRN: [id number]  Chief Complaint  Patient presents with   Coronary Artery Disease   Hyperlipidemia   Follow-up    6 months   HPI:    Jaime [name]  is a 87 y.o. 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  Since then he has not had any recurrence of angina pectoris.  Moved from [place] to be closer to his daughter in [place] since March 2021.Marland Kitchen  He also has history of small vessel PAD from diabetes mellitus with mildly decreased peripheral pulses.  Remains active.  His wife has MS and he is the active caregiver.  Except for having developed a cough about a week to 10 days ago he has no other symptoms.  He has not had any recurrence of angina pectoris.  He has remained active through the summer and played plenty of golf.  Past Medical History:  Diagnosis Date   Coronary artery disease 06/04/2009   stenting to the mid LAD with Xience 2.5 x 32 and overlapping 2.5 x 12 mm DES, [place], [place]   Diabetes mellitus without complication ([place])    Hyperlipidemia    Hypertension    Past Surgical History:  Procedure Laterality Date   CARDIAC CATHETERIZATION  06/04/2009   Stent to LAD in [place], [place]     bOTH   ROTATOR CUFF REPAIR Bilateral    Family History  Problem Relation Age of Onset   Leukemia Mother    Heart attack Brother    Lung cancer Brother     Social History   Tobacco Use   Smoking status: Former    Packs/day: 1.00    Years: 10.00    Pack years: 10.00    Types: Cigarettes    Quit date: 03/10/1949    Years since quitting: 72.6   Smokeless tobacco: Never  Substance Use Topics    Alcohol use: Yes    Comment: Drinks Socially   Marital Status:    ROS  Review of Systems  Constitutional: Negative for weight gain.  Cardiovascular:  Negative for dyspnea on exertion, leg swelling and syncope.  Respiratory:  Negative for shortness of breath.   Musculoskeletal:  Positive for back pain and muscle cramps (at night). Negative for joint swelling.  Objective  Blood pressure 134/73, pulse 73, temperature (!) 97.2 F (36.2 C), temperature source Temporal, resp. rate 16, height 5' 6.5" (1.689 m), weight 161 lb 6.4 oz (73.2 kg), SpO2 98 %.  Vitals with BMI 10/06/2021 10/06/2021 08/30/2021  Height - 5' 6.5" -  Weight - 161 lbs 6 oz -  BMI - 54.27 -  Systolic 062 376 96  Diastolic 73 76 59  Pulse 73 78 82     Physical Exam Constitutional:      Appearance: He is well-developed.  Neck:     Vascular: No carotid bruit or JVD.  Cardiovascular:     Rate and Rhythm: Normal rate and regular rhythm.     Pulses:          Carotid pulses are 2+ on the right side and 2+ on the left side.      Popliteal pulses are 2+  on the right side and 2+ on the left side.       Dorsalis pedis pulses are 1+ on the right side and 1+ on the left side.       Posterior tibial pulses are 1+ on the right side and 1+ on the left side.     Heart sounds: No murmur heard.   No gallop.  Pulmonary:     Effort: Pulmonary effort is normal. No accessory muscle usage or respiratory distress.     Breath sounds: Examination of the right-lower field reveals rhonchi. Examination of the left-lower field reveals rhonchi. Rhonchi present.  Abdominal:     Palpations: Abdomen is soft.  Musculoskeletal:     Right lower leg: No edema.     Left lower leg: No edema.   Laboratory examination:   External labs:   Labs 08/15/2021:  GLUCOSE 72   60 - 110 BUN 19   5 - 23 CREATININE 0.9   0.3 - 1.5 eGFR Non-African American 79.6   < eGFR African American 96.4   < SODIUM 140   135 - 148 POTASSIUM 4.4   3.5 -  5.3  HGB 13.4   12.0 - 18.0 HCT 44.3   37.0 - 51.0 MCV 64.0   80.0 - 97.0 MCH 19.4   26.0 - 32.0 MCHC 30.3   31.0 - 36.0 RDW 13.4   12.3 - 15.4 PLT 138   140 - 440  CHOLESTEROL 126   100 - 200 TRIGLYCERIDES 64   30 - 149 HDL 42   40 - 60 LDL 71   < CHOL/HDL RATIO 3.0   < LDL/HDL RATIO 1.7   1.7 - 2.5 NON-HDL 84.0   < Apolipoprotein B   2021-08-11   Apolipoprotein B 75.0   49.0 - 90.0  TSH   2021-08-11   TSH 5.84   0.40 - 4.20 FREE T4 1.2   0.8 - 1.8 A1C   2021-08-11   A1C 6.9   4.5 - 6.0   Medications and allergies  No Known Allergies   Current Outpatient Medications on File Prior to Visit  Medication Sig Dispense Refill   aspirin EC 81 MG tablet Take 81 mg by mouth daily.     gabapentin (NEURONTIN) 300 MG capsule Take 300 mg by mouth daily.     ibuprofen (ADVIL) 400 MG tablet Take 400 mg by mouth every 6 (six) hours as needed.     JARDIANCE 25 MG TABS tablet Take 25 mg by mouth daily.     levothyroxine (SYNTHROID) 175 MCG tablet Take 175 mcg by mouth every other day.     Metoprolol Succinate 25 MG CS24 Take 25 mg by mouth.     mirabegron ER (MYRBETRIQ) 50 MG TB24 tablet Take 50 mg by mouth daily.     nitroGLYCERIN (NITROSTAT) 0.4 MG SL tablet Place 1 tablet (0.4 mg total) under the tongue every 5 (five) minutes as needed for up to 25 days for chest pain. 25 tablet 3   rosuvastatin (CRESTOR) 20 MG tablet Take 1 tablet by mouth at bedtime.     silodosin (RAPAFLO) 4 MG CAPS capsule Take 4 mg by mouth daily with breakfast.     TRESIBA FLEXTOUCH 100 UNIT/ML FlexTouch Pen Inject 17 Units into the skin daily.     Vitamin D, Cholecalciferol, 50 MCG (2000 UT) CAPS Take 1 capsule by mouth daily.     vitamin E 180 MG (400 UNITS) capsule Take 400 Units by mouth daily.  zolpidem (AMBIEN) 10 MG tablet Take 10 mg by mouth at bedtime as needed for sleep.     No current facility-administered medications on file prior to visit.    Radiology:   No results found.  Cardiac  Studies:  Echocardiogram 04/20/2020: Left ventricle cavity is normal in size. Moderate concentric hypertrophy of the left ventricle. Normal global wall motion. Normal LV systolic function with EF 59%. Doppler evidence of grade I (impaired) diastolic dysfunction, normal LAP. Calculated EF 59%. Left atrial cavity is severely dilated at 51 cc/m2. No aortic valve regurgitation. Mild aortic valve leaflet calcification. Mildly restricted aortic valve leaflets. Mild mitral valve leaflet calcification. Mildly restricted mitral valve leaflets. Mild (Grade I) mitral regurgitation. Mild tricuspid regurgitation. Estimated pulmonary artery systolic pressure is 25 mmHg.   EKG  EKG 10/06/2021: Normal sinus rhythm at rate of 71 bpm, inferior infarct old.  Right bundle branch block.  Poor R wave progression, cannot exclude anteroseptal infarct old.  Low-voltage complexes.  Nonspecific T abnormality.  Consider pulmonary disease pattern.  No significant change from 03/10/2021.  Assessment     ICD-10-CM   1. Coronary artery disease involving native coronary artery of native heart without angina pectoris  I25.10 EKG 12-Lead    2. Pure hypercholesterolemia  E78.00     3. Acute bronchitis, unspecified organism  J20.9       No orders of the defined types were placed in this encounter.   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  Since then he has not had any recurrence of angina pectoris.  Recently moved from [place] to be closer to his daughter in [place].  This is a 61-monthoffice visit and  follow-up.  I reviewed his external labs performed by his PCP, lipids under excellent control, renal function is normal and he does have thalassemia minor and mild chronic anemia but stable overall.  Chronically elevated serum bilirubin is stable for him.  He remains asymptomatic, no clinical evidence of heart failure and no recurrence of angina pectoris.    No change in his EKG or his physical exam.  He does have bibasilar coarse crackles, he has recently developed a cough and cold that started about a week ago to 10 days ago which he states that he is recuperating well.  Advised him that if he starts having fever or foul-smelling expectoration, or not feeling well again, to contact Dr. PPhilip Aspen  Otherwise he remains stable from cardiac standpoint, I will see him back in a year or sooner if problems.  [place].  [place]  External labs reviewed.   JAdrian Prows MD, [facility] [address]12/11/2020, 10:09 AM Office: [phone]: [phone]

## 2021-11-10 ENCOUNTER — Telehealth: Payer: Self-pay | Admitting: Physical Medicine and Rehabilitation

## 2021-11-10 NOTE — Telephone Encounter (Signed)
Patient called needing to schedule an appointment with Dr Alvester Morin for his back. The number to contact patient is [phone]

## 2021-11-11 ENCOUNTER — Telehealth: Payer: Self-pay | Admitting: Physical Medicine and Rehabilitation

## 2021-11-11 NOTE — Telephone Encounter (Signed)
Pt called about an update for scheduling an appt. Please call pt at [phone].

## 2021-11-17 ENCOUNTER — Ambulatory Visit (INDEPENDENT_AMBULATORY_CARE_PROVIDER_SITE_OTHER): Payer: Medicare Other | Admitting: Physical Medicine and Rehabilitation

## 2021-11-17 ENCOUNTER — Ambulatory Visit: Payer: Self-pay

## 2021-11-17 ENCOUNTER — Other Ambulatory Visit: Payer: Self-pay

## 2021-11-17 ENCOUNTER — Encounter: Payer: Self-pay | Admitting: Physical Medicine and Rehabilitation

## 2021-11-17 VITALS — BP 116/64 | HR 80

## 2021-11-17 DIAGNOSIS — M5416 Radiculopathy, lumbar region: Secondary | ICD-10-CM

## 2021-11-17 MED ORDER — METHYLPREDNISOLONE ACETATE 80 MG/ML IJ SUSP
80.0000 mg | Freq: Once | INTRAMUSCULAR | Status: AC
  Administered 2021-11-17: 80 mg

## 2021-11-17 NOTE — Patient Instructions (Signed)

## 2021-11-17 NOTE — Progress Notes (Signed)
Pt state lower back pain that travels to both legs. Pt stat walking and standing makes the pain worse. Pt state he takes over the counter pain meds to help ease his pain.  Numeric Pain Rating Scale and Functional Assessment Average Pain 2   In the last MONTH (on 0-10 scale) has pain interfered with the following?  1. General activity like being  able to carry out your everyday physical activities such as walking, climbing stairs, carrying groceries, or moving a chair?  Rating(9)   +Driver, -BT, -Dye Allergies.

## 2021-11-30 ENCOUNTER — Telehealth: Payer: Self-pay | Admitting: Physical Medicine and Rehabilitation

## 2021-11-30 NOTE — Telephone Encounter (Signed)
Pt called. States the pain in L leg has returned. He would like to come in to talk about options.  CB [phone]

## 2021-12-05 ENCOUNTER — Other Ambulatory Visit: Payer: Self-pay

## 2021-12-05 ENCOUNTER — Ambulatory Visit (INDEPENDENT_AMBULATORY_CARE_PROVIDER_SITE_OTHER): Payer: Medicare Other | Admitting: Physical Medicine and Rehabilitation

## 2021-12-05 ENCOUNTER — Encounter: Payer: Self-pay | Admitting: Physical Medicine and Rehabilitation

## 2021-12-05 VITALS — BP 135/87 | HR 72

## 2021-12-05 DIAGNOSIS — G894 Chronic pain syndrome: Secondary | ICD-10-CM | POA: Diagnosis not present

## 2021-12-05 DIAGNOSIS — M48062 Spinal stenosis, lumbar region with neurogenic claudication: Secondary | ICD-10-CM | POA: Diagnosis not present

## 2021-12-05 DIAGNOSIS — M961 Postlaminectomy syndrome, not elsewhere classified: Secondary | ICD-10-CM

## 2021-12-05 DIAGNOSIS — M5416 Radiculopathy, lumbar region: Secondary | ICD-10-CM | POA: Diagnosis not present

## 2021-12-05 DIAGNOSIS — M47816 Spondylosis without myelopathy or radiculopathy, lumbar region: Secondary | ICD-10-CM

## 2021-12-05 MED ORDER — GABAPENTIN 300 MG PO CAPS: 300.0000 mg | ORAL_CAPSULE | Freq: Every day | ORAL | 1 refills | Status: AC

## 2021-12-05 NOTE — Progress Notes (Signed)
Pt state lower back pain that travels down his left ankle. Pt state sitting, walking and standing makes the pain worse. Pt has hx of inj on 11/17/21 pt state it helped for two weeks.  Numeric Pain Rating Scale and Functional Assessment Average Pain 9 Pain Right Now 2 My pain is intermittent, sharp, dull, tingling, and aching Pain is worse with: walking, sitting, standing, and some activites Pain improves with: medication and injections   In the last MONTH (on 0-10 scale) has pain interfered with the following?  1. General activity like being  able to carry out your everyday physical activities such as walking, climbing stairs, carrying groceries, or moving a chair?  Rating(6)  2. Relation with others like being able to carry out your usual social activities and roles such as  activities at home, at work and in your community. Rating(7)  3. Enjoyment of life such that you have  been bothered by emotional problems such as feeling anxious, depressed or irritable?  Rating(8)

## 2021-12-05 NOTE — Progress Notes (Signed)
Jaime [name] - 88 y.o. male MRN [id number]  Date of birth: 1930-12-19  Office Visit Note: Visit Date: 12/05/2021 PCP: [name], MD Referred by: [name], MD  Subjective: Chief Complaint  Patient presents with   Lower Back - Pain   Left Knee - Pain   HPI: Jaime [name] is a 88 y.o. male who comes in today for evaluation of chronic recalcitrant, worsening and severe bilateral lower back pain radiating down both legs, left greater than right. Patient reports pain is exacerbated by prolonged walking, standing and sitting. Patient describes his pain as a numbness/tingling sensation and often times does improve after several minutes of walking. He currently rates pain as 8 out of 10. Patient reports some relief of pain from home exercise regimen, rest and use of medications. Patient continues to take Gabapentin at bedtime. Patient's lumbar MRI from 2021 exhibits multi-level degenerative changes, severe spinal stenosis and bilateral foraminal narrowing at L4-L5 and moderate spinal stenosis at L2-L4 and L3-L4. Bilateral foraminal narrowing also noted at L5-S1. Patient has a history of laminectomy at L4-L5 and L5-S1, he had this procedure performed in [place] several years ago. Patient had bilateral L3 transforaminal epidural steroid injection on 11/17/2021 and reports 100% relief of pain for 1.5 weeks. Patient has a history of multiple lumbar epidural steroid injections performed by Dr. Magnus Sinning and reports significant pain relief lasting approximately 3-4 months only on a few occasions with worsening of late. Patient states he would like to discuss repeating injection and other possible treatment options. Patient denies focal weakness. Patient denies recent trauma or falls.   Review of Systems  Musculoskeletal:  Positive for back pain.  Neurological:  Positive for tingling. Negative for focal weakness and weakness.  All other systems reviewed and are negative. Otherwise  per HPI.  Assessment & Plan: Visit Diagnoses:    ICD-10-CM   1. Lumbar radiculopathy  M54.16 Ambulatory referral to Physical Medicine Rehab    Ambulatory referral to Psychology    2. Post laminectomy syndrome  M96.1 Ambulatory referral to Psychology    3. Chronic pain syndrome  G89.4 Ambulatory referral to Psychology    4. Spinal stenosis of lumbar region with neurogenic claudication  M48.062 Ambulatory referral to Physical Medicine Rehab    5. Facet arthropathy, lumbar  M47.816        Plan: Findings:  Chronic recalcitrant and worsening and severe bilateral lower back pain radiating to both legs, left greater than right.  Patient continues to have excruciating and debilitating pain despite good conservative therapies such as home exercise regimen, rest and use of medications. Patients clinical presentation and exam are consistent with neurogenic claudication as a result of spinal canal stenosis. Patient did get good pain relief relief with recent bilateral L3 transforaminal epidural steroid injection, however pain relief was short lived. Bilateral L3 transforaminal epidural injection images look good, however this injection was difficult to perform due to multi-level stenosis. We believe the next step is to perform a diagnostic and hopefully therapeutic left L2-L3 interlaminar epidural steroid injection under fluoroscopic guidance.  That particular injection in the past has helped him but it is very difficult to complete given his degenerative spine anatomy.  Patient is not currently undergoing long term anticoagulant therapy. We also spoke with patient in detail regarding spinal cord stimulator. Patient states he would like to move forward with this procedure.  This would essentially be a procedure of last resort for him.  I do think even at his advanced  age of 39 he would be a good candidate for this as he has to take care of his wife who has multiple medical issues.  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  We will arrange for patient to speak with Tillie Rung with [facility] to answer any further questions and assist patient with scheduling neuropsych evaluation. We did provide patient with educational literature on spinal cord stimulator placement for him to take home and review. We also briefly spoke with patient about surgical consultation, however he does not wish to pursue surgical intervention at this time. Patients gabapentin prescription refilled per his request today. We feel that we can get patient in quickly for epidural steroid injection. No red flag symptoms noted upon exam today.    Meds & Orders:  Meds ordered this encounter  Medications   gabapentin (NEURONTIN) 300 MG capsule    Sig: Take 1 capsule (300 mg total) by mouth at bedtime.    Dispense:  90 capsule    Refill:  1    Order Specific Question:   Supervising Provider    Answer:   Magnus Sinning [id number]    Orders Placed This Encounter  Procedures   Ambulatory referral to Physical Medicine Rehab   Ambulatory referral to Psychology    Follow-up: Return for Left L2-L3 interlaminar epidural steroid injection.   Procedures: No procedures performed      Clinical History: MRI LUMBAR SPINE WITHOUT CONTRAST   TECHNIQUE: Multiplanar, multisequence MR imaging of the lumbar spine was performed. No intravenous contrast was administered.   COMPARISON:  Plain films Mar 23, 2020   FINDINGS: Segmentation:  Standard.   Alignment: Levoconvex scoliosis of the lumbar spine. Grade 1 anterolisthesis of L2 over L3 and L4 over L5 with minimal retrolisthesis of L5 over S1.   Vertebrae:  No fracture, evidence of discitis, or bone lesion.   Conus medullaris and cauda equina: Conus extends to the L1-2 level. Conus and cauda equina appear normal.   Paraspinal and other soft tissues: Negative.   Disc levels:   T12-L1: Disc bulge with  associated osteophytic component, facet degenerative changes and ligamentum flavum redundancy resulting in mild spinal canal stenosis and moderate to severe bilateral neural foraminal narrowing.   L1-2: Disc bulge, facet degenerative changes and ligamentum flavum redundancy resulting in mild spinal canal stenosis and moderate bilateral neural foraminal narrowing.   L2-3: Loss of disc height, disc bulge, facet degenerative changes and prominent redundancy of the ligamentum flavum resulting in moderate spinal canal stenosis and mild bilateral neural foraminal narrowing.   L3-4: Prominent loss of disc height, disc bulge with associated osteophytic component, facet degenerative changes and ligamentum flavum redundancy resulting in moderate spinal canal stenosis, moderate right and mild left neural foraminal narrowing.   L4-5: Prominent loss disc height, endplate ridging, prominent hypertrophic facet degenerative changes and ligamentum flavum redundancy resulting in severe spinal canal stenosis and severe bilateral neural foraminal narrowing.   L5-S1: Disc bulge, facet degenerative changes and ligamentum flavum redundancy resulting in narrowing of the bilateral subarticular zones, left greater than right and moderate bilateral neural foraminal narrowing.   IMPRESSION: 1. Multilevel degenerative changes of the lumbar spine as described above, worst at L4-5 where there is severe spinal canal stenosis and severe bilateral neural foraminal narrowing. 2. Moderate spinal canal stenosis at L2-3 and L3-4. 3. Moderate bilateral neural foraminal narrowing at L5-S1 with narrowing of the bilateral subarticular zones at this  level. 4. Moderate to severe bilateral neural foraminal narrowing at T12-L1.     Electronically Signed   By: Pedro Earls M.D.   On: 08/19/2020 11:59   He reports that he quit smoking about 72 years ago. His smoking use included cigarettes. He has a  10.00 pack-year smoking history. He has never used smokeless tobacco. No results for input(s): HGBA1C, LABURIC in the last 8760 hours.  Objective:  VS:  HT:     WT:    BMI:      BP:135/87   HR:72bpm   TEMP: ( )   RESP:  Physical Exam Vitals and nursing note reviewed.  HENT:     Head: Normocephalic and atraumatic.     Right Ear: External ear normal.     Left Ear: External ear normal.     Nose: Nose normal.     Mouth/Throat:     Mouth: Mucous membranes are moist.  Eyes:     Extraocular Movements: Extraocular movements intact.  Cardiovascular:     Rate and Rhythm: Normal rate.     Pulses: Normal pulses.  Pulmonary:     Effort: Pulmonary effort is normal.  Abdominal:     General: Abdomen is flat. There is no distension.  Musculoskeletal:        General: Tenderness present.     Cervical back: Normal range of motion.     Comments: Pt is slow to rise from seated position to standing. Good lumbar range of motion. Strong distal strength without clonus, no pain upon palpation of greater trochanters. Sensation intact bilaterally. Walks independently, gait steady.   Skin:    General: Skin is warm and dry.     Capillary Refill: Capillary refill takes less than 2 seconds.  Neurological:     General: No focal deficit present.     Mental Status: He is alert and oriented to person, place, and time.  Psychiatric:        Mood and Affect: Mood normal.    Ortho Exam  Imaging: No results found.  Past Medical/Family/Surgical/Social History: Medications & Allergies reviewed per EMR, new medications updated. 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   Past Medical History:  Diagnosis Date   Coronary  artery disease 06/04/2009   stenting to the mid LAD with Xience 2.5 x 32 and overlapping 2.5 x 12 mm DES, [place], [place]   Diabetes mellitus without complication ([place])    Hyperlipidemia    Hypertension    Family History  Problem Relation Age of Onset   Leukemia Mother    Heart attack Brother    Lung cancer Brother    Past Surgical History:  Procedure Laterality Date   CARDIAC CATHETERIZATION  06/04/2009   Stent to LAD in [place], [place]     bOTH   ROTATOR CUFF REPAIR Bilateral    Social History   Occupational History   Not on file  Tobacco Use   Smoking status: Former    Packs/day: 1.00    Years: 10.00    Pack years: 10.00    Types: Cigarettes    Quit date: 03/10/1949    Years since quitting: 72.7   Smokeless tobacco: Never  Vaping Use   Vaping Use: Never used  Substance and Sexual Activity   Alcohol use: Yes    Comment: Drinks Socially   Drug use: Never   Sexual activity: Not on file

## 2021-12-10 NOTE — Progress Notes (Signed)
Jaime [name] - 88 y.o. male MRN [id number]  Date of birth: February 24, 1931  Office Visit Note: Visit Date: 11/17/2021 PCP: [name], MD Referred by: [name], MD  Subjective: Chief Complaint  Patient presents with   Left Leg - Pain   Lower Back - Pain   Right Leg - Pain   HPI:  Jaime [name] is a 88 y.o. male who comes in today for planned repeat Bilateral L3-4  Lumbar Transforaminal epidural steroid injection with fluoroscopic guidance.  The patient has failed conservative care including home exercise, medications, time and activity modification.  This injection will be diagnostic and hopefully therapeutic.  Please see requesting physician notes for further details and justification. Patient received more than 50% pain relief from prior injection.  He has multilevel severe stenosis and degenerative spine with prior lumbar laminectomies.  Very difficult injection at times in the foramen do to pain getting close to the nerve root.  Referring: Dr. Valetta Fuller   ROS Otherwise per HPI.  Assessment & Plan: Visit Diagnoses:    ICD-10-CM   1. Lumbar radiculopathy  M54.16 XR C-ARM NO REPORT    Epidural Steroid injection    methylPREDNISolone acetate (DEPO-MEDROL) injection 80 mg      Plan: No additional findings.   Meds & Orders:  Meds ordered this encounter  Medications   methylPREDNISolone acetate (DEPO-MEDROL) injection 80 mg    Orders Placed This Encounter  Procedures   XR C-ARM NO REPORT   Epidural Steroid injection    Follow-up: Return in about 3 months (around 02/15/2022).   Procedures: No procedures performed  Lumbosacral Transforaminal Epidural Steroid Injection - Sub-Pedicular Approach with Fluoroscopic Guidance  Patient: Jaime [name]      Date of Birth: 1931/06/20 MRN: [id number] PCP: [name], MD      Visit Date: 11/17/2021   Universal Protocol:    Date/Time: 11/17/2021  Consent Given By: the patient  Position:  PRONE  Additional Comments: Vital signs were monitored before and after the procedure. Patient was prepped and draped in the usual sterile fashion. The correct patient, procedure, and site was verified.   Injection Procedure Details:   Procedure diagnoses: Lumbar radiculopathy [M54.16]    Meds Administered:  Meds ordered this encounter  Medications   methylPREDNISolone acetate (DEPO-MEDROL) injection 80 mg    Laterality: Bilateral  Location/Site: L3  Needle:5.0 in., 22 ga.  Short bevel or Quincke spinal needle  Needle Placement: Transforaminal  Findings:    -Comments: Excellent flow of contrast along the nerve, nerve root and into the epidural space.  Procedure Details: After squaring off the end-plates to get a true AP view, the C-arm was positioned so that an oblique view of the foramen as noted above was visualized. The target area is just inferior to the "nose of the scotty dog" or sub pedicular. The soft tissues overlying this structure were infiltrated with 2-3 ml. of 1% Lidocaine without Epinephrine.  The spinal needle was inserted toward the target using a "trajectory" view along the fluoroscope beam.  Under AP and lateral visualization, the needle was advanced so it did not puncture dura and was located close the 6 O'Clock position of the pedical in AP tracterory. Biplanar projections were used to confirm position. Aspiration was confirmed to be negative for CSF and/or blood. A 1-2 ml. volume of Isovue-250 was injected and flow of contrast was noted at each level. Radiographs were obtained for documentation purposes.   After attaining the desired flow of contrast  documented above, a 0.5 to 1.0 ml test dose of 0.25% Marcaine was injected into each respective transforaminal space.  The patient was observed for 90 seconds post injection.  After no sensory deficits were reported, and normal lower extremity motor function was noted,   the above injectate was administered so  that equal amounts of the injectate were placed at each foramen (level) into the transforaminal epidural space.   Additional Comments:  No complications occurred Dressing: 2 x 2 sterile gauze and Band-Aid    Post-procedure details: Patient was observed during the procedure. Post-procedure instructions were reviewed.  Patient left the clinic in stable condition.    Clinical History: MRI LUMBAR SPINE WITHOUT CONTRAST   TECHNIQUE: Multiplanar, multisequence MR imaging of the lumbar spine was performed. No intravenous contrast was administered.   COMPARISON:  Plain films Mar 23, 2020   FINDINGS: Segmentation:  Standard.   Alignment: Levoconvex scoliosis of the lumbar spine. Grade 1 anterolisthesis of L2 over L3 and L4 over L5 with minimal retrolisthesis of L5 over S1.   Vertebrae:  No fracture, evidence of discitis, or bone lesion.   Conus medullaris and cauda equina: Conus extends to the L1-2 level. Conus and cauda equina appear normal.   Paraspinal and other soft tissues: Negative.   Disc levels:   T12-L1: Disc bulge with associated osteophytic component, facet degenerative changes and ligamentum flavum redundancy resulting in mild spinal canal stenosis and moderate to severe bilateral neural foraminal narrowing.   L1-2: Disc bulge, facet degenerative changes and ligamentum flavum redundancy resulting in mild spinal canal stenosis and moderate bilateral neural foraminal narrowing.   L2-3: Loss of disc height, disc bulge, facet degenerative changes and prominent redundancy of the ligamentum flavum resulting in moderate spinal canal stenosis and mild bilateral neural foraminal narrowing.   L3-4: Prominent loss of disc height, disc bulge with associated osteophytic component, facet degenerative changes and ligamentum flavum redundancy resulting in moderate spinal canal stenosis, moderate right and mild left neural foraminal narrowing.   L4-5: Prominent loss disc  height, endplate ridging, prominent hypertrophic facet degenerative changes and ligamentum flavum redundancy resulting in severe spinal canal stenosis and severe bilateral neural foraminal narrowing.   L5-S1: Disc bulge, facet degenerative changes and ligamentum flavum redundancy resulting in narrowing of the bilateral subarticular zones, left greater than right and moderate bilateral neural foraminal narrowing.   IMPRESSION: 1. Multilevel degenerative changes of the lumbar spine as described above, worst at L4-5 where there is severe spinal canal stenosis and severe bilateral neural foraminal narrowing. 2. Moderate spinal canal stenosis at L2-3 and L3-4. 3. Moderate bilateral neural foraminal narrowing at L5-S1 with narrowing of the bilateral subarticular zones at this level. 4. Moderate to severe bilateral neural foraminal narrowing at T12-L1.     Electronically Signed   By: Pedro Earls M.D.   On: 08/19/2020 11:59     Objective:  VS:  HT:     WT:    BMI:      BP:116/64   HR:80bpm   TEMP: ( )   RESP:  Physical Exam Vitals and nursing note reviewed.  Constitutional:      General: He is not in acute distress.    Appearance: Normal appearance. He is not ill-appearing.  HENT:     Head: Normocephalic and atraumatic.     Right Ear: External ear normal.     Left Ear: External ear normal.     Nose: No congestion.  Eyes:     Extraocular Movements:  Extraocular movements intact.  Cardiovascular:     Rate and Rhythm: Normal rate.     Pulses: Normal pulses.  Pulmonary:     Effort: Pulmonary effort is normal. No respiratory distress.  Abdominal:     General: There is no distension.     Palpations: Abdomen is soft.  Musculoskeletal:        General: No tenderness or signs of injury.     Cervical back: Neck supple.     Right lower leg: No edema.     Left lower leg: No edema.     Comments: Patient has good distal strength without clonus.  Skin:    Findings: No  erythema or rash.  Neurological:     General: No focal deficit present.     Mental Status: He is alert and oriented to person, place, and time.     Sensory: No sensory deficit.     Motor: No weakness or abnormal muscle tone.     Coordination: Coordination normal.  Psychiatric:        Mood and Affect: Mood normal.        Behavior: Behavior normal.     Imaging: No results found.

## 2021-12-10 NOTE — Procedures (Signed)
Lumbosacral Transforaminal Epidural Steroid Injection - Sub-Pedicular Approach with Fluoroscopic Guidance  Patient: Jaime [name]      Date of Birth: Sep 25, 1931 MRN: [id number] PCP: Garlan Fillers, MD      Visit Date: 11/17/2021   Universal Protocol:    Date/Time: 11/17/2021  Consent Given By: the patient  Position: PRONE  Additional Comments: Vital signs were monitored before and after the procedure. Patient was prepped and draped in the usual sterile fashion. The correct patient, procedure, and site was verified.   Injection Procedure Details:   Procedure diagnoses: Lumbar radiculopathy [M54.16]    Meds Administered:  Meds ordered this encounter  Medications   methylPREDNISolone acetate (DEPO-MEDROL) injection 80 mg    Laterality: Bilateral  Location/Site: L3  Needle:5.0 in., 22 ga.  Short bevel or Quincke spinal needle  Needle Placement: Transforaminal  Findings:    -Comments: Excellent flow of contrast along the nerve, nerve root and into the epidural space.  Procedure Details: After squaring off the end-plates to get a true AP view, the C-arm was positioned so that an oblique view of the foramen as noted above was visualized. The target area is just inferior to the "nose of the scotty dog" or sub pedicular. The soft tissues overlying this structure were infiltrated with 2-3 ml. of 1% Lidocaine without Epinephrine.  The spinal needle was inserted toward the target using a "trajectory" view along the fluoroscope beam.  Under AP and lateral visualization, the needle was advanced so it did not puncture dura and was located close the 6 O'Clock position of the pedical in AP tracterory. Biplanar projections were used to confirm position. Aspiration was confirmed to be negative for CSF and/or blood. A 1-2 ml. volume of Isovue-250 was injected and flow of contrast was noted at each level. Radiographs were obtained for documentation purposes.   After attaining the  desired flow of contrast documented above, a 0.5 to 1.0 ml test dose of 0.25% Marcaine was injected into each respective transforaminal space.  The patient was observed for 90 seconds post injection.  After no sensory deficits were reported, and normal lower extremity motor function was noted,   the above injectate was administered so that equal amounts of the injectate were placed at each foramen (level) into the transforaminal epidural space.   Additional Comments:  No complications occurred Dressing: 2 x 2 sterile gauze and Band-Aid    Post-procedure details: Patient was observed during the procedure. Post-procedure instructions were reviewed.  Patient left the clinic in stable condition.

## 2021-12-13 NOTE — Telephone Encounter (Signed)
Patient called and wants a call back today to discuss about a scientific rep ?

## 2021-12-15 ENCOUNTER — Ambulatory Visit: Payer: Medicare Other | Admitting: Physical Medicine and Rehabilitation

## 2021-12-22 ENCOUNTER — Other Ambulatory Visit: Payer: Self-pay

## 2021-12-22 ENCOUNTER — Ambulatory Visit: Payer: Self-pay

## 2021-12-22 ENCOUNTER — Encounter: Payer: Self-pay | Admitting: Physical Medicine and Rehabilitation

## 2021-12-22 ENCOUNTER — Ambulatory Visit (INDEPENDENT_AMBULATORY_CARE_PROVIDER_SITE_OTHER): Payer: Medicare Other | Admitting: Physical Medicine and Rehabilitation

## 2021-12-22 VITALS — BP 128/68 | HR 78

## 2021-12-22 DIAGNOSIS — M5416 Radiculopathy, lumbar region: Secondary | ICD-10-CM

## 2021-12-22 MED ORDER — METHYLPREDNISOLONE ACETATE 80 MG/ML IJ SUSP
80.0000 mg | Freq: Once | INTRAMUSCULAR | Status: AC
  Administered 2021-12-22: 80 mg

## 2021-12-22 NOTE — Progress Notes (Signed)
Pt state lower back pain that travels down his left ankle. Pt state sitting, walking and standing makes the pain worse. Pt state he takes pain meds to help ease his pain.  Numeric Pain Rating Scale and Functional Assessment Average Pain 3   In the last MONTH (on 0-10 scale) has pain interfered with the following?  1. General activity like being  able to carry out your everyday physical activities such as walking, climbing stairs, carrying groceries, or moving a chair?  Rating(9)   +Driver, -BT, -Dye Allergies.

## 2021-12-22 NOTE — Patient Instructions (Signed)

## 2022-01-01 NOTE — Progress Notes (Signed)
Jaime [name] - 88 y.o. male MRN [id number]  Date of birth: [birth year]  Office Visit Note: Visit Date: 12/22/2021 PCP: [name], MD Referred by: [name], MD  Subjective: Chief Complaint  Patient presents with   Lower Back - Pain   Left Ankle - Pain   HPI:  Jaime [name] is a 88 y.o. male who comes in today at the request of Barnet Pall, FNP for planned Left L2-3 Lumbar Interlaminar epidural steroid injection with fluoroscopic guidance.  The patient has failed conservative care including home exercise, medications, time and activity modification.  This injection will be diagnostic and hopefully therapeutic.  Please see requesting physician notes for further details and justification.   ROS Otherwise per HPI.  Assessment & Plan: Visit Diagnoses:    ICD-10-CM   1. Lumbar radiculopathy  M54.16 XR C-ARM NO REPORT    Epidural Steroid injection    methylPREDNISolone acetate (DEPO-MEDROL) injection 80 mg      Plan: No additional findings.   Meds & Orders:  Meds ordered this encounter  Medications   methylPREDNISolone acetate (DEPO-MEDROL) injection 80 mg    Orders Placed This Encounter  Procedures   XR C-ARM NO REPORT   Epidural Steroid injection    Follow-up: Return for visit to requesting provider as needed.   Procedures: No procedures performed  Lumbar Epidural Steroid Injection - Interlaminar Approach with Fluoroscopic Guidance  Patient: Jaime [name]      Date of Birth: 19-May-1931 MRN: [id number] PCP: [name], MD      Visit Date: 12/22/2021   Universal Protocol:     Consent Given By: the patient  Position: PRONE  Additional Comments: Vital signs were monitored before and after the procedure. Patient was prepped and draped in the usual sterile fashion. The correct patient, procedure, and site was verified.   Injection Procedure Details:   Procedure diagnoses: Lumbar radiculopathy [M54.16]   Meds Administered:   Meds ordered this encounter  Medications   methylPREDNISolone acetate (DEPO-MEDROL) injection 80 mg     Laterality: Left  Location/Site:  L2-3  Needle: 3.5 in., 20 ga. Tuohy  Needle Placement: Paramedian epidural  Findings:   -Comments: Excellent flow of contrast into the epidural space.  Procedure Details: Using a paramedian approach from the side mentioned above, the region overlying the inferior lamina was localized under fluoroscopic visualization and the soft tissues overlying this structure were infiltrated with 4 ml. of 1% Lidocaine without Epinephrine. The Tuohy needle was inserted into the epidural space using a paramedian approach.   The epidural space was localized using loss of resistance along with counter oblique bi-planar fluoroscopic views.  After negative aspirate for air, blood, and CSF, a 2 ml. volume of Isovue-250 was injected into the epidural space and the flow of contrast was observed. Radiographs were obtained for documentation purposes.    The injectate was administered into the level noted above.   Additional Comments:  The patient tolerated the procedure well Dressing: 2 x 2 sterile gauze and Band-Aid    Post-procedure details: Patient was observed during the procedure. Post-procedure instructions were reviewed.  Patient left the clinic in stable condition.    Clinical History: MRI LUMBAR SPINE WITHOUT CONTRAST   TECHNIQUE: Multiplanar, multisequence MR imaging of the lumbar spine was performed. No intravenous contrast was administered.   COMPARISON:  Plain films Mar 23, 2020   FINDINGS: Segmentation:  Standard.   Alignment: Levoconvex scoliosis of the lumbar spine. Grade 1 anterolisthesis of L2 over L3  and L4 over L5 with minimal retrolisthesis of L5 over S1.   Vertebrae:  No fracture, evidence of discitis, or bone lesion.   Conus medullaris and cauda equina: Conus extends to the L1-2 level. Conus and cauda equina appear normal.    Paraspinal and other soft tissues: Negative.   Disc levels:   T12-L1: Disc bulge with associated osteophytic component, facet degenerative changes and ligamentum flavum redundancy resulting in mild spinal canal stenosis and moderate to severe bilateral neural foraminal narrowing.   L1-2: Disc bulge, facet degenerative changes and ligamentum flavum redundancy resulting in mild spinal canal stenosis and moderate bilateral neural foraminal narrowing.   L2-3: Loss of disc height, disc bulge, facet degenerative changes and prominent redundancy of the ligamentum flavum resulting in moderate spinal canal stenosis and mild bilateral neural foraminal narrowing.   L3-4: Prominent loss of disc height, disc bulge with associated osteophytic component, facet degenerative changes and ligamentum flavum redundancy resulting in moderate spinal canal stenosis, moderate right and mild left neural foraminal narrowing.   L4-5: Prominent loss disc height, endplate ridging, prominent hypertrophic facet degenerative changes and ligamentum flavum redundancy resulting in severe spinal canal stenosis and severe bilateral neural foraminal narrowing.   L5-S1: Disc bulge, facet degenerative changes and ligamentum flavum redundancy resulting in narrowing of the bilateral subarticular zones, left greater than right and moderate bilateral neural foraminal narrowing.   IMPRESSION: 1. Multilevel degenerative changes of the lumbar spine as described above, worst at L4-5 where there is severe spinal canal stenosis and severe bilateral neural foraminal narrowing. 2. Moderate spinal canal stenosis at L2-3 and L3-4. 3. Moderate bilateral neural foraminal narrowing at L5-S1 with narrowing of the bilateral subarticular zones at this level. 4. Moderate to severe bilateral neural foraminal narrowing at T12-L1.     Electronically Signed   By: Pedro Earls M.D.   On: 08/19/2020 11:59      Objective:  VS:  HT:     WT:    BMI:      BP:128/68   HR:78bpm   TEMP: ( )   RESP:  Physical Exam Vitals and nursing note reviewed.  Constitutional:      General: He is not in acute distress.    Appearance: Normal appearance. He is not ill-appearing.  HENT:     Head: Normocephalic and atraumatic.     Right Ear: External ear normal.     Left Ear: External ear normal.     Nose: No congestion.  Eyes:     Extraocular Movements: Extraocular movements intact.  Cardiovascular:     Rate and Rhythm: Normal rate.     Pulses: Normal pulses.  Pulmonary:     Effort: Pulmonary effort is normal. No respiratory distress.  Abdominal:     General: There is no distension.     Palpations: Abdomen is soft.  Musculoskeletal:        General: No tenderness or signs of injury.     Cervical back: Neck supple.     Right lower leg: No edema.     Left lower leg: No edema.     Comments: Patient has good distal strength without clonus.  Skin:    Findings: No erythema or rash.  Neurological:     General: No focal deficit present.     Mental Status: He is alert and oriented to person, place, and time.     Sensory: No sensory deficit.     Motor: No weakness or abnormal muscle tone.  Coordination: Coordination normal.  Psychiatric:        Mood and Affect: Mood normal.        Behavior: Behavior normal.     Imaging: No results found.

## 2022-01-01 NOTE — Procedures (Signed)
Lumbar Epidural Steroid Injection - Interlaminar Approach with Fluoroscopic Guidance  Patient: Jaime [name]      Date of Birth: 09-Oct-1931 MRN: [id number] PCP: Garlan Fillers, MD      Visit Date: 12/22/2021   Universal Protocol:     Consent Given By: the patient  Position: PRONE  Additional Comments: Vital signs were monitored before and after the procedure. Patient was prepped and draped in the usual sterile fashion. The correct patient, procedure, and site was verified.   Injection Procedure Details:   Procedure diagnoses: Lumbar radiculopathy [M54.16]   Meds Administered:  Meds ordered this encounter  Medications   methylPREDNISolone acetate (DEPO-MEDROL) injection 80 mg     Laterality: Left  Location/Site:  L2-3  Needle: 3.5 in., 20 ga. Tuohy  Needle Placement: Paramedian epidural  Findings:   -Comments: Excellent flow of contrast into the epidural space.  Procedure Details: Using a paramedian approach from the side mentioned above, the region overlying the inferior lamina was localized under fluoroscopic visualization and the soft tissues overlying this structure were infiltrated with 4 ml. of 1% Lidocaine without Epinephrine. The Tuohy needle was inserted into the epidural space using a paramedian approach.   The epidural space was localized using loss of resistance along with counter oblique bi-planar fluoroscopic views.  After negative aspirate for air, blood, and CSF, a 2 ml. volume of Isovue-250 was injected into the epidural space and the flow of contrast was observed. Radiographs were obtained for documentation purposes.    The injectate was administered into the level noted above.   Additional Comments:  The patient tolerated the procedure well Dressing: 2 x 2 sterile gauze and Band-Aid    Post-procedure details: Patient was observed during the procedure. Post-procedure instructions were reviewed.  Patient left the clinic in stable  condition.

## 2022-01-03 ENCOUNTER — Telehealth: Payer: Self-pay | Admitting: Physical Medicine and Rehabilitation

## 2022-01-03 NOTE — Telephone Encounter (Signed)
Pt called. States that he needs to speak with the nurse about scheduling an appt.

## 2022-01-09 ENCOUNTER — Telehealth: Payer: Self-pay | Admitting: Physical Medicine and Rehabilitation

## 2022-01-09 NOTE — Telephone Encounter (Signed)
Patient called. Had asked for a call but he only wanted to confirm appointment.  ?

## 2022-01-16 ENCOUNTER — Telehealth: Payer: Self-pay | Admitting: Physical Medicine and Rehabilitation

## 2022-01-16 ENCOUNTER — Other Ambulatory Visit: Payer: Self-pay | Admitting: Physical Medicine and Rehabilitation

## 2022-01-16 MED ORDER — DIAZEPAM 5 MG PO TABS: ORAL_TABLET | ORAL | 0 refills | Status: DC

## 2022-01-16 NOTE — Progress Notes (Signed)
Pre-procedure Valium sent in for spinal cord stimulator trial on 01/20/2022. Patient called and informed he can pick this up from pharmacy this week.  ?

## 2022-01-16 NOTE — Telephone Encounter (Signed)
Pt called requesting a call from [facility] .Pt has questions about insurance approval for his upcoming appt. Please call pt at [phone]. ?

## 2022-01-17 ENCOUNTER — Telehealth: Payer: Self-pay | Admitting: Physical Medicine and Rehabilitation

## 2022-01-17 NOTE — Telephone Encounter (Signed)
Patient called. He would like someone to call him back.  ?

## 2022-01-20 ENCOUNTER — Other Ambulatory Visit: Payer: Self-pay

## 2022-01-20 ENCOUNTER — Encounter: Payer: Self-pay | Admitting: Physical Medicine and Rehabilitation

## 2022-01-20 ENCOUNTER — Ambulatory Visit (INDEPENDENT_AMBULATORY_CARE_PROVIDER_SITE_OTHER): Payer: Medicare Other | Admitting: Physical Medicine and Rehabilitation

## 2022-01-20 ENCOUNTER — Ambulatory Visit: Payer: Self-pay

## 2022-01-20 VITALS — BP 114/74 | HR 81

## 2022-01-20 DIAGNOSIS — M5416 Radiculopathy, lumbar region: Secondary | ICD-10-CM | POA: Diagnosis not present

## 2022-01-20 DIAGNOSIS — M48062 Spinal stenosis, lumbar region with neurogenic claudication: Secondary | ICD-10-CM

## 2022-01-20 DIAGNOSIS — M961 Postlaminectomy syndrome, not elsewhere classified: Secondary | ICD-10-CM

## 2022-01-20 DIAGNOSIS — G894 Chronic pain syndrome: Secondary | ICD-10-CM

## 2022-01-20 MED ORDER — CEPHALEXIN 500 MG PO CAPS: 500.0000 mg | ORAL_CAPSULE | Freq: Two times a day (BID) | ORAL | 0 refills | Status: DC

## 2022-01-20 MED ORDER — LIDOCAINE HCL (PF) 1 % IJ SOLN
2.0000 mL | Freq: Once | INTRAMUSCULAR | Status: AC
  Administered 2022-01-20: 2 mL

## 2022-01-20 NOTE — Progress Notes (Signed)
Pt state lower back pain.  ? ?Numeric Pain Rating Scale and Functional Assessment ?Average Pain 5 ? ? ?In the last MONTH (on 0-10 scale) has pain interfered with the following? ? ?1. General activity like being  able to carry out your everyday physical activities such as walking, climbing stairs, carrying groceries, or moving a chair?  ?Rating(8) ? ? ? ? ?

## 2022-01-21 NOTE — Procedures (Signed)
Spinal Cord Stimulator Trial ? ?Patient: Jaime [name]      ?Date of Birth: [birth year] ?MRN: [id number] ?PCP: Garlan Fillers, MD      ?Visit Date: 01/20/2022 ?  ?Universal Protocol:    ?Date/Time: 03/18/237:33 PM ? ?Consent Given By: the patient ? ?Position: PRONE ? ?Additional Comments: ?Vital signs were monitored before and after the procedure. ?Patient was prepped and draped in the usual sterile fashion. ?The correct patient, procedure, and site was verified. ? ? ?Injection Procedure Details:  ?Procedure Site One ?Meds Administered:  ?Meds ordered this encounter  ?Medications  ? lidocaine (PF) (XYLOCAINE) 1 % injection 2 mL  ? cephALEXin (KEFLEX) 500 MG capsule  ?  Sig: Take 1 capsule (500 mg total) by mouth 2 (two) times daily.  ?  Dispense:  14 capsule  ?  Refill:  0  ?  ? ?Location/Site: Leads were placed just to the left midline with the upper most lead just superior to the top of the T7 vertebral body and then spanning 3 vertebral bodies with the inferior lead at the bottom of the T9 vertebral body.  Saved images show marker placed over the T12 vertebral body. ? ? ?Needle size: 14 gauge  ? ?Needle type: EPIMED RX Coud? Epidural Needle ? ?Needle Placement: T12-L1 Dorsal epidural space, please note numbering scheme shows the T12 vertebral body has rib roots bilaterally. ? ?Findings: ? - ? -Comments: Excellent paresthesia coverage was obtained in all of the areas of the patient's normal pain ? ?Procedure Details: ?After localization of the T12-L1 epidural space and the overlying soft tissue, the needle was introduced two bodies below this level to produce an adequate angle for epidural penetration. The soft tissue was infiltrated with 2-3 mls. of 1% Lidocaine without Epinephrine. Using the posterolateral approach, the #14 gauge Epimed needle was inserted down to the posterior aspect of the L1 lamina and then "walked off" the superior aspect of this lamina into the T12-L1 epidural space. The epidural space  was localized with loss of resistance and negative aspirate for CSF or blood. The [employer] (16) electrode stimulation lead was passed up so that just the superior most lead was at the location noted above while maintaining the lead in the midline.  ? ?The pulse generator system was adjusted and programmed by myself and the company technical representative by varying the stimulator sites, rates, pulse amplitude, and duration. Adequate coverage was obtained as described above.  Subsequent to this, the introducer needle was removed and the spinal cord stimulator trial lead was fastened to the skin using steri-strips and strain reduction loop. The lead wire was then connected and Tegaderm was applied to produce an occlusive dressing. The patient was then brought out of the procedure room and was given a portable stimulator. ? ?The patient will follow-up in three to seven days to determine whether this was efficacious.  Oral prophylactic antibiotic Keflex was prescribed as noted. ? ? ?Additional Comments:  ?The patient tolerated the procedure well ?Dressing: As noted above ?  ? ?Post-procedure details: ?Patient was observed during the procedure. ?Post-procedure instructions were reviewed. ? ?Patient left the clinic in stable condition. ? ?  ?

## 2022-01-21 NOTE — Procedures (Signed)
Advanced programming analysis performed with the [employer] technical representative with physician input and  monitoring. [employer] FAST and contour programming with  combined therapy initiated. 

## 2022-01-21 NOTE — Progress Notes (Signed)
Jaime [name] - 88 y.o. male MRN [id number]  Date of birth: 03/18/1931  Office Visit Note: Visit Date: 01/20/2022 PCP: Garlan Fillers, MD Referred by: Garlan Fillers, MD  Subjective: Chief Complaint  Patient presents with   Lower Back - Pain   Left Leg - Pain, Numbness   Right Hip - Pain, Numbness   HPI:  Jaime [name] is a 88 y.o. male who comes in today for planned spinal cord stimulator trial.  The patient has failed conservative care including PT/home exercise, medications, time and activity modification.  The patient has chronic, severe and recalcitrant low back pain with neurogenic pain and has failed all other interventional spine procedures.  This represents a procedure of last resort for recalcitrant pain. Pre-procedure psychological evaluation has been completed and procedure pre-authorization has been obtained.  Please see requesting physician  and our notes for further details and justification.   Referring: Ellin Goodie, FNP, Ivery Quale, MD   ROS Otherwise per HPI.  Assessment & Plan: Visit Diagnoses:    ICD-10-CM   1. Lumbar radiculopathy  M54.16 XR C-ARM NO REPORT    Spinal Cord Stimulator - Analysis    Spinal Cord Stimulator - Placement    lidocaine (PF) (XYLOCAINE) 1 % injection 2 mL    2. Post laminectomy syndrome  M96.1 XR C-ARM NO REPORT    Spinal Cord Stimulator - Analysis    Spinal Cord Stimulator - Placement    lidocaine (PF) (XYLOCAINE) 1 % injection 2 mL    3. Chronic pain syndrome  G89.4 XR C-ARM NO REPORT    Spinal Cord Stimulator - Analysis    Spinal Cord Stimulator - Placement    lidocaine (PF) (XYLOCAINE) 1 % injection 2 mL    4. Spinal stenosis of lumbar region with neurogenic claudication  M48.062 XR C-ARM NO REPORT    Spinal Cord Stimulator - Analysis    Spinal Cord Stimulator - Placement    lidocaine (PF) (XYLOCAINE) 1 % injection 2 mL      Plan: No additional findings.   Meds & Orders:  Meds ordered this  encounter  Medications   lidocaine (PF) (XYLOCAINE) 1 % injection 2 mL   cephALEXin (KEFLEX) 500 MG capsule    Sig: Take 1 capsule (500 mg total) by mouth 2 (two) times daily.    Dispense:  14 capsule    Refill:  0    Orders Placed This Encounter  Procedures   Spinal Cord Stimulator - Analysis   Spinal Cord Stimulator - Placement   XR C-ARM NO REPORT    Follow-up: Return in about 1 week (around 01/27/2022).   Procedures: No procedures performed  Spinal Cord Stimulator Trial  Patient: Jaime [name]      Date of Birth: 1930-12-02 MRN: [id number] PCP: Garlan Fillers, MD      Visit Date: 01/20/2022   Universal Protocol:    Date/Time: 03/18/237:33 PM  Consent Given By: the patient  Position: PRONE  Additional Comments: Vital signs were monitored before and after the procedure. Patient was prepped and draped in the usual sterile fashion. The correct patient, procedure, and site was verified.   Injection Procedure Details:  Procedure Site One Meds Administered:  Meds ordered this encounter  Medications   lidocaine (PF) (XYLOCAINE) 1 % injection 2 mL   cephALEXin (KEFLEX) 500 MG capsule    Sig: Take 1 capsule (500 mg total) by mouth 2 (two) times daily.    Dispense:  14 capsule  Refill:  0     Location/Site: Leads were placed just to the left midline with the upper most lead just superior to the top of the T7 vertebral body and then spanning 3 vertebral bodies with the inferior lead at the bottom of the T9 vertebral body.  Saved images show marker placed over the T12 vertebral body.   Needle size: 14 gauge   Needle type: EPIMED RX Coud Epidural Needle  Needle Placement: T12-L1 Dorsal epidural space, please note numbering scheme shows the T12 vertebral body has rib roots bilaterally.  Findings:  -  -Comments: Excellent paresthesia coverage was obtained in all of the areas of the patient's normal pain  Procedure Details: After localization of the T12-L1  epidural space and the overlying soft tissue, the needle was introduced two bodies below this level to produce an adequate angle for epidural penetration. The soft tissue was infiltrated with 2-3 mls. of 1% Lidocaine without Epinephrine. Using the posterolateral approach, the #14 gauge Epimed needle was inserted down to the posterior aspect of the L1 lamina and then "walked off" the superior aspect of this lamina into the T12-L1 epidural space. The epidural space was localized with loss of resistance and negative aspirate for CSF or blood. The [employer] (16) electrode stimulation lead was passed up so that just the superior most lead was at the location noted above while maintaining the lead in the midline.   The pulse generator system was adjusted and programmed by myself and the company technical representative by varying the stimulator sites, rates, pulse amplitude, and duration. Adequate coverage was obtained as described above.  Subsequent to this, the introducer needle was removed and the spinal cord stimulator trial lead was fastened to the skin using steri-strips and strain reduction loop. The lead wire was then connected and Tegaderm was applied to produce an occlusive dressing. The patient was then brought out of the procedure room and was given a portable stimulator.  The patient will follow-up in three to seven days to determine whether this was efficacious.  Oral prophylactic antibiotic Keflex was prescribed as noted.   Additional Comments:  The patient tolerated the procedure well Dressing: As noted above    Post-procedure details: Patient was observed during the procedure. Post-procedure instructions were reviewed.  Patient left the clinic in stable condition.     Advanced programming analysis performed with the [facility] with physician input and  monitoring.  [employer] FAST and [employer] information officer with  combined therapy  initiated.    Clinical History: MRI LUMBAR SPINE WITHOUT CONTRAST     TECHNIQUE:  Multiplanar, multisequence MR imaging of the lumbar spine was  performed. No intravenous contrast was administered.     COMPARISON:  Plain films Mar 23, 2020     FINDINGS:  Segmentation:  Standard.     Alignment: Levoconvex scoliosis of the lumbar spine. Grade 1  anterolisthesis of L2 over L3 and L4 over L5 with minimal  retrolisthesis of L5 over S1.     Vertebrae:  No fracture, evidence of discitis, or bone lesion.     Conus medullaris and cauda equina: Conus extends to the L1-2 level.  Conus and cauda equina appear normal.     Paraspinal and other soft tissues: Negative.     Disc levels:     T12-L1: Disc bulge with associated osteophytic component, facet  degenerative changes and ligamentum flavum redundancy resulting in  mild spinal canal stenosis and moderate to severe bilateral neural  foraminal narrowing.     L1-2: Disc bulge, facet degenerative changes and ligamentum flavum  redundancy resulting in mild spinal canal stenosis and moderate  bilateral neural foraminal narrowing.     L2-3: Loss of disc height, disc bulge, facet degenerative changes  and prominent redundancy of the ligamentum flavum resulting in  moderate spinal canal stenosis and mild bilateral neural foraminal  narrowing.     L3-4: Prominent loss of disc height, disc bulge with associated  osteophytic component, facet degenerative changes and ligamentum  flavum redundancy resulting in moderate spinal canal stenosis,  moderate right and mild left neural foraminal narrowing.     L4-5: Prominent loss disc height, endplate ridging, prominent  hypertrophic facet degenerative changes and ligamentum flavum  redundancy resulting in severe spinal canal stenosis and severe  bilateral neural foraminal narrowing.     L5-S1: Disc bulge, facet degenerative changes and ligamentum flavum  redundancy resulting in narrowing of the  bilateral subarticular  zones, left greater than right and moderate bilateral neural  foraminal narrowing.     IMPRESSION:  1. Multilevel degenerative changes of the lumbar spine as described  above, worst at L4-5 where there is severe spinal canal stenosis and  severe bilateral neural foraminal narrowing.  2. Moderate spinal canal stenosis at L2-3 and L3-4.  3. Moderate bilateral neural foraminal narrowing at L5-S1 with  narrowing of the bilateral subarticular zones at this level.  4. Moderate to severe bilateral neural foraminal narrowing at  T12-L1.        Electronically Signed    By: Baldemar Lenis M.D.    On: 08/19/2020 11:59     Objective:  VS:  HT:    WT:   BMI:     BP:114/74  HR:81bpm  TEMP: ( )  RESP:  Physical Exam Vitals and nursing note reviewed.  Constitutional:      General: He is not in acute distress.    Appearance: Normal appearance. He is not ill-appearing.  HENT:     Head: Normocephalic and atraumatic.     Right Ear: External ear normal.     Left Ear: External ear normal.     Nose: No congestion.  Eyes:     Extraocular Movements: Extraocular movements intact.  Cardiovascular:     Rate and Rhythm: Normal rate.     Pulses: Normal pulses.  Pulmonary:     Effort: Pulmonary effort is normal. No respiratory distress.  Abdominal:     General: There is no distension.     Palpations: Abdomen is soft.  Musculoskeletal:        General: No tenderness or signs of injury.     Cervical back: Neck supple.     Right lower leg: No edema.     Left lower leg: No edema.     Comments: Patient has good distal strength without clonus.  Skin:    Findings: No erythema or rash.  Neurological:     General: No focal deficit present.     Mental Status: He is alert and oriented to person, place, and time.     Sensory: No sensory deficit.     Motor: No weakness or abnormal muscle tone.     Coordination: Coordination normal.  Psychiatric:        Mood  and Affect: Mood normal.        Behavior: Behavior normal.     Imaging: No results found.

## 2022-01-23 ENCOUNTER — Telehealth: Payer: Self-pay

## 2022-01-23 NOTE — Telephone Encounter (Signed)
Patient called regarding some questions about his back and his electrical stimulator that he is using.  ? ?Please advise  ?

## 2022-01-27 ENCOUNTER — Encounter: Payer: Self-pay | Admitting: Physical Medicine and Rehabilitation

## 2022-01-27 ENCOUNTER — Ambulatory Visit (INDEPENDENT_AMBULATORY_CARE_PROVIDER_SITE_OTHER): Payer: Medicare Other | Admitting: Physical Medicine and Rehabilitation

## 2022-01-27 ENCOUNTER — Other Ambulatory Visit: Payer: Self-pay

## 2022-01-27 DIAGNOSIS — M48062 Spinal stenosis, lumbar region with neurogenic claudication: Secondary | ICD-10-CM

## 2022-01-27 DIAGNOSIS — M5416 Radiculopathy, lumbar region: Secondary | ICD-10-CM

## 2022-01-27 DIAGNOSIS — G894 Chronic pain syndrome: Secondary | ICD-10-CM

## 2022-01-27 DIAGNOSIS — M961 Postlaminectomy syndrome, not elsewhere classified: Secondary | ICD-10-CM

## 2022-01-27 MED ORDER — CELECOXIB 200 MG PO CAPS: 200.0000 mg | ORAL_CAPSULE | Freq: Every day | ORAL | 1 refills | Status: AC

## 2022-01-27 NOTE — Progress Notes (Signed)
Pt here today for SCS trial removal. ?

## 2022-01-27 NOTE — Progress Notes (Signed)
? ?[name] - 88 y.o. male MRN [id number]  Date of birth: [birth year] ? ?Office Visit Note: ?Visit Date: 01/27/2022 ?PCP: Jaime Lopes, MD ?Referred by: Jaime Lopes, MD ? ?Subjective: ?Chief Complaint  ?Patient presents with  ? Lower Back - Pain  ? ?HPI:  Jaime [name] is a 88 y.o. male who comes in today for spinal cord stimulator lead pull and management of chronic worsening severe pain which has been recalcitrant to previous treatment.  He reports greater than 80% pain relief with the stimulator trial and is very pleased with the amount of relief and functional increase he has obtained.  He is able to walk and do more with the stimulator in place.  He does want to move forward with implant.  I do want to order MRI of the thoracic spine in preparation for potential for paddle lead placement by Dr. Sherley [name] at [facility].  I will make a referral to Dr. Ronnald [name].  I do think the benefit of having a paddle lead placement due to the patient's activity level even at his age.  Obviously he will follow Dr. Adah [name] recommendation.   ? ?I am also going to put an order for short course of Celebrex.  The patient has been taking Celebrex on his own lately off-and-on as this is helped quite a bit.  He does want a prescription in the interim until he can have the spinal cord stimulator placed. ? ?Quick procedure note: Patient was placed prone on the exam table. The external stimulator/generator was unfastened from the leads. The outer Tegaderm was removed from the bandage carefully. There was no noted erythema of the skin or swelling or induration or drainage. There had been some bloody discharge in the gauze pad. Both trial leads were pulled without difficulty and without discomfort. There was no drainage from the insertion site. Band-Aid was applied.  ? ?ROS Otherwise per HPI. ? ?Assessment & Plan: ?Visit Diagnoses:  ?  ICD-10-CM   ?1. Lumbar radiculopathy  M54.16  Ambulatory referral to Neurosurgery  ?  MR THORACIC SPINE WO CONTRAST  ?  ?2. Post laminectomy syndrome  M96.1 Ambulatory referral to Neurosurgery  ?  MR THORACIC SPINE WO CONTRAST  ?  ?3. Chronic pain syndrome  G89.4 Ambulatory referral to Neurosurgery  ?  MR THORACIC SPINE WO CONTRAST  ?  ?4. Spinal stenosis of lumbar region with neurogenic claudication  M48.062 Ambulatory referral to Neurosurgery  ?  MR THORACIC SPINE WO CONTRAST  ?  ?  ?Plan: No additional findings.  ? ?Meds & Orders:  ?Meds ordered this encounter  ?Medications  ? celecoxib (CELEBREX) 200 MG capsule  ?  Sig: Take 1 capsule (200 mg total) by mouth daily.  ?  Dispense:  30 capsule  ?  Refill:  1  ?  ?Orders Placed This Encounter  ?Procedures  ? MR THORACIC SPINE WO CONTRAST  ? Ambulatory referral to Neurosurgery  ?  ?Follow-up: Return if symptoms worsen or fail to improve.  ? ?Procedures: ?No procedures performed  ?   ? ?Clinical History: ?MRI LUMBAR SPINE WITHOUT CONTRAST  ?   ?TECHNIQUE:  ?Multiplanar, multisequence MR imaging of the lumbar spine was  ?performed. No intravenous contrast was administered.  ?   ?COMPARISON:  Plain films Mar 23, 2020  ?   ?FINDINGS:  ?Segmentation:  Standard.  ?   ?Alignment: Levoconvex scoliosis of the lumbar spine. Grade 1  ?anterolisthesis of L2 over L3 and L4 over  L5 with minimal  ?retrolisthesis of L5 over S1.  ?   ?Vertebrae:  No fracture, evidence of discitis, or bone lesion.  ?   ?Conus medullaris and cauda equina: Conus extends to the L1-2 level.  ?Conus and cauda equina appear normal.  ?   ?Paraspinal and other soft tissues: Negative.  ?   ?Disc levels:  ?   ?T12-L1: Disc bulge with associated osteophytic component, facet  ?degenerative changes and ligamentum flavum redundancy resulting in  ?mild spinal canal stenosis and moderate to severe bilateral neural  ?foraminal narrowing.  ?   ?L1-2: Disc bulge, facet degenerative changes and ligamentum flavum  ?redundancy resulting in mild spinal canal stenosis  and moderate  ?bilateral neural foraminal narrowing.  ?   ?L2-3: Loss of disc height, disc bulge, facet degenerative changes  ?and prominent redundancy of the ligamentum flavum resulting in  ?moderate spinal canal stenosis and mild bilateral neural foraminal  ?narrowing.  ?   ?L3-4: Prominent loss of disc height, disc bulge with associated  ?osteophytic component, facet degenerative changes and ligamentum  ?flavum redundancy resulting in moderate spinal canal stenosis,  ?moderate right and mild left neural foraminal narrowing.  ?   ?L4-5: Prominent loss disc height, endplate ridging, prominent  ?hypertrophic facet degenerative changes and ligamentum flavum  ?redundancy resulting in severe spinal canal stenosis and severe  ?bilateral neural foraminal narrowing.  ?   ?L5-S1: Disc bulge, facet degenerative changes and ligamentum flavum  ?redundancy resulting in narrowing of the bilateral subarticular  ?zones, left greater than right and moderate bilateral neural  ?foraminal narrowing.  ?   ?IMPRESSION:  ?1. Multilevel degenerative changes of the lumbar spine as described  ?above, worst at L4-5 where there is severe spinal canal stenosis and  ?severe bilateral neural foraminal narrowing.  ?2. Moderate spinal canal stenosis at L2-3 and L3-4.  ?3. Moderate bilateral neural foraminal narrowing at L5-S1 with  ?narrowing of the bilateral subarticular zones at this level.  ?4. Moderate to severe bilateral neural foraminal narrowing at  ?T12-L1.  ?   ?   ?Electronically Signed  ?  By: Jaime [name] M.D.  ?  On: 08/19/2020 11:59  ? ? ? ?Objective:  VS:  HT:    WT:   BMI:     BP:   HR: bpm  TEMP: ( )  RESP:  ?Physical Exam ?Skin: ?   Comments: Examination of the skin around the injection site shows no induration or redness or drainage.  Skin around the area where the Tegaderm was placed showed some minor redness but no rashes or tears etc.  ?  ? ?Imaging: ?No results found. ?

## 2022-01-30 ENCOUNTER — Telehealth: Payer: Self-pay | Admitting: Physical Medicine and Rehabilitation

## 2022-01-30 NOTE — Telephone Encounter (Signed)
Pt called requesting a call from [facility]. Pt states he has a question about an referral. Phone number is [phone] ?

## 2022-02-04 ENCOUNTER — Ambulatory Visit: Admission: RE | Admit: 2022-02-04 | Payer: Medicare Other | Source: Ambulatory Visit

## 2022-02-04 ENCOUNTER — Other Ambulatory Visit: Payer: Self-pay | Admitting: Physical Medicine and Rehabilitation

## 2022-02-04 DIAGNOSIS — Z9689 Presence of other specified functional implants: Secondary | ICD-10-CM

## 2022-02-06 ENCOUNTER — Ambulatory Visit
Admission: RE | Admit: 2022-02-06 | Discharge: 2022-02-06 | Disposition: A | Discharge status: Home / Self Care | Payer: Medicare Other | Source: Ambulatory Visit | Attending: Physical Medicine and Rehabilitation | Admitting: Physical Medicine and Rehabilitation

## 2022-02-06 ENCOUNTER — Telehealth: Payer: Self-pay | Admitting: Physical Medicine and Rehabilitation

## 2022-02-06 ENCOUNTER — Other Ambulatory Visit: Payer: Self-pay | Admitting: Physical Medicine and Rehabilitation

## 2022-02-06 ENCOUNTER — Ambulatory Visit
Admission: RE | Admit: 2022-02-06 | Discharge: 2022-02-06 | Disposition: A | Discharge status: Home / Self Care | Payer: Medicare Other | Source: Ambulatory Visit | Attending: Radiology | Admitting: Radiology

## 2022-02-06 DIAGNOSIS — M48062 Spinal stenosis, lumbar region with neurogenic claudication: Secondary | ICD-10-CM

## 2022-02-06 DIAGNOSIS — M961 Postlaminectomy syndrome, not elsewhere classified: Secondary | ICD-10-CM

## 2022-02-06 DIAGNOSIS — G894 Chronic pain syndrome: Secondary | ICD-10-CM

## 2022-02-06 DIAGNOSIS — M5416 Radiculopathy, lumbar region: Secondary | ICD-10-CM

## 2022-02-06 DIAGNOSIS — Z9689 Presence of other specified functional implants: Secondary | ICD-10-CM

## 2022-02-06 IMAGING — MR MR THORACIC SPINE W/O CM
4 of 6 series · 19 of 48 positions shown · non-contrast
Comparison: Thoracic spine radiographs [DATE]

CLINICAL DATA: Bone mass or bone pain, thoracic spine, benign
features on xray. Spinal Cord stimulator implant planning. Mid back
pain.

EXAM:
MRI THORACIC SPINE WITHOUT CONTRAST
TECHNIQUE: Multiplanar, multisequence MR imaging of the thoracic spine was
performed. No intravenous contrast was administered.

[Series 17: T1 · sagittal · 3.0mm · 0.83mm/px · 3 of 17 slices shown]
[im 1/17]
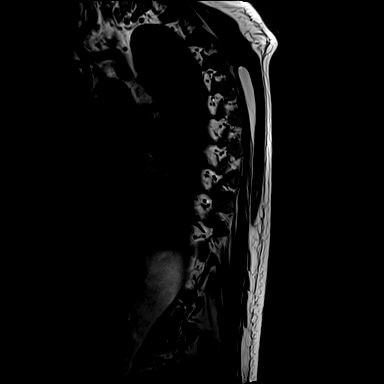
[im 11/17]
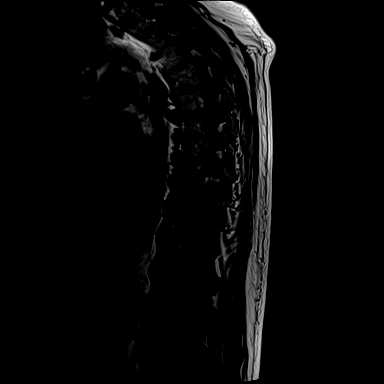
[im 17/17]
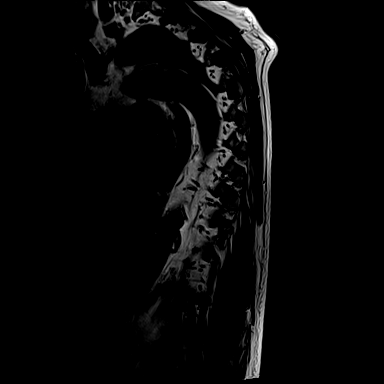

[Series 18: STIR · sagittal · 3.0mm · 1.00mm/px · 3 of 17 slices shown]
[im 1/17]
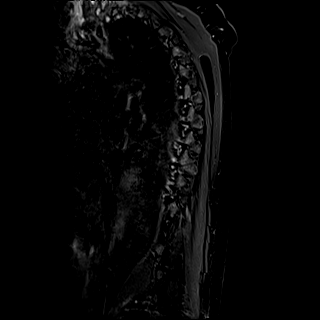
[im 11/17]
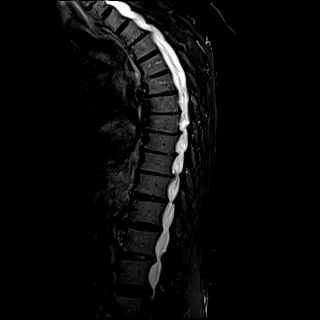
[im 17/17]
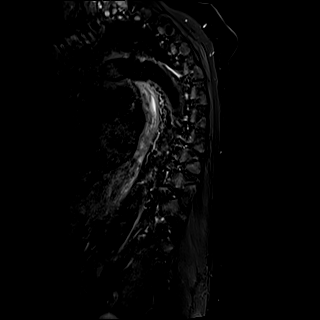

[Series 19: T2 · sagittal · 3.0mm · 0.83mm/px · 4 of 17 slices shown (1 of 2)]
[im 1/17]
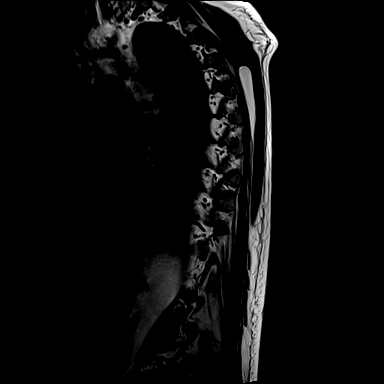
[im 6/17]
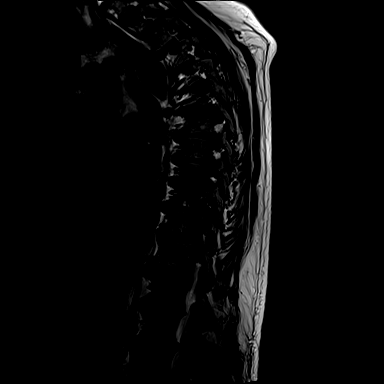
[im 11/17]
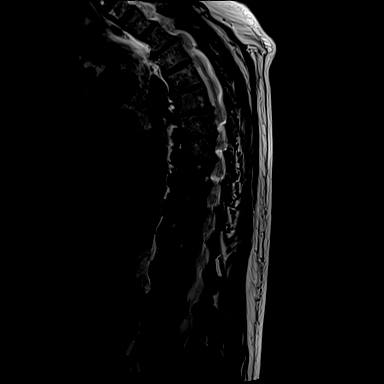
[im 17/17]
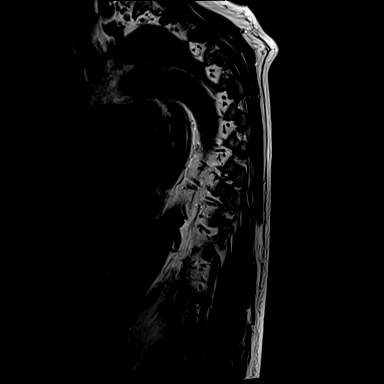

[Series 100: T2 · axial · 4.0mm · 0.28mm/px · z∈[-199,-7]mm · 9 of 68 slices shown (2 of 2)]
[im 5/68]
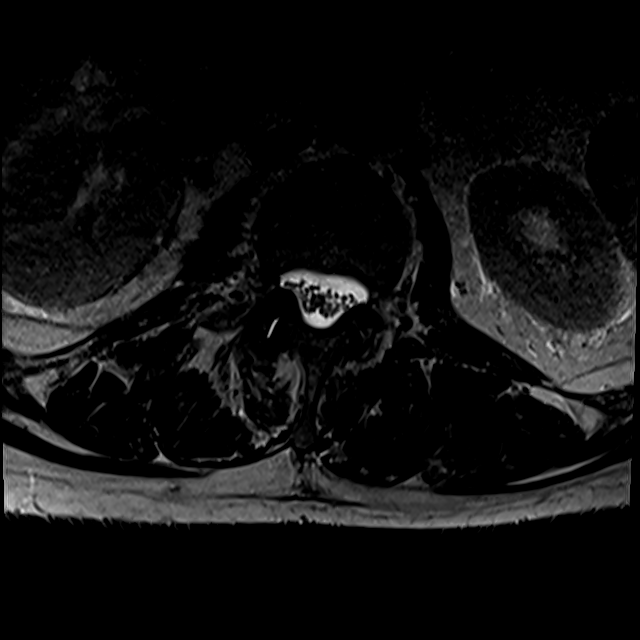
[im 9/68]
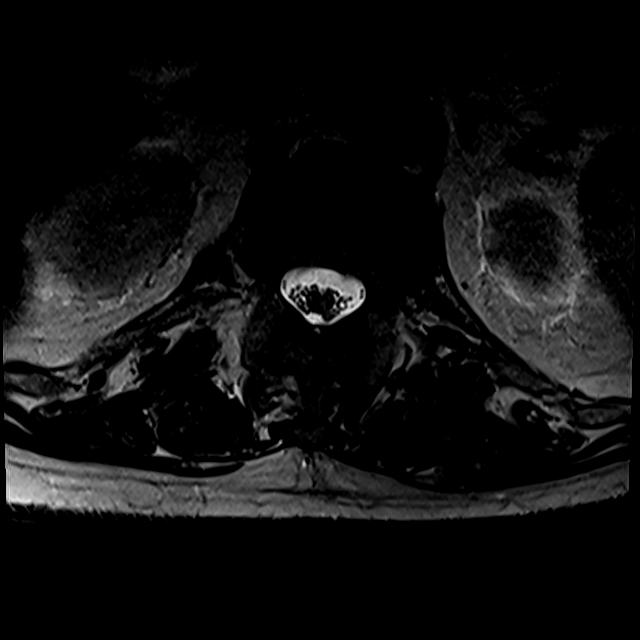
[im 13/68]
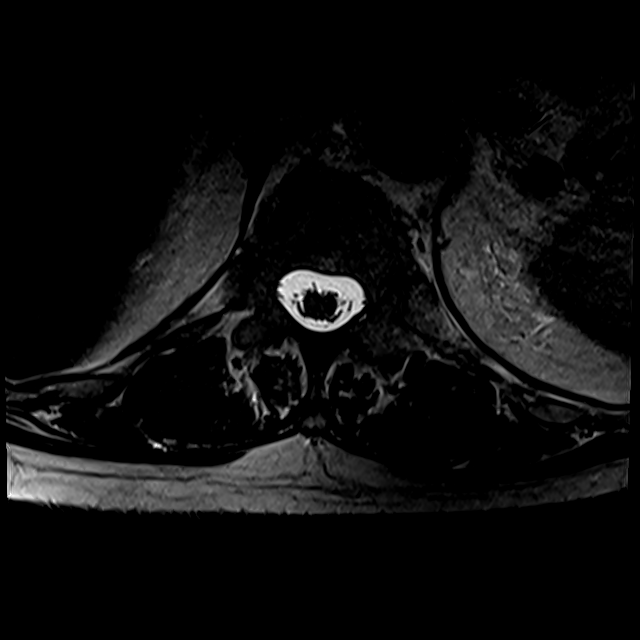
[im 21/68]
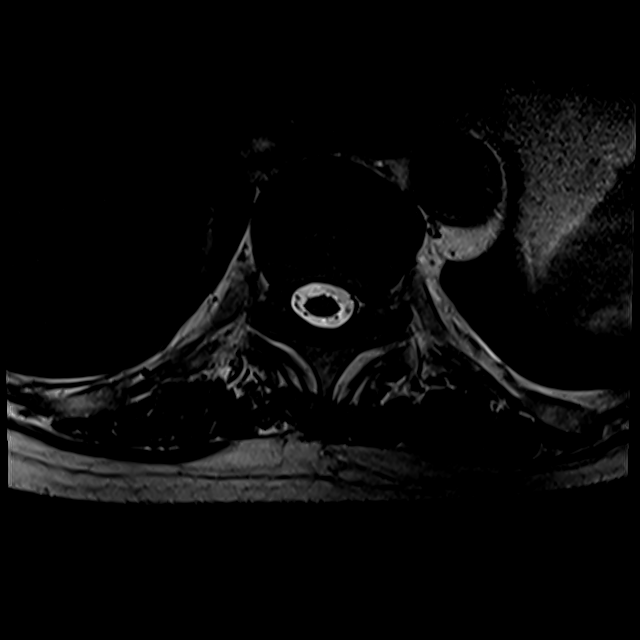
[im 30/68]
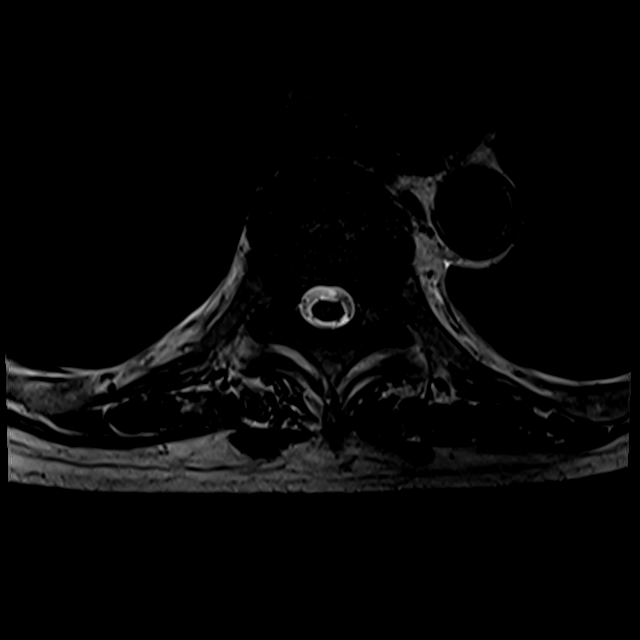
[im 34/68]
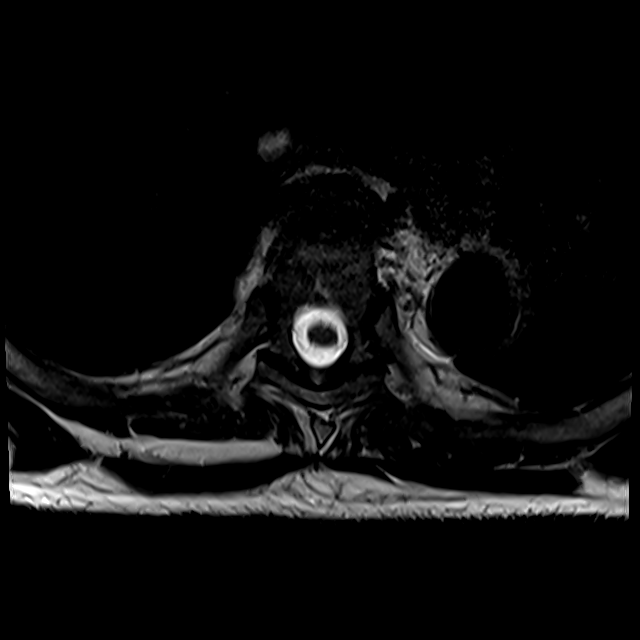
[im 38/68]
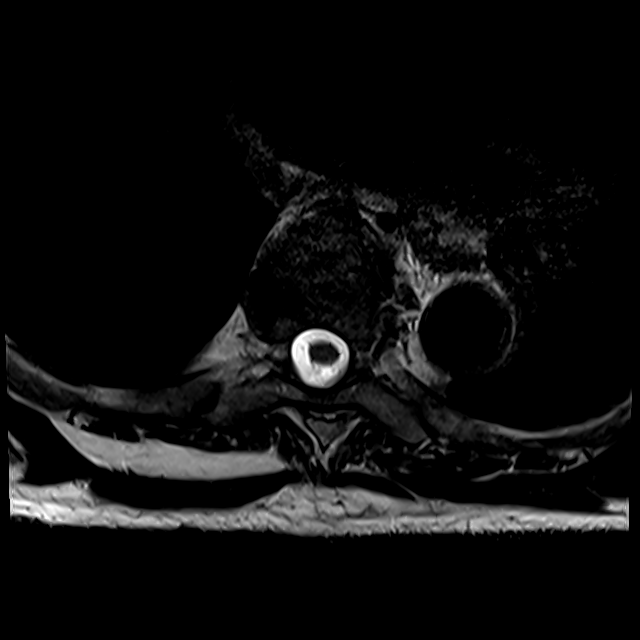
[im 47/68]
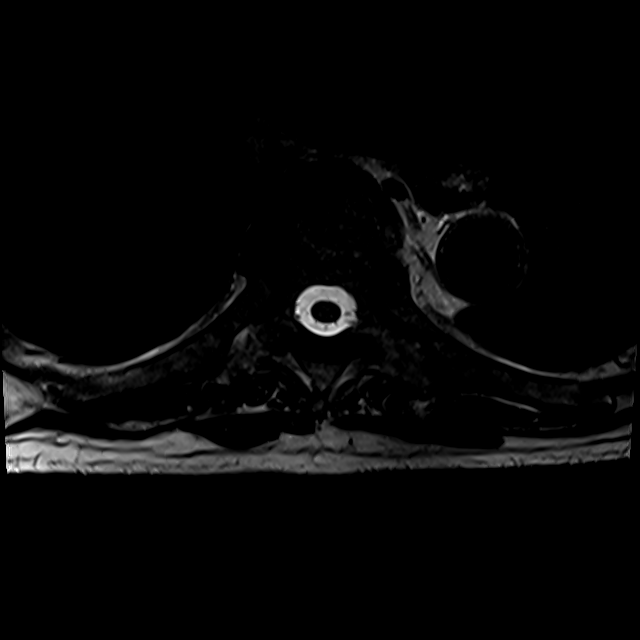
[im 59/68]
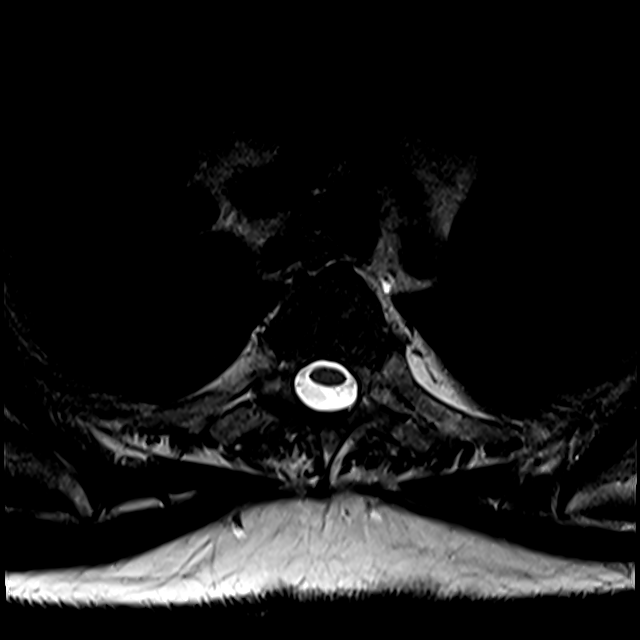

[19 of 48 positions shown; findings below may reference images not displayed]

FINDINGS: Alignment: Trace anterolisthesis of C7 on T1, T1 on T2, and T2 on
T3. increased upper thoracic kyphosis.

Vertebrae: Mild nonspecific diffuse bone marrow heterogeneity. No
suspicious focal marrow lesion, fracture, or significant marrow
edema.

Cord:  Normal signal.

Paraspinal and other soft tissues: 1.0 x 6.3 x 6.5 cm (AP x
transverse x craniocaudal) lipoma deep to the latissimus dorsi
extending from T4-T7 on the right.

Disc levels:

Disc degeneration throughout the thoracic spine including severe
disc space narrowing from T6-7 to T9-10. Widespread, moderate
thoracic facet arthrosis. Bilateral facet ankylosis at T4-5.

Disc bulging and posterior element hypertrophy result in borderline
to mild spinal stenosis at T7-8 and T8-9 and moderate spinal
stenosis at T9-10 without spinal cord compression. There is a small
right paracentral disc protrusion at T5-6 without stenosis.

Moderate bilateral neural foraminal stenosis at T9-10. Up to mild
neural foraminal stenosis elsewhere in the thoracic spine. Mild
spinal stenosis at L1-2 due to disc bulging and prominent facet and
ligamentum flavum hypertrophy.
IMPRESSION: Diffuse thoracic disc and facet degeneration, greatest at T9-10
where there is moderate spinal and neural foraminal stenosis.

## 2022-02-06 IMAGING — CR DG THORACIC SPINE 3V
3 series · 3 of 3 positions shown · non-contrast
Comparison: No direct comparison study available. Intraoperative
images of the thoracolumbar spine [DATE]

CLINICAL DATA: Evaluate for retained stimulator lead

EXAM:
THORACIC SPINE - 3 VIEWS

[w thoracic spine ap]
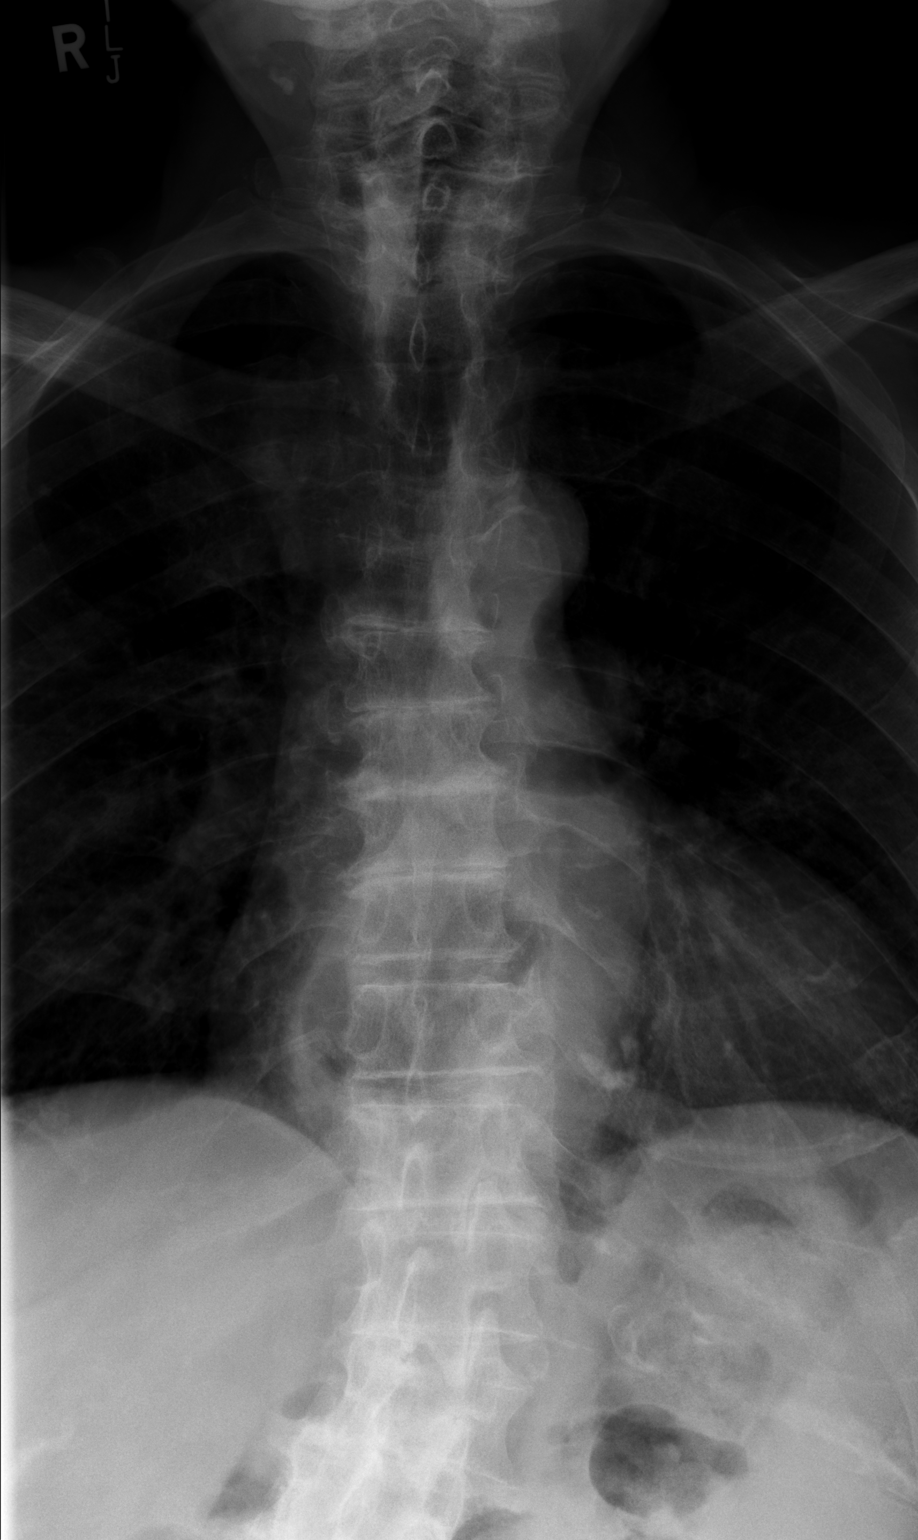

[w thoracic spine lat]
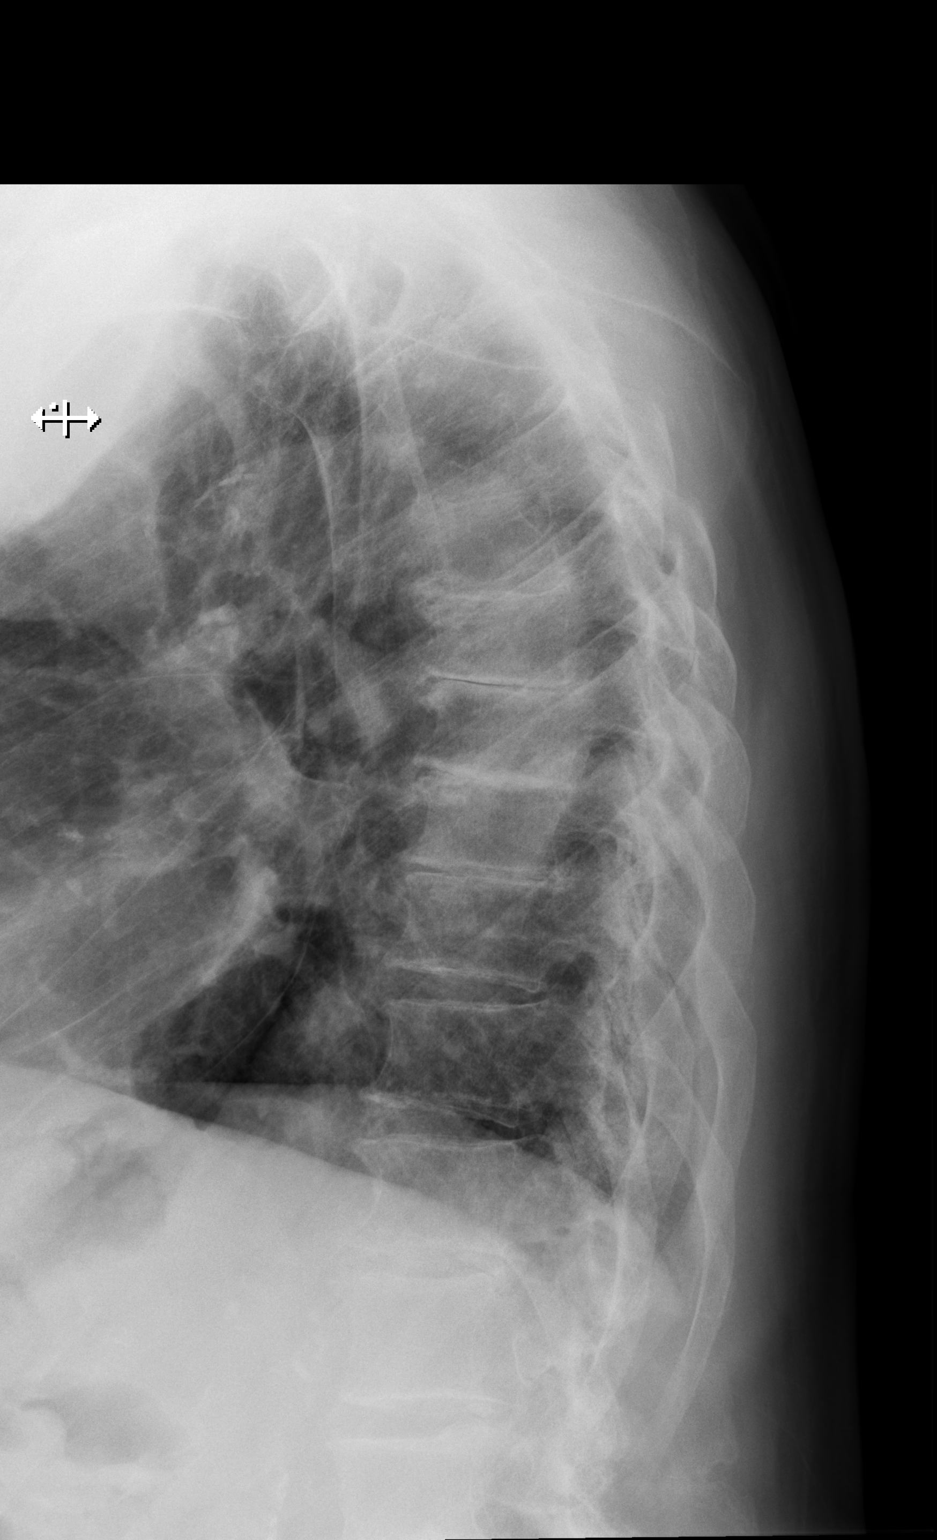

[w thoracic swimmers]
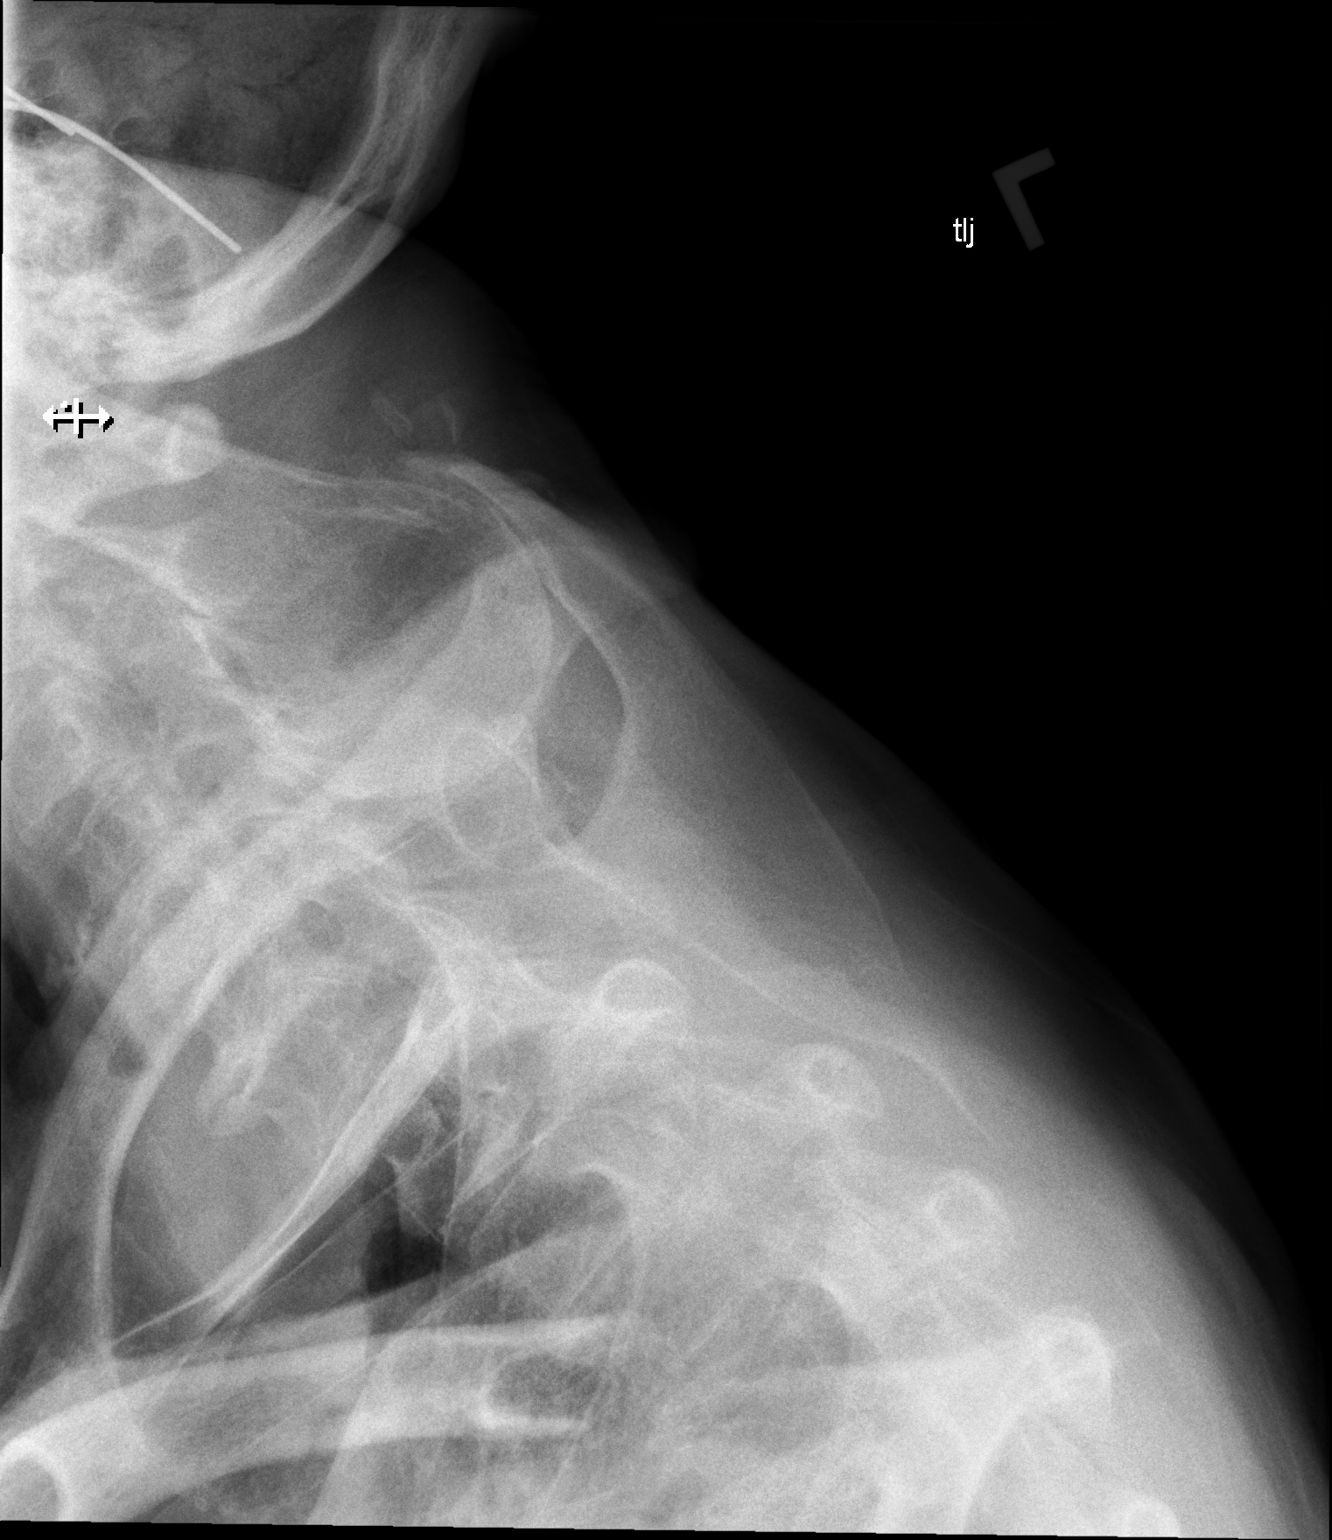

[3 of 3 positions shown; findings below may reference images not displayed]

FINDINGS: Vertebral body heights are preserved. Alignment is normal. There is
multilevel degenerative endplate change with disc space narrowing
most advanced in the midthoracic spine. No retained radiopaque
foreign body is identified.

The imaged heart and lungs are unremarkable. Retrocardiac opacity
likely reflects a moderate-to-large hiatal hernia.
IMPRESSION: 1. No retained radiopaque foreign body.
2. Multilevel degenerative changes of the thoracic spine.
3. Probable moderate-to-large hiatal hernia.

## 2022-02-06 IMAGING — CR DG PELVIS 1-2V
1 series · 1 of 1 positions shown · non-contrast
Comparison: None.

CLINICAL DATA: Bone pain.

EXAM:
PELVIS - 1-2 VIEW

[w pelvis upright]
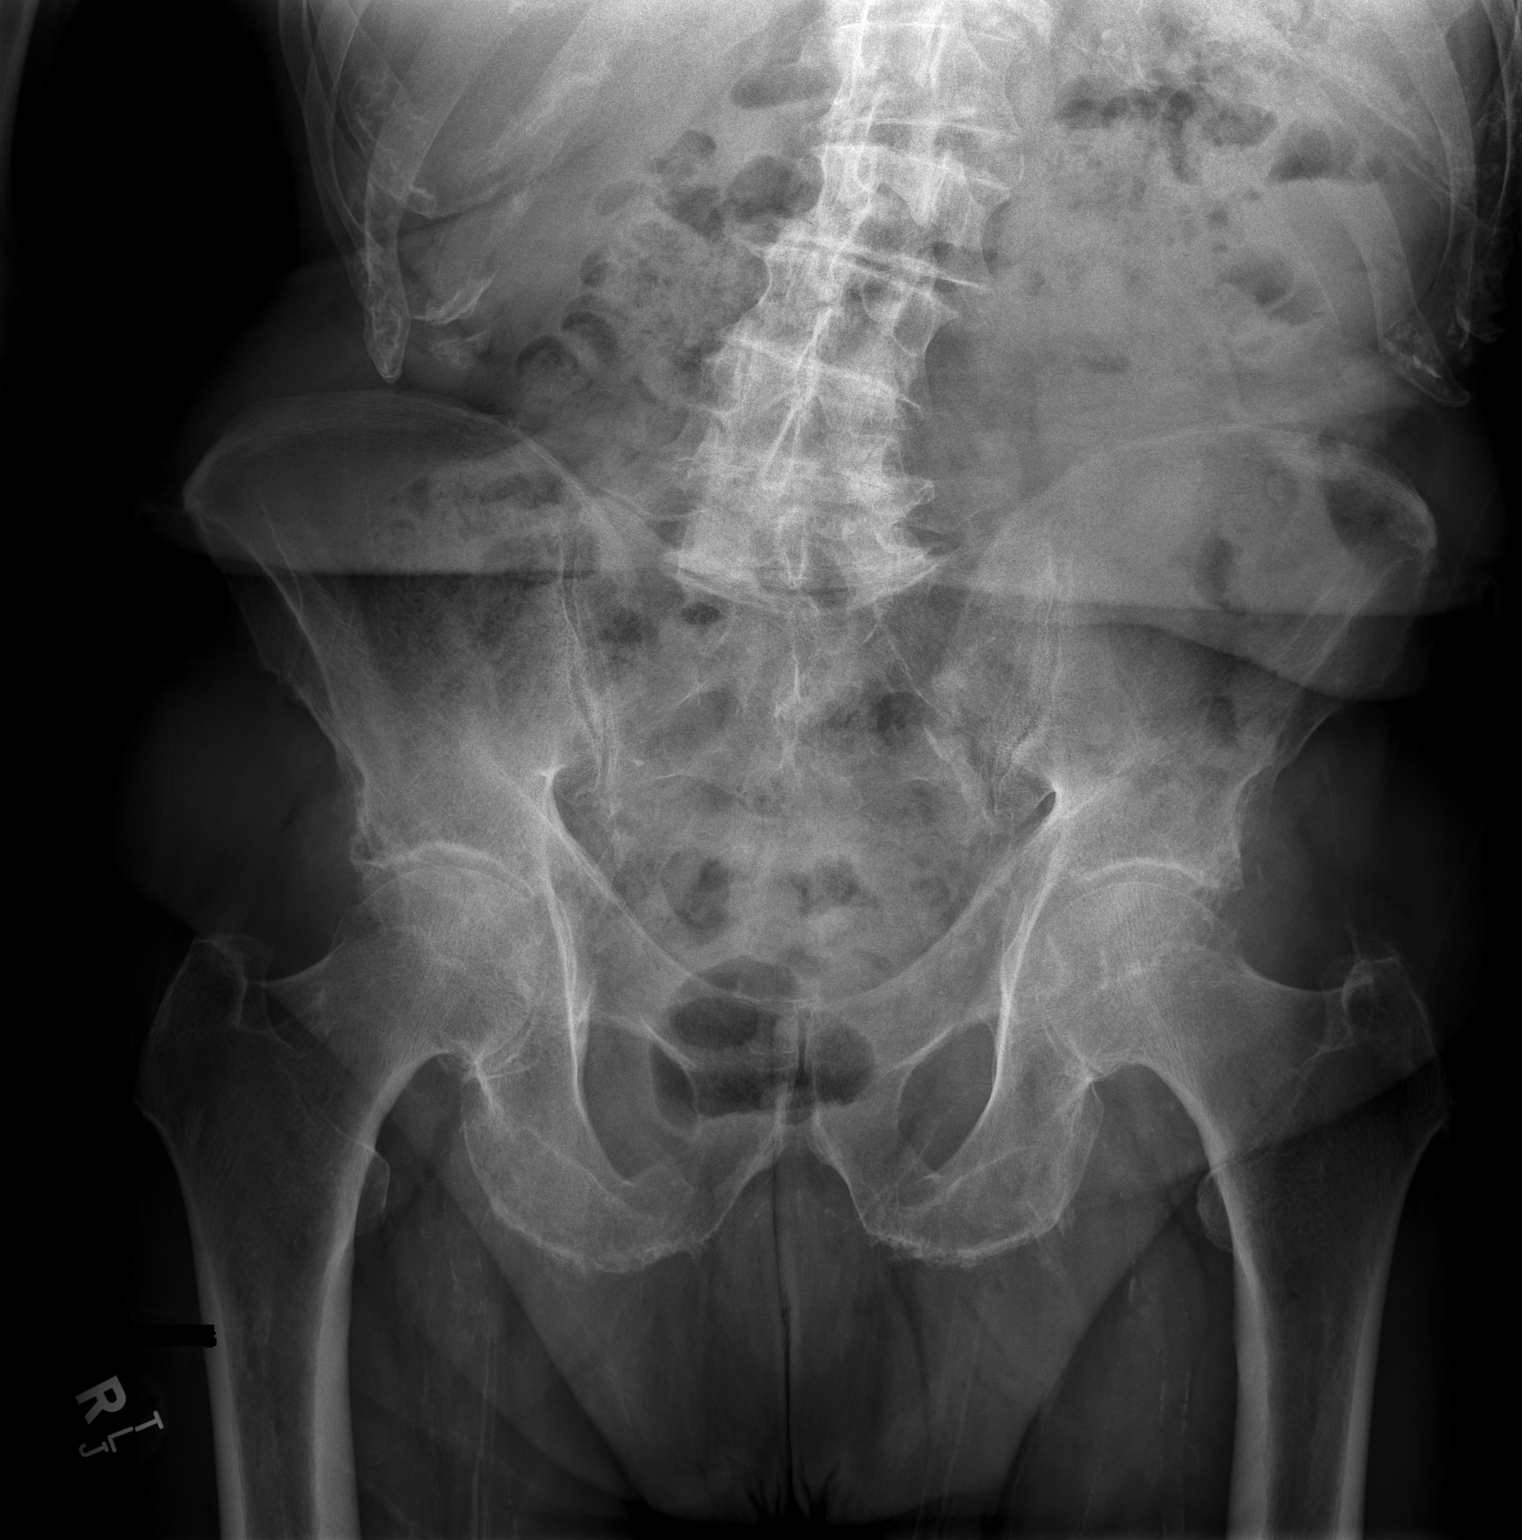

[1 of 1 positions shown; findings below may reference images not displayed]

FINDINGS: There is no evidence of pelvic fracture or diastasis. No pelvic bone
lesions are seen. Multilevel degenerative changes are noted in the
lumbar spine. Mild-to-moderate degenerative changes are seen
involving both hips.
IMPRESSION: No acute abnormality is noted. Degenerative changes as described
above.

## 2022-02-06 IMAGING — CR DG LUMBAR SPINE 2-3V
3 series · 3 of 3 positions shown · non-contrast
Comparison: [DATE].

CLINICAL DATA: Thoracic spine pain.

EXAM:
LUMBAR SPINE - 2-3 VIEW

[w lumbar spine ap]
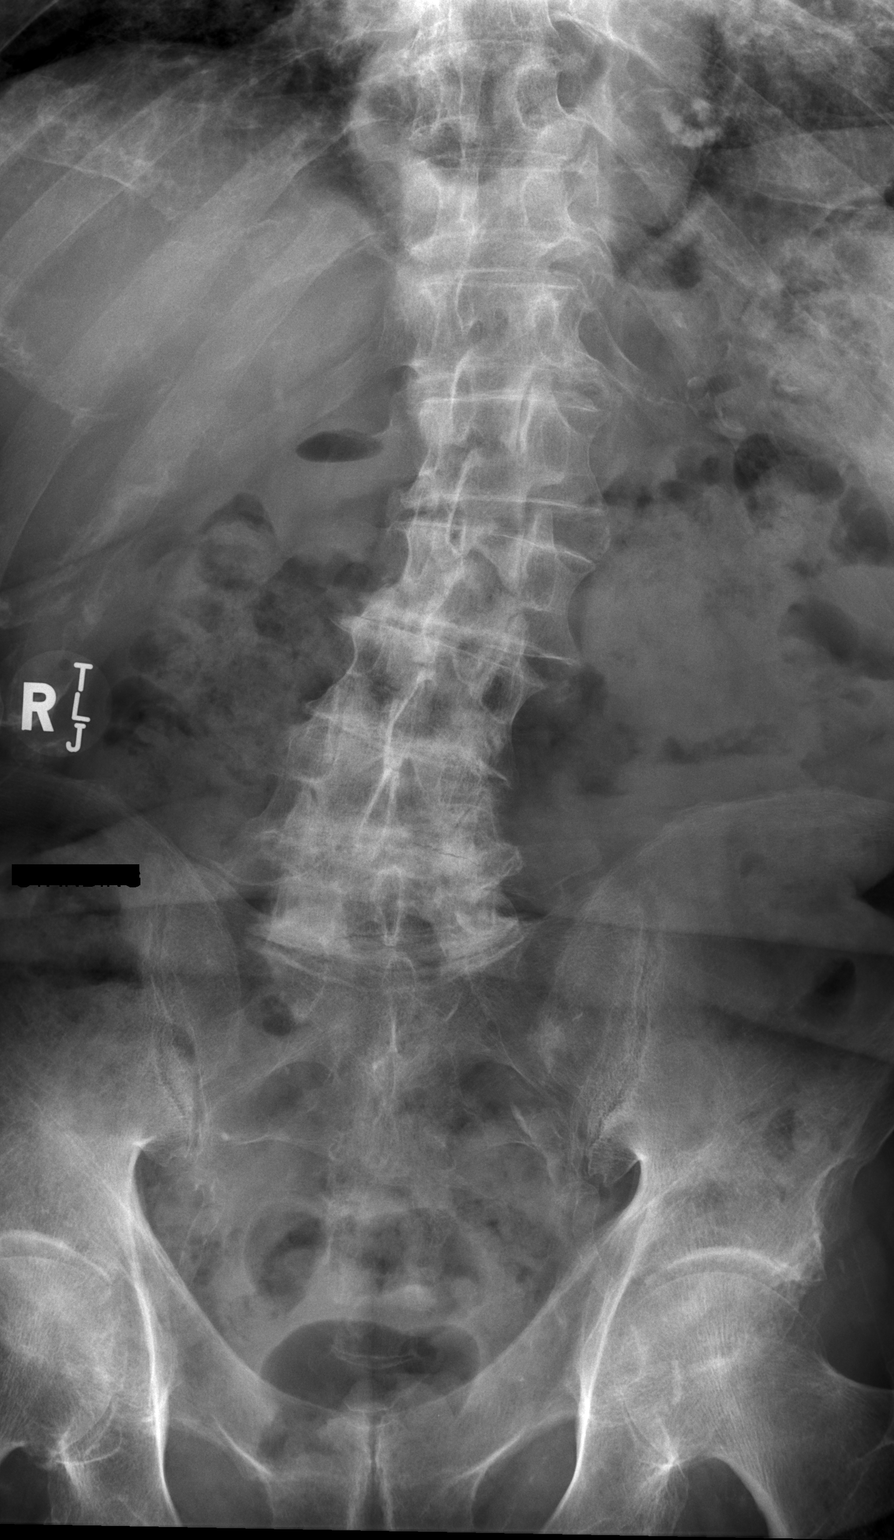

[w lumbar spine lat]
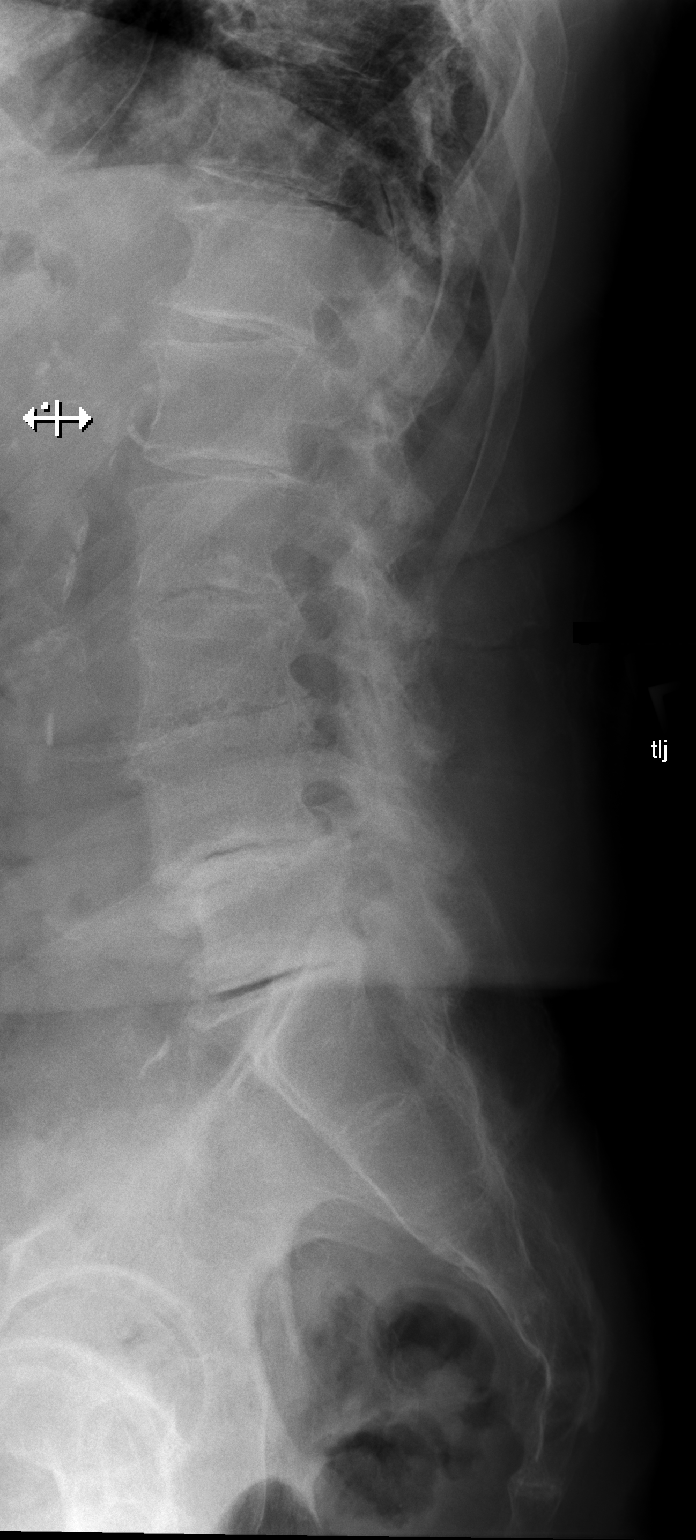

[w lumbar l-5 s-1 spot]
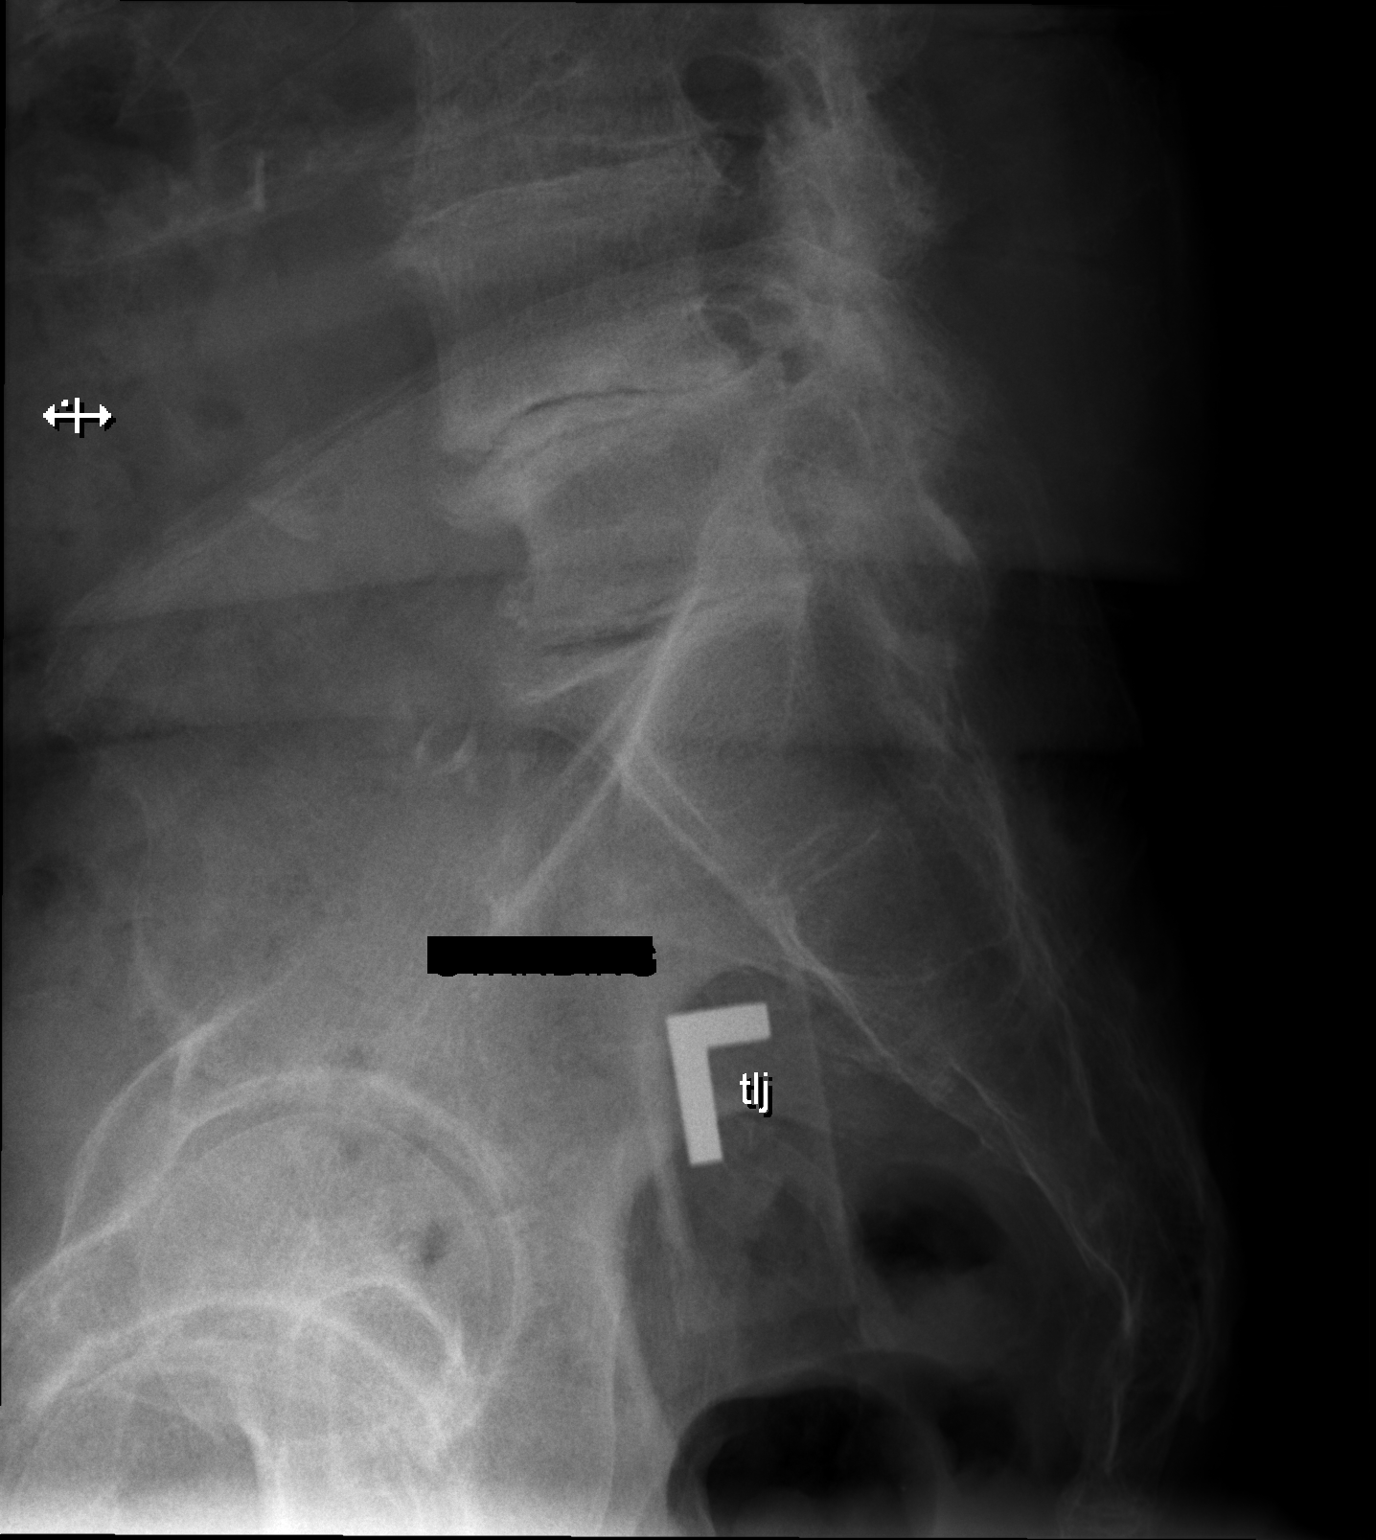

[3 of 3 positions shown; findings below may reference images not displayed]

FINDINGS: No fracture is noted. Minimal grade 1 anterolisthesis of L4-5 is
noted secondary to posterior facet joint hypertrophy. Severe
degenerative disc disease is noted at L2-3, L3-4, L4-5 and L5-S1.
Mild levoscoliosis of lumbar spine is noted. Mild lateral
subluxation to the left of L2-3 is noted.
IMPRESSION: Severe multilevel degenerative disc disease. No acute abnormality
seen in the lumbar spine.

## 2022-02-07 ENCOUNTER — Ambulatory Visit: Payer: Medicare Other | Admitting: Physical Medicine and Rehabilitation

## 2022-02-08 NOTE — Telephone Encounter (Signed)
As per notes

## 2022-03-12 ENCOUNTER — Other Ambulatory Visit: Payer: Self-pay | Admitting: Physical Medicine and Rehabilitation

## 2022-07-27 ENCOUNTER — Emergency Department (HOSPITAL_BASED_OUTPATIENT_CLINIC_OR_DEPARTMENT_OTHER)
Admission: EM | Admit: 2022-07-27 | Discharge: 2022-07-27 | Disposition: A | Discharge status: Home / Self Care | Payer: Medicare Other | Attending: Emergency Medicine | Admitting: Emergency Medicine

## 2022-07-27 ENCOUNTER — Other Ambulatory Visit: Payer: Self-pay

## 2022-07-27 ENCOUNTER — Emergency Department (HOSPITAL_BASED_OUTPATIENT_CLINIC_OR_DEPARTMENT_OTHER): Payer: Medicare Other

## 2022-07-27 ENCOUNTER — Encounter (HOSPITAL_BASED_OUTPATIENT_CLINIC_OR_DEPARTMENT_OTHER): Payer: Self-pay | Admitting: Emergency Medicine

## 2022-07-27 DIAGNOSIS — I10 Essential (primary) hypertension: Secondary | ICD-10-CM | POA: Diagnosis not present

## 2022-07-27 DIAGNOSIS — X58XXXA Exposure to other specified factors, initial encounter: Secondary | ICD-10-CM | POA: Insufficient documentation

## 2022-07-27 DIAGNOSIS — I2510 Atherosclerotic heart disease of native coronary artery without angina pectoris: Secondary | ICD-10-CM | POA: Diagnosis not present

## 2022-07-27 DIAGNOSIS — S76312A Strain of muscle, fascia and tendon of the posterior muscle group at thigh level, left thigh, initial encounter: Secondary | ICD-10-CM | POA: Insufficient documentation

## 2022-07-27 DIAGNOSIS — Z7982 Long term (current) use of aspirin: Secondary | ICD-10-CM | POA: Diagnosis not present

## 2022-07-27 DIAGNOSIS — E119 Type 2 diabetes mellitus without complications: Secondary | ICD-10-CM | POA: Insufficient documentation

## 2022-07-27 DIAGNOSIS — S8992XA Unspecified injury of left lower leg, initial encounter: Secondary | ICD-10-CM | POA: Diagnosis present

## 2022-07-27 DIAGNOSIS — Z79899 Other long term (current) drug therapy: Secondary | ICD-10-CM | POA: Diagnosis not present

## 2022-07-27 NOTE — ED Provider Notes (Addendum)
[facility] EMERGENCY DEPARTMENT Provider Note   CSN: [id number] Arrival date & time: 07/27/22  1056     History  Chief Complaint  Patient presents with   Knee Pain    Jaime [name] is a 88 y.o. male.  Patient is an 88 year old male with a history of diabetes, hypertension, hyperlipidemia, CAD status post stenting who is presenting today with complaint of left leg pain.  Patient reports about a week ago is when he first experienced it and it is in the bottom of his buttocks and in his upper leg in the posterior aspect along his hamstring.  He denies any known injury but reports he is golfing regularly and his wife has MS and he has to help her frequently.  It has been sore and uncomfortable for a week which seem to get better with diclofenac gel.  He felt okay when he got up this morning but then he was walking when it suddenly got worse.  He denies of any hip, back or knee pain.  He has not noticed any rashes in the area.  He denies any numbness or tingling in his foot.  The history is provided by the patient.  Knee Pain      Home Medications Prior to Admission medications   Medication Sig Start Date End Date Taking? Authorizing Provider  aspirin EC 81 MG tablet Take 81 mg by mouth daily.    [provider]  cephALEXin (KEFLEX) 500 MG capsule Take 1 capsule (500 mg total) by mouth 2 (two) times daily. 01/20/22   Tyrell Antonio, MD  diazepam (VALIUM) 5 MG tablet Take one tablet by mouth with food one hour prior to procedure. May repeat 30 minutes prior if needed. 01/16/22   Juanda Chance, NP  gabapentin (NEURONTIN) 300 MG capsule Take 1 capsule (300 mg total) by mouth at bedtime. 12/05/21   Juanda Chance, NP  ibuprofen (ADVIL) 400 MG tablet Take 400 mg by mouth every 6 (six) hours as needed.    [provider]  JARDIANCE 25 MG TABS tablet Take 25 mg by mouth daily. 02/17/20   [provider]  levothyroxine (SYNTHROID) 175 MCG tablet Take  175 mcg by mouth every other day.    [provider]  Metoprolol Succinate 25 MG CS24 Take 25 mg by mouth.    [provider]  mirabegron ER (MYRBETRIQ) 50 MG TB24 tablet Take 50 mg by mouth daily.    [provider]  nitroGLYCERIN (NITROSTAT) 0.4 MG SL tablet Place 1 tablet (0.4 mg total) under the tongue every 5 (five) minutes as needed for up to 25 days for chest pain. 03/15/20 10/06/21  Yates Decamp, MD  rosuvastatin (CRESTOR) 20 MG tablet Take 1 tablet by mouth at bedtime. 03/11/20   [provider]  silodosin (RAPAFLO) 4 MG CAPS capsule Take 4 mg by mouth daily with breakfast.    [provider]  TRESIBA FLEXTOUCH 100 UNIT/ML FlexTouch Pen Inject 17 Units into the skin daily. 03/12/20   [provider]  Vitamin D, Cholecalciferol, 50 MCG (2000 UT) CAPS Take 1 capsule by mouth daily.    [provider]  vitamin E 180 MG (400 UNITS) capsule Take 400 Units by mouth daily.    [provider]  zolpidem (AMBIEN) 10 MG tablet Take 10 mg by mouth at bedtime as needed for sleep.    [provider]      Allergies    Patient has no known allergies.  Review of Systems   Review of Systems  Physical Exam Updated Vital Signs BP (!) 150/86   Pulse 85   Temp 98 F (36.7 C) (Oral)   Resp 18   SpO2 95%  Physical Exam Vitals and nursing note reviewed.  Cardiovascular:     Pulses: Normal pulses.  Musculoskeletal:     Right lower leg: No edema.     Left lower leg: No edema.     Comments: Varicose veins present in bilateral legs but no tenderness or erythema.  Palpable tender hamstring in the left.  Mild tenderness with palpation in the piriformis area.  Full range of motion of the left hip.  Left knee is normal  Neurological:     Mental Status: He is alert. Mental status is at baseline.  Psychiatric:        Mood and Affect: Mood normal.     ED Results / Procedures / Treatments   Labs (all labs ordered are listed,  but only abnormal results are displayed) Labs Reviewed - No data to display  EKG None  Radiology DG Hip Unilat W or Wo Pelvis 2-3 Views Left  Result Date: 07/27/2022 CLINICAL DATA:  Worsening left hip pain extending into knee for the last week. No known injury. EXAM: DG HIP (WITH OR WITHOUT PELVIS) 2-3V LEFT COMPARISON:  AP pelvis 02/06/2022. FINDINGS: The bones are demineralized. There is no evidence of acute fracture, dislocation or femoral head osteonecrosis. Degenerative changes of the hips and lumbar spine are similar to the previous study. There are diffuse vascular calcifications. An electronic device overlying the mid right abdomen is partially imaged. IMPRESSION: No acute findings or explanation for the patient's symptoms. Mild degenerative changes. Electronically Signed   By: Richardean Sale M.D.   On: 07/27/2022 12:58    Procedures Procedures    Medications Ordered in ED Medications - No data to display  ED Course/ Medical Decision Making/ A&P                           Medical Decision Making Amount and/or Complexity of Data Reviewed Radiology: ordered and independent interpretation performed. Decision-making details documented in ED Course.  Risk OTC drugs.   Elderly male presenting today with pain in the back of his left upper leg.  Based on exam patient's pain seems to be related to a strained hamstring.  Also could be some piriformis injury.  He does play golf weekly could be the exacerbating factor.  He denies any recent falls.  He is neurovascularly intact.  No rashes to suggest shingles.  He does have a history of having back surgery and has a stimulator but has no midline back pain and symptoms seem less likely to be sciatica. I have independently visualized and interpreted pt's images today.  Left hip and images negative for acute fracture or bony lesions.  Patient will continue diclofenac gel and Tylenol.  He was instructed to look up hamstring stretches online and  given follow-up with sports medicine.        Final Clinical Impression(s) / ED Diagnoses Final diagnoses:  Hamstring strain, left, initial encounter    Rx / DC Orders ED Discharge Orders     None         Blanchie Dessert, MD 07/27/22 1319    Blanchie Dessert, MD 07/27/22 1320

## 2022-07-27 NOTE — ED Triage Notes (Signed)
Pt POV c/o L thigh pain, radiating behind left knee, intermittent x1 week. Denies injuries, denies falls. Denies swelling, denies heat to area. Advil taken 0730 today, ineffective.

## 2022-07-27 NOTE — Discharge Instructions (Signed)
Continue to use the diclofenac gel and Tylenol as needed for pain.  You need to do stretches which you can look up on the Internet hamstring stretches and they will show you multiple videos on how to stretch her hamstrings.  While it is hurting avoid any activity that makes it hurt worse.  You may have to take a break from golf for short period of time.

## 2022-08-08 ENCOUNTER — Ambulatory Visit: Payer: Medicare Other | Admitting: Family Medicine

## 2022-08-08 NOTE — Therapy (Addendum)
OUTPATIENT PHYSICAL THERAPY LOWER EXTREMITY EVALUATION   Patient Name: Jaime [name] MRN: [id number] DOB:11/21/1930, 88 y.o., male Today's Date: 08/08/2022   PT End of Session - 08/09/22 1100       Visit Number 1     Number of Visits 12     Date for PT Re-Evaluation 09/20/22     Authorization Type MCR & BCBS     Progress Note Due on Visit 10     PT Start Time 1100    PT Stop Time 1150    PT Time Calculation (min) 50 min     Activity Tolerance Patient tolerated treatment well     Behavior During Therapy [facility] for tasks assessed/performed    Past Medical History:  Diagnosis Date   Coronary artery disease 06/04/2009   stenting to the mid LAD with Xience 2.5 x 32 and overlapping 2.5 x 12 mm DES, [place], [place]   Diabetes mellitus without complication (HCC)    Hyperlipidemia    Hypertension    Past Surgical History:  Procedure Laterality Date   CARDIAC CATHETERIZATION  06/04/2009   Stent to LAD in [place], [place]   REPLACEMENT TOTAL KNEE     bOTH   ROTATOR CUFF REPAIR Bilateral    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 Pain started about 3 weeks ago. He had played golf, but didn't feel anything. He went to ER 2 weeks ago because he felt like he was sitting on a knot of  muscle at HS attachment. Limits sitting to about 30 min, standing 10-15 min, can't walk very far. His wife has MS and he is the primary caretaker. He has to help with transfers. Affecting his ability to help sometimes with transfers.   PERTINENT HISTORY: DM, CAD, HTN, bil TKR, Bil RCR, T8/T9 laminectomy June 2023, L3/4 and L5/S1 laminectomies  PAIN:  Are you having pain? Yes: NPRS scale: 5-6 up to 8-9/10 Pain location: distal HS and lateral calf L Pain description: deep aching Aggravating factors: sitting more burning in buttock, standing and walking Relieving factors: in the morning  PRECAUTIONS: Other: spinal cord nerve stimulator for thoracic and lumbar  WEIGHT BEARING RESTRICTIONS No  FALLS:  Has patient fallen in last 6 months? No  LIVING ENVIRONMENT: Lives with: lives with their spouse Lives in: House/apartment Stairs: No Has following equipment at home: None  OCCUPATION: retired  PLOF: Independent  PATIENT GOALS get back to pain free and get back to golf   OBJECTIVE:   DIAGNOSTIC FINDINGS:  DG HIP (WITH OR WITHOUT PELVIS) 2-3V LEFT   COMPARISON:  AP pelvis 02/06/2022.The bones are demineralized. There is no evidence of acute fracture, dislocation or femoral head osteonecrosis. Degenerative changes of the hips and lumbar spine are similar to the previous study. There are diffuse vascular calcifications. An electronic device  overlying the mid right abdomen is partially imaged.   PATIENT SURVEYS:  LEFS  35/80 = 43.89% disability  COGNITION:  Overall cognitive status: Within functional limits for tasks assessed     SENSATION: WFL   MUSCLE LENGTH: Hamstrings: Right 150 deg; Left 150 deg Prone knee bend to 90 deg left and right with tight hip flexors noted  POSTURE: rounded shoulders, forward head, decreased lumbar lordosis, and scoliosis with right iliac crest higher than left.  PALPATION: Palpation: TTP at post left knee with visible knot, biceps  femoris, tendinosis and membranosis. Increased tissue tension in HS muscles bellies, popliteus   LOWER EXTREMITY ROM:   ROM Right eval Left A/P eval  Knee flexion 125 122  Knee extension 0 -2  Ankle dorsiflexion 2 5   WNL otherwise  LOWER EXTREMITY MMT:  MMT Right eval Left eval  Hip flexion 5 5  Hip extension  2+ due to pain; in [facility]  Hip abduction  5  Hip adduction  5  Hip internal rotation    Hip external rotation    Knee flexion 5 4+  Knee extension 5 5  Ankle dorsiflexion 5 5   (Blank rows = not tested)    GAIT: Distance walked: 40 Assistive device utilized: None Level of assistance: Complete Independence Comments: antalgic with decreased stance time left at start of visit, WNL after session    TODAY'S TREATMENT: 08/09/22 See Pt Ed.  Manual Therapy:   Skilled palpation and monitoring of soft tissues during DN Trigger Point Dry-Needling  Treatment instructions: Expect mild to moderate muscle soreness. S/S of pneumothorax if dry needled over a lung field, and to seek immediate medical attention should they occur. Patient verbalized understanding of these instructions and education.  Patient Consent Given: Yes Education handout provided: Yes Muscles treated: left hamstrings, popliteus, lateral gastroc Electrical stimulation performed: No Parameters: N/A Treatment response/outcome: Twitch Response Elicited and Palpable Increase in Muscle Length    PATIENT EDUCATION:  Education details: PT eval findings, anticipated POC, initial HEP, and DN rational, procedure, outcomes, potential side effects, and recommended post-treatment exercises/activity  Person educated: Patient Education method: Explanation, Demonstration, Verbal cues, and Handouts Education comprehension: verbalized understanding and returned demonstration   HOME EXERCISE PROGRAM: Access Code: [email or web address] URL: [email or web address] Date: 08/09/2022 Prepared by:  Raynelle Fanning  Exercises - Seated Hamstring Stretch  - 2 x daily - 7 x weekly - 1 sets - 3 reps - 30-60 sec hold - Seated Table Hamstring Stretch  - 2 x daily - 7 x weekly - 1 sets - 3 reps - 30-60 sec hold - Gastroc Stretch on Wall  - 2 x daily - 7 x weekly - 1 sets - 3 reps - 30-60 sec hold - Seated Hamstring Set  - 2 x daily - 7 x weekly - 1 sets - 10 reps - 10 sec hold  ASSESSMENT:  CLINICAL IMPRESSION: Jhace Fennell  is a 88 y.o. male who was seen today for physical therapy evaluation and treatment for left hamstrings strain. Pain began about 3 weeks ago with insidious onset. Patient demonstrates decreased HS mobility, decreased strength and pain in the left LE. These deficits are affecting his ability to walk, sit and stand as well as play golf. Additionally he is the primary caregiver for his wife who has MS and he is having difficulty helping her transfer. He will benefit from skilled PT to address these deficits and return him to his PLOF. Initial trial of DN performed today with positive results. Patient  reported he hasn't had this much relief since it started. Gait also improved with no deviations.    OBJECTIVE IMPAIRMENTS decreased mobility, difficulty walking, decreased ROM, decreased strength, increased muscle spasms, impaired flexibility, postural dysfunction, and pain.   ACTIVITY LIMITATIONS sitting, standing, and caring for others  PARTICIPATION LIMITATIONS:  golf  PERSONAL FACTORS 3+ comorbidities: DM, CAD, HTN, bil TKR, Bil RCR, T8/T9 laminectomy June 2023, L3/4 and L5/S1 laminectomies  are also affecting patient's functional outcome.   REHAB POTENTIAL: Excellent  CLINICAL DECISION MAKING: Stable/uncomplicated  EVALUATION COMPLEXITY: Low  GOALS: Goals reviewed with patient? Yes  SHORT TERM GOALS: Target date: 08/23/2022 (Remove Blue Hyperlink)  Patient will be independent with initial HEP. Baseline:  Goal status: INITIAL    LONG TERM GOALS: Target date: 09/20/2022  (Remove Blue Hyperlink)  Patient will be independent with advanced/ongoing HEP to improve outcomes and carryover.  Baseline:  Goal status: INITIAL  2.  Patient will report at least 75% improvement in left HS pain to improve QOL. Baseline:  Goal status: INITIAL  3.  Patient will demonstrate improved left HS strength to 5/5 to normalize function including transferring his wife. Baseline:  Goal status: INITIAL  4.  Patient will be able to safely ambulate community distances without feeling of left knee giving way. Baseline:  Goal status: INITIAL  5.  Patient will report >= 44 on LEFS to demonstrate improved functional ability. Baseline: 35/80 = 43.89% disability Goal status: INITIAL  6.  Patient will report increased tolerance to sitting and standing to one hour without increased pain.  Baseline:  Goal status: INITIAL    PLAN: PT FREQUENCY: 2x/week  PT DURATION: 6 weeks  PLANNED INTERVENTIONS: Therapeutic exercises, Therapeutic activity, Neuromuscular re-education, Balance training, Gait training, Patient/Family education, Self Care, Joint mobilization, Stair training, Aquatic Therapy, Dry Needling, Electrical stimulation, Spinal mobilization, Cryotherapy, Moist heat, Taping, Ultrasound, Ionotophoresis 4mg /ml Dexamethasone, and Manual therapy  PLAN FOR NEXT SESSION: Assess response to DN, Review and progress HEP, gentle HS strengthening, manual/DN as indicated.  Stuart Guillen, PT 08/08/2022, 5:29 PM

## 2022-08-09 ENCOUNTER — Ambulatory Visit: Payer: Medicare Other | Attending: Orthopedic Surgery | Admitting: Physical Therapy

## 2022-08-09 ENCOUNTER — Encounter: Payer: Self-pay | Admitting: Physical Therapy

## 2022-08-09 ENCOUNTER — Other Ambulatory Visit: Payer: Self-pay

## 2022-08-09 DIAGNOSIS — R252 Cramp and spasm: Secondary | ICD-10-CM | POA: Diagnosis present

## 2022-08-09 DIAGNOSIS — M6281 Muscle weakness (generalized): Secondary | ICD-10-CM | POA: Diagnosis present

## 2022-08-09 DIAGNOSIS — M79605 Pain in left leg: Secondary | ICD-10-CM | POA: Diagnosis present

## 2022-08-11 ENCOUNTER — Ambulatory Visit: Payer: Medicare Other | Admitting: Physical Therapy

## 2022-08-11 ENCOUNTER — Encounter: Payer: Self-pay | Admitting: Physical Therapy

## 2022-08-11 DIAGNOSIS — R252 Cramp and spasm: Secondary | ICD-10-CM

## 2022-08-11 DIAGNOSIS — M6281 Muscle weakness (generalized): Secondary | ICD-10-CM

## 2022-08-11 DIAGNOSIS — M79605 Pain in left leg: Secondary | ICD-10-CM

## 2022-08-11 NOTE — Therapy (Addendum)
OUTPATIENT PHYSICAL THERAPY TREATMENT   Patient Name: Jaime [name] MRN: [id number] DOB:1931/10/13, 88 y.o., male Today's Date: 08/11/2022   PT End of Session - 08/11/22 1101     Visit Number 2    Number of Visits 12    Date for PT Re-Evaluation 09/20/22    Authorization Type MCR & BCBS    Progress Note Due on Visit 10    PT Start Time 1101    PT Stop Time 1152    PT Time Calculation (min) 51 min    Activity Tolerance Patient tolerated treatment well    Behavior During Therapy WFL for tasks assessed/performed             Past Medical History:  Diagnosis Date   Coronary artery disease 06/04/2009   stenting to the mid LAD with Xience 2.5 x 32 and overlapping 2.5 x 12 mm DES, [place], [place]   Diabetes mellitus without complication (HCC)    Hyperlipidemia    Hypertension    Past Surgical History:  Procedure Laterality Date   CARDIAC CATHETERIZATION  06/04/2009   Stent to LAD in [place], [place]   REPLACEMENT TOTAL KNEE     bOTH   ROTATOR CUFF REPAIR Bilateral    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 Still having tightness in HS  PERTINENT HISTORY: DM, CAD, HTN, bil TKR, Bil RCR, T8/T9 laminectomy June 2023, L3/4 and L5/S1 laminectomies  PAIN:   Are you having pain? Yes: NPRS scale: 6-7/10 Pain location: L lateral calf + tightness in L HS Pain description: deep aching Aggravating factors: sitting more burning in buttock, standing and walking Relieving factors: in the morning  PRECAUTIONS: Other: spinal cord nerve stimulator for thoracic and lumbar  WEIGHT BEARING RESTRICTIONS No  FALLS:  Has patient fallen in last 6 months? No  LIVING ENVIRONMENT: Lives with: lives with their spouse Lives in: House/apartment Stairs: No Has following equipment at home: None  OCCUPATION: retired  PLOF: Independent  PATIENT GOALS: get back to pain free and get back to golf   OBJECTIVE:   DIAGNOSTIC FINDINGS:  DG HIP (WITH OR WITHOUT PELVIS) 2-3V LEFT   COMPARISON:  AP pelvis 02/06/2022.The bones are demineralized. There is no evidence of acute fracture, dislocation or femoral head osteonecrosis. Degenerative changes of the hips and lumbar spine are similar to the previous study. There are diffuse vascular calcifications. An electronic device overlying the mid right abdomen is partially imaged.   PATIENT SURVEYS:  LEFS  35/80 = 43.89% disability  COGNITION:  Overall cognitive status: Within functional limits for tasks assessed     SENSATION: WFL   MUSCLE LENGTH: Hamstrings: Right 150 deg; Left 150 deg Prone knee bend to 90 deg left and right with tight hip flexors noted  POSTURE: rounded shoulders, forward head, decreased  lumbar lordosis, and scoliosis with right iliac crest higher than left.  PALPATION: Palpation: TTP at post left knee with visible knot, biceps femoris, tendinosis and membranosis. Increased tissue tension in HS muscles bellies, popliteus   LOWER EXTREMITY ROM:   ROM Right eval Left A/P eval  Knee flexion 125 122  Knee extension 0 -2  Ankle dorsiflexion 2 5   WNL otherwise  LOWER EXTREMITY MMT:  MMT Right eval Left eval  Hip flexion 5 5  Hip extension  2+ due to pain; in [facility]  Hip  abduction  5  Hip adduction  5  Hip internal rotation    Hip external rotation    Knee flexion 5 4+  Knee extension 5 5  Ankle dorsiflexion 5 5   (Blank rows = not tested)  GAIT: Distance walked: 40 Assistive device utilized: None Level of assistance: Complete Independence Comments: antalgic with decreased stance time left at start of visit, WNL after session    TODAY'S TREATMENT:  08/11/22 THERAPEUTIC EXERCISE: to improve flexibility, strength and mobility.  Verbal and tactile cues throughout for technique. Rec Bike - L2 x 6 min L seated and standing anterior tibialis stretches x 30" each - pt preferring standing L seated figure-4 peroneal stretch x 30" - position difficult for pt with discomfort at knee L long-sitting peroneal stretch with strap x 30"  MANUAL THERAPY: To promote normalized muscle tension, improved flexibility, and reduced pain. Skilled palpation and monitoring of soft tissue during DN Trigger Point Dry-Needling  Treatment instructions: Expect mild to moderate muscle soreness. Patient verbalized understanding of these instructions and education. Patient Consent Given: Yes Education handout provided: Previously provided Muscles treated: L peroneals, L ant tib, L medial and lateral distal HS, L proximal HS Electrical stimulation performed: No Parameters: N/A Treatment response/outcome: Twitch Response Elicited and Palpable Increase in Muscle Length STM/DTM, manual TPR and pin & stretch to muscles addressed with DN  SELF CARE: Instructed pt in self-STM techniques to L HS and peroneals using rolling pin   08/09/22 See Pt Ed.  Manual Therapy:   Skilled palpation and monitoring of soft tissues during DN Trigger Point Dry-Needling  Treatment instructions: Expect mild to moderate muscle soreness. S/S of pneumothorax if dry needled over a lung field, and to seek immediate medical attention should they occur. Patient verbalized understanding of these instructions  and education.  Patient Consent Given: Yes Education handout provided: Yes Muscles treated: left hamstrings, popliteus, lateral gastroc Electrical stimulation performed: No Parameters: N/A Treatment response/outcome: Twitch Response Elicited and Palpable Increase in Muscle Length    PATIENT EDUCATION:  Education details: HEP update - L anterior tib and peroneal stretches, self-STM techniques to L HS and peroneals using rolling pin, and DN rational, procedure, outcomes, potential side effects, and recommended post-treatment exercises/activity Person educated: Patient Education method: Explanation, Demonstration, Verbal cues, and Handouts Education comprehension: verbalized understanding and returned demonstration   HOME EXERCISE PROGRAM: Access Code: [email or web address] URL: [email or web address] Date: 08/11/2022 Prepared by: Glenetta Hew  Exercises - Seated Hamstring Stretch  - 2 x daily - 7 x weekly - 1 sets - 3 reps - 30-60 sec hold - Seated Table Hamstring Stretch  - 2 x daily - 7 x weekly - 1 sets - 3 reps - 30-60 sec hold - Gastroc Stretch on Wall  - 2 x daily - 7 x weekly - 1 sets - 3 reps - 30-60 sec hold - Seated Hamstring Set  - 2 x daily - 7 x weekly - 1 sets -  10 reps - 10 sec hold - Peroneal stretch  - 2 x daily - 7 x weekly - 1 sets - 3 reps - 30 sec hold - Standing Anterior Tibialis Stretch  - 2 x daily - 7 x weekly - 1 sets - 3 reps - 30 sec hold  ASSESSMENT:  CLINICAL IMPRESSION: Izyk reports considerable improvement in L HS pain following DN, noting only tightness today, with pain more localized to L lateral lower leg. He also notes feeling that his foot remains stuck in DF while walking with his weight shifting through his medial foot but limited heel-toe progression and no toe-off at end of stance phase. L ankle noted to rest in significantly greater DF than R in supine with slight eversion - increased muscle tension and TTP evident in L peroneals > anterior  tibialis. Given positive response to DN last visit, address the abnormal muscle tension in these muscles as well as the continued tightness in his HS with MT incorporating DN with good twitch responses elicited resulting in palpable reduction in muscle tension and more relaxed resting position of L foot. Stretches added to HEP targeting peroneals and ant tib and pt instructed in self-STM using a rolling pin to further reduce muscle tension/tightness.  OBJECTIVE IMPAIRMENTS decreased mobility, difficulty walking, decreased ROM, decreased strength, increased muscle spasms, impaired flexibility, postural dysfunction, and pain.   ACTIVITY LIMITATIONS sitting, standing, and caring for others  PARTICIPATION LIMITATIONS:  golf  PERSONAL FACTORS 3+ comorbidities: DM, CAD, HTN, bil TKR, Bil RCR, T8/T9 laminectomy June 2023, L3/4 and L5/S1 laminectomies  are also affecting patient's functional outcome.   REHAB POTENTIAL: Excellent  CLINICAL DECISION MAKING: Stable/uncomplicated  EVALUATION COMPLEXITY: Low  GOALS: Goals reviewed with patient? Yes  SHORT TERM GOALS: Target date: 08/23/2022   Patient will be independent with initial HEP. Baseline:  Goal status: IN PROGRESS   LONG TERM GOALS: Target date: 09/20/2022   Patient will be independent with advanced/ongoing HEP to improve outcomes and carryover.  Baseline:  Goal status: IN PROGRESS  2.  Patient will report at least 75% improvement in left HS pain to improve QOL. Baseline:  Goal status: IN PROGRESS  3.  Patient will demonstrate improved left HS strength to 5/5 to normalize function including transferring his wife. Baseline:  Goal status: IN PROGRESS  4.  Patient will be able to safely ambulate community distances without feeling of left knee giving way. Baseline:  Goal status: IN PROGRESS  5.  Patient will report >= 44 on LEFS to demonstrate improved functional ability. Baseline: 35/80 = 43.89% disability Goal status: IN  PROGRESS  6.  Patient will report increased tolerance to sitting and standing to one hour without increased pain.  Baseline:  Goal status: IN PROGRESS    PLAN: PT FREQUENCY: 2x/week  PT DURATION: 6 weeks  PLANNED INTERVENTIONS: Therapeutic exercises, Therapeutic activity, Neuromuscular re-education, Balance training, Gait training, Patient/Family education, Self Care, Joint mobilization, Stair training, Aquatic Therapy, Dry Needling, Electrical stimulation, Spinal mobilization, Cryotherapy, Moist heat, Taping, Ultrasound, Ionotophoresis 4mg /ml Dexamethasone, and Manual therapy  PLAN FOR NEXT SESSION: Assess response to DN, Review and progress HEP, gentle HS strengthening, manual/DN as indicated.  Percival Spanish, PT 08/11/2022, 12:16 PM

## 2022-08-14 NOTE — Addendum Note (Signed)
Addended by: Jaye Beagle on: 08/14/2022 11:39 AM   Modules accepted: Orders

## 2022-08-15 ENCOUNTER — Ambulatory Visit: Payer: Medicare Other

## 2022-08-15 DIAGNOSIS — R252 Cramp and spasm: Secondary | ICD-10-CM | POA: Diagnosis not present

## 2022-08-15 DIAGNOSIS — M6281 Muscle weakness (generalized): Secondary | ICD-10-CM

## 2022-08-15 DIAGNOSIS — M79605 Pain in left leg: Secondary | ICD-10-CM

## 2022-08-15 NOTE — Therapy (Signed)
OUTPATIENT PHYSICAL THERAPY TREATMENT   Patient Name: Jaime [name] MRN: [id number] DOB:09/18/1931, 88 y.o., male Today's Date: 08/15/2022   PT End of Session - 08/15/22 1015     Visit Number 3    Number of Visits 12    Date for PT Re-Evaluation 09/20/22    Authorization Type MCR & BCBS    Progress Note Due on Visit 10    PT Start Time 0932    PT Stop Time 1012    PT Time Calculation (min) 40 min    Activity Tolerance Patient tolerated treatment well    Behavior During Therapy [facility] for tasks assessed/performed              Past Medical History:  Diagnosis Date   Coronary artery disease 06/04/2009   stenting to the mid LAD with Xience 2.5 x 32 and overlapping 2.5 x 12 mm DES, [place], [place]   Diabetes mellitus without complication (HCC)    Hyperlipidemia    Hypertension    Past Surgical History:  Procedure Laterality Date   CARDIAC CATHETERIZATION  06/04/2009   Stent to LAD in [place], [place]   REPLACEMENT TOTAL KNEE     bOTH   ROTATOR CUFF REPAIR Bilateral    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  PERTINENT HISTORY: DM, CAD, HTN, bil TKR, Bil RCR, T8/T9 laminectomy June 2023, L3/4 and L5/S1 laminectomies  PAIN:  Are you having pain? Yes:  NPRS scale: 6-7/10 Pain location: L lateral calf + tightness in L HS Pain description: deep aching Aggravating factors: sitting more burning in buttock, standing and walking Relieving factors: in the morning  PRECAUTIONS: Other: spinal cord nerve stimulator for thoracic and lumbar  WEIGHT BEARING RESTRICTIONS No  FALLS:  Has patient fallen in last 6 months? No  LIVING ENVIRONMENT: Lives with: lives with their spouse Lives in: House/apartment Stairs: No Has following equipment at home: None  OCCUPATION: retired  PLOF: Independent  PATIENT GOALS: get back to pain free and get back to golf   OBJECTIVE:   DIAGNOSTIC FINDINGS:  DG HIP (WITH OR WITHOUT PELVIS) 2-3V LEFT   COMPARISON:  AP pelvis 02/06/2022.The bones are demineralized. There is no evidence of acute fracture, dislocation or femoral head osteonecrosis. Degenerative changes of the hips and lumbar spine are similar to the previous study. There are diffuse vascular calcifications. An electronic device overlying the mid right abdomen is partially imaged.   PATIENT SURVEYS:  LEFS  35/80 = 43.89% disability  COGNITION:  Overall cognitive status: Within functional limits for tasks assessed     SENSATION: WFL   MUSCLE LENGTH: Hamstrings: Right 150 deg; Left 150 deg Prone knee bend to 90 deg left and right with tight hip flexors noted  POSTURE: rounded shoulders, forward head, decreased lumbar lordosis, and scoliosis with  right iliac crest higher than left.  PALPATION: Palpation: TTP at post left knee with visible knot, biceps femoris, tendinosis and membranosis. Increased tissue tension in HS muscles bellies, popliteus   LOWER EXTREMITY ROM:   ROM Right eval Left A/P eval  Knee flexion 125 122  Knee extension 0 -2  Ankle dorsiflexion 2 5   WNL otherwise  LOWER EXTREMITY MMT:  MMT Right eval Left eval  Hip flexion 5 5  Hip extension  2+ due to pain; in [facility]  Hip abduction  5  Hip adduction  5   Hip internal rotation    Hip external rotation    Knee flexion 5 4+  Knee extension 5 5  Ankle dorsiflexion 5 5   (Blank rows = not tested)  GAIT: Distance walked: 40 Assistive device utilized: None Level of assistance: Complete Independence Comments: antalgic with decreased stance time left at start of visit, WNL after session    TODAY'S TREATMENT: 08/15/22 TherEx: Bike L2x64min Standing gastroc runner stretch x 30 sec Standing ant tib stretch x 30 sec Seated toe raises x 20  3 way ankle TB strengthening (PF, DF, EV) x 10 each Standing heel/toe raises x 10 bil  Manual Therapy: STM to L peroneals and tib anterior  08/11/22 THERAPEUTIC EXERCISE: to improve flexibility, strength and mobility.  Verbal and tactile cues throughout for technique. Rec Bike - L2 x 6 min L seated and standing anterior tibialis stretches x 30" each - pt preferring standing L seated figure-4 peroneal stretch x 30" - position difficult for pt with discomfort at knee L long-sitting peroneal stretch with strap x 30"  MANUAL THERAPY: To promote normalized muscle tension, improved flexibility, and reduced pain. Skilled palpation and monitoring of soft tissue during DN Trigger Point Dry-Needling  Treatment instructions: Expect mild to moderate muscle soreness. Patient verbalized understanding of these instructions and education. Patient Consent Given: Yes Education handout provided: Previously provided Muscles treated: L peroneals, L ant tib, L medial and lateral distal HS, L proximal HS Electrical stimulation performed: No Parameters: N/A Treatment response/outcome: Twitch Response Elicited and Palpable Increase in Muscle Length STM/DTM, manual TPR and pin & stretch to muscles addressed with DN  SELF CARE: Instructed pt in self-STM techniques to L HS and peroneals using rolling pin   08/09/22 See Pt Ed.  Manual Therapy:   Skilled palpation and monitoring of soft tissues during DN Trigger Point  Dry-Needling  Treatment instructions: Expect mild to moderate muscle soreness. S/S of pneumothorax if dry needled over a lung field, and to seek immediate medical attention should they occur. Patient verbalized understanding of these instructions and education.  Patient Consent Given: Yes Education handout provided: Yes Muscles treated: left hamstrings, popliteus, lateral gastroc Electrical stimulation performed: No Parameters: N/A Treatment response/outcome: Twitch Response Elicited and Palpable Increase in Muscle Length    PATIENT EDUCATION:  Education details: HEP update - L anterior tib and peroneal stretches, self-STM techniques to L HS and peroneals using rolling pin, and DN rational, procedure, outcomes, potential side effects, and recommended post-treatment exercises/activity Person educated: Patient Education method: Explanation, Demonstration, Verbal cues, and Handouts Education comprehension: verbalized understanding and returned demonstration   HOME EXERCISE PROGRAM: Access Code: [email or web address] URL: [email or web address] Date: 08/11/2022 Prepared by: Annie Paras  Exercises - Seated Hamstring Stretch  - 2 x daily - 7 x weekly - 1 sets - 3 reps - 30-60 sec hold - Seated Table Hamstring Stretch  - 2 x daily - 7 x weekly - 1 sets -  3 reps - 30-60 sec hold - Gastroc Stretch on Wall  - 2 x daily - 7 x weekly - 1 sets - 3 reps - 30-60 sec hold - Seated Hamstring Set  - 2 x daily - 7 x weekly - 1 sets - 10 reps - 10 sec hold - Peroneal stretch  - 2 x daily - 7 x weekly - 1 sets - 3 reps - 30 sec hold - Standing Anterior Tibialis Stretch  - 2 x daily - 7 x weekly - 1 sets - 3 reps - 30 sec hold  ASSESSMENT:  CLINICAL IMPRESSION: Initiated more strength focused exercises today. MT was done to address muscle tension in peroneals and tib anterior today. After Manual he reported less ache in his leg. During gait, he still doesn't  much toe off. Added new strengthening to  HEP to address functional weakness in L ankle and lower leg. Pt had good response to the progression of strengthening.  OBJECTIVE IMPAIRMENTS decreased mobility, difficulty walking, decreased ROM, decreased strength, increased muscle spasms, impaired flexibility, postural dysfunction, and pain.   ACTIVITY LIMITATIONS sitting, standing, and caring for others  PARTICIPATION LIMITATIONS:  golf  PERSONAL FACTORS 3+ comorbidities: DM, CAD, HTN, bil TKR, Bil RCR, T8/T9 laminectomy June 2023, L3/4 and L5/S1 laminectomies  are also affecting patient's functional outcome.   REHAB POTENTIAL: Excellent  CLINICAL DECISION MAKING: Stable/uncomplicated  EVALUATION COMPLEXITY: Low  GOALS: Goals reviewed with patient? Yes  SHORT TERM GOALS: Target date: 08/23/2022   Patient will be independent with initial HEP. Baseline:  Goal status: IN PROGRESS   LONG TERM GOALS: Target date: 09/20/2022   Patient will be independent with advanced/ongoing HEP to improve outcomes and carryover.  Baseline:  Goal status: IN PROGRESS  2.  Patient will report at least 75% improvement in left HS pain to improve QOL. Baseline:  Goal status: IN PROGRESS  3.  Patient will demonstrate improved left HS strength to 5/5 to normalize function including transferring his wife. Baseline:  Goal status: IN PROGRESS  4.  Patient will be able to safely ambulate community distances without feeling of left knee giving way. Baseline:  Goal status: IN PROGRESS  5.  Patient will report >= 44 on LEFS to demonstrate improved functional ability. Baseline: 35/80 = 43.89% disability Goal status: IN PROGRESS  6.  Patient will report increased tolerance to sitting and standing to one hour without increased pain.  Baseline:  Goal status: IN PROGRESS    PLAN: PT FREQUENCY: 2x/week  PT DURATION: 6 weeks  PLANNED INTERVENTIONS: Therapeutic exercises, Therapeutic activity, Neuromuscular re-education, Balance training, Gait  training, Patient/Family education, Self Care, Joint mobilization, Stair training, Aquatic Therapy, Dry Needling, Electrical stimulation, Spinal mobilization, Cryotherapy, Moist heat, Taping, Ultrasound, Ionotophoresis 4mg /ml Dexamethasone, and Manual therapy  PLAN FOR NEXT SESSION: continue progressing ankle strength; DN to address tension in lateral lower leg; Review and progress HEP, gentle HS strengthening, manual/DN as indicated.  , PTA 08/15/2022, 10:15 AM

## 2022-08-17 ENCOUNTER — Encounter: Payer: Self-pay | Admitting: Physical Therapy

## 2022-08-17 ENCOUNTER — Ambulatory Visit: Payer: Medicare Other | Admitting: Physical Therapy

## 2022-08-17 ENCOUNTER — Encounter (INDEPENDENT_AMBULATORY_CARE_PROVIDER_SITE_OTHER): Payer: Medicare Other | Admitting: Ophthalmology

## 2022-08-17 DIAGNOSIS — R252 Cramp and spasm: Secondary | ICD-10-CM

## 2022-08-17 DIAGNOSIS — M6281 Muscle weakness (generalized): Secondary | ICD-10-CM

## 2022-08-17 DIAGNOSIS — M79605 Pain in left leg: Secondary | ICD-10-CM

## 2022-08-17 NOTE — Therapy (Addendum)
OUTPATIENT PHYSICAL THERAPY TREATMENT   Patient Name: Jaime [name] MRN: [id number] DOB:December 28, 1930, 88 y.o., male Today's Date: 08/17/2022   PT End of Session - 08/17/22 1445     Visit Number 4    Number of Visits 12    Date for PT Re-Evaluation 09/20/22    Authorization Type MCR & BCBS    Progress Note Due on Visit 10    PT Start Time 5681    PT Stop Time 1524    PT Time Calculation (min) 39 min    Activity Tolerance Patient tolerated treatment well    Behavior During Therapy WFL for tasks assessed/performed               Past Medical History:  Diagnosis Date   Coronary artery disease 06/04/2009   stenting to the mid LAD with Xience 2.5 x 32 and overlapping 2.5 x 12 mm DES, [place], [place]   Diabetes mellitus without complication ([place])    Hyperlipidemia    Hypertension    Past Surgical History:  Procedure Laterality Date   CARDIAC CATHETERIZATION  06/04/2009   Stent to LAD in [place], [place]     bOTH   [place] Bilateral    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  PAIN:  Are you having pain? Yes: NPRS scale: 4/10 Pain location: L lateral calf + tightness in L HS Pain description: dull  aching Aggravating factors: sitting more burning in buttock, standing and walking Relieving factors: in the morning  PERTINENT HISTORY: DM, CAD, HTN, bil TKR, Bil RCR, T8/T9 laminectomy June 2023, L3/4 and L5/S1 laminectomies  PRECAUTIONS: Other: spinal cord nerve stimulator for thoracic and lumbar  WEIGHT BEARING RESTRICTIONS No  FALLS:  Has patient fallen in last 6 months? No  LIVING ENVIRONMENT: Lives with: lives with their spouse Lives in: House/apartment Stairs: No Has following equipment at home: None  OCCUPATION: retired  PLOF: Independent  PATIENT GOALS: get back to pain free and get back to golf   OBJECTIVE:   DIAGNOSTIC FINDINGS:  DG HIP (WITH OR WITHOUT PELVIS) 2-3V LEFT   COMPARISON:  AP pelvis 02/06/2022.The bones are demineralized. There is no evidence of acute fracture, dislocation or femoral head osteonecrosis. Degenerative changes of the hips and lumbar spine are similar to the previous study. There are diffuse vascular calcifications. An electronic device overlying the mid right abdomen is partially imaged.   PATIENT SURVEYS:  LEFS  35/80 = 43.89% disability  COGNITION:  Overall cognitive status: Within functional limits for tasks assessed     SENSATION: WFL   MUSCLE LENGTH: Hamstrings: Right 150 deg; Left 150 deg Prone knee bend to 90 deg left and right with tight hip flexors noted  POSTURE: rounded shoulders, forward head,  decreased lumbar lordosis, and scoliosis with right iliac crest higher than left.  PALPATION: Palpation: TTP at post left knee with visible knot, biceps femoris, tendinosis and membranosis. Increased tissue tension in HS muscles bellies, popliteus   LOWER EXTREMITY ROM:   ROM Right eval Left A/P eval  Knee flexion 125 122  Knee extension 0 -2  Ankle dorsiflexion 2 5   WNL otherwise  LOWER EXTREMITY MMT:  MMT Right eval Left eval  Hip flexion 5 5  Hip extension  2+ due to pain; in [facility]  Hip abduction  5   Hip adduction  5  Hip internal rotation    Hip external rotation    Knee flexion 5 4+  Knee extension 5 5  Ankle dorsiflexion 5 5   (Blank rows = not tested)  GAIT: Distance walked: 40 Assistive device utilized: None Level of assistance: Complete Independence Comments: antalgic with decreased stance time left at start of visit, WNL after session    TODAY'S TREATMENT:  08/17/22 THERAPEUTIC EXERCISE: to improve flexibility, strength and mobility.  Verbal and tactile cues throughout for technique. Rec Bike - L2 x 6 min Seated RTB B hip ABD/ER clam 10 x 3" Seated RTB hip flexion march 10 x 3" Seated RTB R/L LAQ x 10 Seated RTB R/L HS curl x 10 Standing RTB alt hip abduction x 10 Standing RTB alt hip extension x 10 Standing RTB alt hip flexion x 10 Supported squat + RTB hip ABD isometric x 10 Seated RTB R/L leg press x 10 Seated R/L Fitter leg press (1 black/1 blue) x 10 [employer] pews x 20 Standing L blue TB TKE 2 x 10   08/15/22 TherEx: Bike L2x100mn Standing gastroc runner stretch x 30 sec Standing ant tib stretch x 30 sec Seated toe raises x 20  3 way ankle TB strengthening (PF, DF, EV) x 10 each Standing heel/toe raises x 10 bil  Manual Therapy: STM to L peroneals and tib anterior   08/11/22 THERAPEUTIC EXERCISE: to improve flexibility, strength and mobility.  Verbal and tactile cues throughout for technique. Rec Bike - L2 x 6 min L seated and standing anterior tibialis stretches x 30" each - pt preferring standing L seated figure-4 peroneal stretch x 30" - position difficult for pt with discomfort at knee L long-sitting peroneal stretch with strap x 30"  MANUAL THERAPY: To promote normalized muscle tension, improved flexibility, and reduced pain. Skilled palpation and monitoring of soft tissue during DN Trigger Point Dry-Needling  Treatment instructions: Expect mild to moderate muscle soreness. Patient verbalized understanding of these instructions and  education. Patient Consent Given: Yes Education handout provided: Previously provided Muscles treated: L peroneals, L ant tib, L medial and lateral distal HS, L proximal HS Electrical stimulation performed: No Parameters: N/A Treatment response/outcome: Twitch Response Elicited and Palpable Increase in Muscle Length STM/DTM, manual TPR and pin & stretch to muscles addressed with DN  SELF CARE: Instructed pt in self-STM techniques to L HS and peroneals using rolling pin   PATIENT EDUCATION:  Education details: HEP update - L anterior tib and peroneal stretches, self-STM techniques to L HS and peroneals using rolling pin, and DN rational, procedure, outcomes, potential side effects, and recommended post-treatment exercises/activity Person educated: Patient Education method: Explanation, Demonstration, Verbal cues, and Handouts Education comprehension: verbalized understanding and returned demonstration   HOME EXERCISE PROGRAM: Access Code: [email or web address]: [email or web address] Date: 08/17/2022 Prepared by: JAnnie Paras Exercises - Seated Hamstring Stretch  - 2 x daily - 7  x weekly - 1 sets - 3 reps - 30-60 sec hold - Seated Table Hamstring Stretch  - 2 x daily - 7 x weekly - 1 sets - 3 reps - 30-60 sec hold - Gastroc Stretch on Wall  - 2 x daily - 7 x weekly - 1 sets - 3 reps - 30-60 sec hold - Seated Hamstring Set  - 2 x daily - 7 x weekly - 1 sets - 10 reps - 10 sec hold - Peroneal stretch  - 2 x daily - 7 x weekly - 1 sets - 3 reps - 30 sec hold - Standing Anterior Tibialis Stretch  - 2 x daily - 7 x weekly - 1 sets - 3 reps - 30 sec hold - Heel Raises with Counter Support  - 1 x daily - 7 x weekly - 2 sets - 10 reps - Toe Raises with Counter Support  - 1 x daily - 7 x weekly - 2 sets - 10 reps - Seated Knee Extension with Anchored Resistance  - 1 x daily - 7 x weekly - 2 sets - 10 reps - 3 sec hold - Seated Knee Flexion with Anchored Resistance  - 1 x daily - 7 x  weekly - 2 sets - 10 reps - 3 sec hold  ASSESSMENT:  CLINICAL IMPRESSION: Jabez reports his HEP is going well and denies need for further review - STG met. His pain has been subsiding but he notes continued weakness especially at the knees, therefore continued emphasis on strengthening targeting proximal LE musculature. All exercises well tolerated but pt requesting only band resisted knee flexion/extension for HEP.  OBJECTIVE IMPAIRMENTS decreased mobility, difficulty walking, decreased ROM, decreased strength, increased muscle spasms, impaired flexibility, postural dysfunction, and pain.   ACTIVITY LIMITATIONS sitting, standing, and caring for others  PARTICIPATION LIMITATIONS:  golf  PERSONAL FACTORS 3+ comorbidities: DM, CAD, HTN, bil TKR, Bil RCR, T8/T9 laminectomy June 2023, L3/4 and L5/S1 laminectomies  are also affecting patient's functional outcome.   REHAB POTENTIAL: Excellent  CLINICAL DECISION MAKING: Stable/uncomplicated  EVALUATION COMPLEXITY: Low   GOALS: Goals reviewed with patient? Yes  SHORT TERM GOALS: Target date: 08/23/2022   Patient will be independent with initial HEP. Baseline:  Goal status: MET  - 08/17/22   LONG TERM GOALS: Target date: 09/20/2022   Patient will be independent with advanced/ongoing HEP to improve outcomes and carryover.  Baseline:  Goal status: IN PROGRESS  2.  Patient will report at least 75% improvement in left HS pain to improve QOL. Baseline:  Goal status: IN PROGRESS  3.  Patient will demonstrate improved left HS strength to 5/5 to normalize function including transferring his wife. Baseline:  Goal status: IN PROGRESS  4.  Patient will be able to safely ambulate community distances without feeling of left knee giving way. Baseline:  Goal status: IN PROGRESS  5.  Patient will report >= 44 on LEFS to demonstrate improved functional ability. Baseline: 35/80 = 43.89% disability Goal status: IN PROGRESS  6.  Patient  will report increased tolerance to sitting and standing to one hour without increased pain.  Baseline:  Goal status: IN PROGRESS    PLAN: PT FREQUENCY: 2x/week  PT DURATION: 6 weeks  PLANNED INTERVENTIONS: Therapeutic exercises, Therapeutic activity, Neuromuscular re-education, Balance training, Gait training, Patient/Family education, Self Care, Joint mobilization, Stair training, Aquatic Therapy, Dry Needling, Electrical stimulation, Spinal mobilization, Cryotherapy, Moist heat, Taping, Ultrasound, Ionotophoresis 69m/ml Dexamethasone, and Manual therapy  PLAN FOR NEXT SESSION: continue  progressing ankle and proximal LE strength; DN to address tension in lateral lower leg; review and progress HEP, manual/DN as indicated.  Percival Spanish, PT 08/17/2022, 3:36 PM

## 2022-08-23 ENCOUNTER — Ambulatory Visit: Payer: Medicare Other | Admitting: Physical Therapy

## 2022-08-23 ENCOUNTER — Encounter: Payer: Self-pay | Admitting: Physical Therapy

## 2022-08-23 DIAGNOSIS — R252 Cramp and spasm: Secondary | ICD-10-CM

## 2022-08-23 DIAGNOSIS — M6281 Muscle weakness (generalized): Secondary | ICD-10-CM

## 2022-08-23 DIAGNOSIS — M79605 Pain in left leg: Secondary | ICD-10-CM

## 2022-08-23 NOTE — Therapy (Signed)
OUTPATIENT PHYSICAL THERAPY TREATMENT   Patient Name: Jaime [name] MRN: [id number] DOB:Apr 13, 1931, 88 y.o., male Today's Date: 08/23/2022   PT End of Session - 08/23/22 1147     Visit Number 5    Number of Visits 12    Date for PT Re-Evaluation 09/20/22    Authorization Type MCR & BCBS    Progress Note Due on Visit 10    PT Start Time 1147    PT Stop Time 1232    PT Time Calculation (min) 45 min    Activity Tolerance Patient tolerated treatment well    Behavior During Therapy [facility] for tasks assessed/performed                Past Medical History:  Diagnosis Date   Coronary artery disease 06/04/2009   stenting to the mid LAD with Xience 2.5 x 32 and overlapping 2.5 x 12 mm DES, [place], [place]   Diabetes mellitus without complication ([place])    Hyperlipidemia    Hypertension    Past Surgical History:  Procedure Laterality Date   CARDIAC CATHETERIZATION  06/04/2009   Stent to LAD in [place], [place]     bOTH   [place] Bilateral    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  PAIN:  Are you having  pain? Yes: NPRS scale: 3-4/10 Pain location: L HS, esp at ischial tuberosity Pain description: dull aching Aggravating factors: sitting more burning in buttock, standing and walking Relieving factors: in the morning  PERTINENT HISTORY: DM, CAD, HTN, bil TKR, Bil RCR, T8/T9 laminectomy June 2023, L3/4 and L5/S1 laminectomies  PRECAUTIONS: Other: spinal cord nerve stimulator for thoracic and lumbar  WEIGHT BEARING RESTRICTIONS No  FALLS:  Has patient fallen in last 6 months? No  LIVING ENVIRONMENT: Lives with: lives with their spouse Lives in: House/apartment Stairs: No Has following equipment at home: None  OCCUPATION: retired  PLOF: Independent  PATIENT GOALS: get back to pain free and get back to golf   OBJECTIVE:   DIAGNOSTIC FINDINGS:  DG HIP (WITH OR WITHOUT PELVIS) 2-3V LEFT   COMPARISON:  AP pelvis 02/06/2022.The bones are demineralized. There is no evidence of acute fracture, dislocation or femoral head osteonecrosis. Degenerative changes of the hips and lumbar spine are similar to the previous study. There are diffuse vascular calcifications. An electronic device overlying the mid right abdomen is partially imaged.   PATIENT SURVEYS:  LEFS  35/80 = 43.89% disability  COGNITION:  Overall cognitive status: Within functional limits for tasks assessed     SENSATION: WFL   MUSCLE LENGTH: Hamstrings: Right 150 deg; Left 150 deg Prone knee bend  to 90 deg left and right with tight hip flexors noted  POSTURE: rounded shoulders, forward head, decreased lumbar lordosis, and scoliosis with right iliac crest higher than left.  PALPATION: Palpation: TTP at post left knee with visible knot, biceps femoris, tendinosis and membranosis. Increased tissue tension in HS muscles bellies, popliteus   LOWER EXTREMITY ROM:   ROM Right eval Left A/P eval  Knee flexion 125 122  Knee extension 0 -2  Ankle dorsiflexion 2 5   WNL otherwise  LOWER EXTREMITY MMT:  MMT  Right eval Left eval  Hip flexion 5 5  Hip extension  2+ due to pain; in [facility]  Hip abduction  5  Hip adduction  5  Hip internal rotation    Hip external rotation    Knee flexion 5 4+  Knee extension 5 5  Ankle dorsiflexion 5 5   (Blank rows = not tested)  GAIT: Distance walked: 40 Assistive device utilized: None Level of assistance: Complete Independence Comments: antalgic with decreased stance time left at start of visit, WNL after session    TODAY'S TREATMENT:  08/23/22 THERAPEUTIC EXERCISE: to improve flexibility, strength and mobility.  Verbal and tactile cues throughout for technique. Rec Bike - L2 x 6 min Standing lateral lean L ITB stretch 2 x 30"  MANUAL THERAPY: To promote normalized muscle tension, improved flexibility, improved joint mobility, increased ROM, and reduced pain. Skilled palpation and monitoring of soft tissue during DN Trigger Point Dry-Needling  Treatment instructions: Expect mild to moderate muscle soreness. S/S of pneumothorax if dry needled over a lung field, and to seek immediate medical attention should they occur. Patient verbalized understanding of these instructions and education. Patient Consent Given: Yes Education handout provided: Previously provided Muscles treated: L proximal HS, L medial & lateral distal HS, L medial/lateral piriformis, L lower glute max Electrical stimulation performed: No Parameters: N/A Treatment response/outcome: Twitch Response Elicited and Palpable Increase in Muscle Length STM/DTM, manual TPR and pin & stretch to muscles addressed with DN MFR to L ITB   08/17/22 THERAPEUTIC EXERCISE: to improve flexibility, strength and mobility.  Verbal and tactile cues throughout for technique. Rec Bike - L2 x 6 min Seated RTB B hip ABD/ER clam 10 x 3" Seated RTB hip flexion march 10 x 3" Seated RTB R/L LAQ x 10 Seated RTB R/L HS curl x 10 Standing RTB alt hip abduction x 10 Standing RTB alt hip extension x  10 Standing RTB alt hip flexion x 10 Supported squat + RTB hip ABD isometric x 10 Seated RTB R/L leg press x 10 Seated R/L Fitter leg press (1 black/1 blue) x 10 Church pews x 20 Standing L blue TB TKE 2 x 10   08/15/22 TherEx: Bike L2x43mn Standing gastroc runner stretch x 30 sec Standing ant tib stretch x 30 sec Seated toe raises x 20  3 way ankle TB strengthening (PF, DF, EV) x 10 each Standing heel/toe raises x 10 bil  Manual Therapy: STM to L peroneals and tib anterior   PATIENT EDUCATION:  Education details: HEP update - L anterior tib and peroneal stretches, self-STM techniques to L HS and peroneals using rolling pin, and DN rational, procedure, outcomes, potential side effects, and recommended post-treatment exercises/activity Person educated: Patient Education method: Explanation, Demonstration, Verbal cues, and Handouts Education comprehension: verbalized understanding and returned demonstration   HOME EXERCISE PROGRAM: Access Code: [email or web address]: [email or web address] Date: 08/23/2022 Prepared by: JAnnie Paras Exercises - Seated Hamstring Stretch  - 2 x daily -  7 x weekly - 1 sets - 3 reps - 30-60 sec hold - Seated Table Hamstring Stretch  - 2 x daily - 7 x weekly - 1 sets - 3 reps - 30-60 sec hold - Gastroc Stretch on Wall  - 2 x daily - 7 x weekly - 1 sets - 3 reps - 30-60 sec hold - Seated Hamstring Set  - 2 x daily - 7 x weekly - 1 sets - 10 reps - 10 sec hold - Peroneal stretch  - 2 x daily - 7 x weekly - 1 sets - 3 reps - 30 sec hold - Standing Anterior Tibialis Stretch  - 2 x daily - 7 x weekly - 1 sets - 3 reps - 30 sec hold - Heel Raises with Counter Support  - 1 x daily - 7 x weekly - 2 sets - 10 reps - Toe Raises with Counter Support  - 1 x daily - 7 x weekly - 2 sets - 10 reps - Seated Knee Extension with Anchored Resistance  - 1 x daily - 7 x weekly - 2 sets - 10 reps - 3 sec hold - Seated Knee Flexion with Anchored Resistance  - 1 x  daily - 7 x weekly - 2 sets - 10 reps - 3 sec hold - Standing ITB Stretch  - 2 x daily - 7 x weekly - 3 reps - 30 sec hold  ASSESSMENT:  CLINICAL IMPRESSION: Zyion reports the distal LE pain and tightness is no longer bothering him. Proximally, he notes the HS issues are not too bad when he is moving around but worsen when he tries to sit down for dinner - pain increases significantly at the ischial tuberosity and then extends down the HS until he can no longer tolerate sitting. Continued taut tender bands identified both proximally and distally in the L HS as well as in L lower glute maximus and piriformis - these were addressed with further MT incorporating DN with good twitch responses elicited resulting in reduction in muscle tension and TTP with pt reporting decreased discomfort upon moving. Tightness also identified in L ITB which was addressed with MFR and instruction in ITB stretch. We also discussed possibility of trial of kinesiotaping and/or ionto patch to address tenderness over ischial tuberosity but pt requesting to defer anything new until he sees how he responds to today's DN.   OBJECTIVE IMPAIRMENTS decreased mobility, difficulty walking, decreased ROM, decreased strength, increased muscle spasms, impaired flexibility, postural dysfunction, and pain.   ACTIVITY LIMITATIONS sitting, standing, and caring for others  PARTICIPATION LIMITATIONS:  golf  PERSONAL FACTORS 3+ comorbidities: DM, CAD, HTN, bil TKR, Bil RCR, T8/T9 laminectomy June 2023, L3/4 and L5/S1 laminectomies  are also affecting patient's functional outcome.   REHAB POTENTIAL: Excellent  CLINICAL DECISION MAKING: Stable/uncomplicated  EVALUATION COMPLEXITY: Low   GOALS: Goals reviewed with patient? Yes  SHORT TERM GOALS: Target date: 08/23/2022   Patient will be independent with initial HEP. Baseline:  Goal status: MET  - 08/17/22   LONG TERM GOALS: Target date: 09/20/2022   Patient will be independent  with advanced/ongoing HEP to improve outcomes and carryover.  Baseline:  Goal status: IN PROGRESS  2.  Patient will report at least 75% improvement in left HS pain to improve QOL. Baseline:  Goal status: IN PROGRESS  3.  Patient will demonstrate improved left HS strength to 5/5 to normalize function including transferring his wife. Baseline:  Goal status: IN PROGRESS  4.  Patient will be able to safely ambulate community distances without feeling of left knee giving way. Baseline:  Goal status: IN PROGRESS  5.  Patient will report >= 44 on LEFS to demonstrate improved functional ability. Baseline: 35/80 = 43.89% disability Goal status: IN PROGRESS  6.  Patient will report increased tolerance to sitting and standing to one hour without increased pain.  Baseline:  Goal status: IN PROGRESS    PLAN: PT FREQUENCY: 2x/week  PT DURATION: 6 weeks  PLANNED INTERVENTIONS: Therapeutic exercises, Therapeutic activity, Neuromuscular re-education, Balance training, Gait training, Patient/Family education, Self Care, Joint mobilization, Stair training, Aquatic Therapy, Dry Needling, Electrical stimulation, Spinal mobilization, Cryotherapy, Moist heat, Taping, Ultrasound, Ionotophoresis 2m/ml Dexamethasone, and Manual therapy  PLAN FOR NEXT SESSION: continue progressing ankle and proximal LE strength; review and progress HEP, manual/DN as indicated, possible K-tape to L ischial tuberosity +/- HS vs ionto patch to L ischial tuberosity.   JPercival Spanish PT 08/23/2022, 12:47 PM

## 2022-08-24 ENCOUNTER — Encounter (INDEPENDENT_AMBULATORY_CARE_PROVIDER_SITE_OTHER): Payer: Medicare Other | Admitting: Ophthalmology

## 2022-08-25 ENCOUNTER — Ambulatory Visit: Payer: Medicare Other | Admitting: Physical Therapy

## 2022-08-25 ENCOUNTER — Encounter: Payer: Self-pay | Admitting: Physical Therapy

## 2022-08-25 DIAGNOSIS — M6281 Muscle weakness (generalized): Secondary | ICD-10-CM

## 2022-08-25 DIAGNOSIS — M79605 Pain in left leg: Secondary | ICD-10-CM

## 2022-08-25 DIAGNOSIS — R252 Cramp and spasm: Secondary | ICD-10-CM | POA: Diagnosis not present

## 2022-08-25 NOTE — Therapy (Signed)
OUTPATIENT PHYSICAL THERAPY TREATMENT   Patient Name: Jaime [name] MRN: [id number] DOB:Apr 07, 1931, 88 y.o., male Today's Date: 08/25/2022   PT End of Session - 08/25/22 0802     Visit Number 6    Number of Visits 12    Date for PT Re-Evaluation 09/20/22    Authorization Type MCR & BCBS    Progress Note Due on Visit 10    PT Start Time 0802    PT Stop Time 0842    PT Time Calculation ([name]) 40 [name]    Activity Tolerance Patient tolerated treatment well    Behavior During Therapy [facility] for tasks assessed/performed                Past Medical History:  Diagnosis Date   Coronary artery disease 06/04/2009   stenting to the mid LAD with Xience 2.5 x 32 and overlapping 2.5 x 12 mm DES, [place], [place]   Diabetes mellitus without complication ([place])    Hyperlipidemia    Hypertension    Past Surgical History:  Procedure Laterality Date   CARDIAC CATHETERIZATION  06/04/2009   Stent to LAD in [place], [place]     bOTH   [place] Bilateral    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 Denies pain this morning but still feels a weakness in his L leg. He states he thinks he wants to schedule a follow-up with Dr.  Raeford Razor.  PAIN:  Are you having pain? No  PERTINENT HISTORY: DM, CAD, HTN, bil TKR, Bil RCR, T8/T9 laminectomy June 2023, L3/4 and L5/S1 laminectomies  PRECAUTIONS: Other: spinal cord nerve stimulator for thoracic and lumbar  WEIGHT BEARING RESTRICTIONS No  FALLS:  Has patient fallen in last 6 months? No  LIVING ENVIRONMENT: Lives with: lives with their spouse Lives in: House/apartment Stairs: No Has following equipment at home: None  OCCUPATION: retired  PLOF: Independent  PATIENT GOALS: get back to pain free and get back to golf   OBJECTIVE:   DIAGNOSTIC FINDINGS:  DG HIP (WITH OR WITHOUT PELVIS) 2-3V LEFT   COMPARISON:  AP pelvis 02/06/2022.The bones are demineralized. There is no evidence of acute fracture, dislocation or femoral head osteonecrosis. Degenerative changes of the hips and lumbar spine are similar to the previous study. There are diffuse vascular calcifications. An electronic device overlying the mid right abdomen is partially imaged.  PATIENT SURVEYS:  LEFS  35/80 = 43.89% disability  COGNITION:  Overall cognitive status: Within functional limits for tasks assessed     SENSATION: WFL  MUSCLE LENGTH: Hamstrings: Right 150 deg; Left 150 deg Prone knee bend to 90 deg left and right with tight hip flexors noted  POSTURE: rounded shoulders, forward head, decreased lumbar lordosis, and scoliosis with right iliac  crest higher than left.  PALPATION: Palpation: TTP at post left knee with visible knot, biceps femoris, tendinosis and membranosis. Increased tissue tension in HS muscles bellies, popliteus  LOWER EXTREMITY ROM:   ROM Right eval Left A/P eval  Knee flexion 125 122  Knee extension 0 -2  Ankle dorsiflexion 2 5   WNL otherwise  LOWER EXTREMITY MMT:  MMT Right eval Left eval  Hip flexion 5 5  Hip extension  2+ due to pain; in [facility]  Hip abduction  5  Hip adduction  5  Hip internal rotation    Hip external rotation    Knee  flexion 5 4+  Knee extension 5 5  Ankle dorsiflexion 5 5   (Blank rows = not tested)  GAIT: Distance walked: 40 Assistive device utilized: None Level of assistance: Complete Independence Comments: antalgic with decreased stance time left at start of visit, WNL after session    TODAY'S TREATMENT:  08/25/22 THERAPEUTIC EXERCISE: to improve flexibility, strength and mobility.  Verbal and tactile cues throughout for technique. NuStep - L5 x 6 [name] (LE only) TRX squat x 10 TRX lunge x 10 bil Golfer's lift/single leg RDL with 4# x 10 bil B side-stepping along counter with RTB at ankles 3 x 10 ft Fwd/back monster walk along counter with RTB at ankles 3 x 27f Standing RTB march x 10, UE support on counter BATCA leg press B LE - 25# 2 x 10; L only - 10# 2 x 10 BATCA knee flexion B LE - 15# 2 x 10, B con/L ecc - 10# 2 x 10  MANUAL THERAPY: To promote normalized muscle tension and reduced pain.  Ktape - 50% star over L ischial tuberosity   08/23/22 THERAPEUTIC EXERCISE: to improve flexibility, strength and mobility.  Verbal and tactile cues throughout for technique. Rec Bike - L2 x 6 [name] Standing lateral lean L ITB stretch 2 x 30"  MANUAL THERAPY: To promote normalized muscle tension, improved flexibility, improved joint mobility, increased ROM, and reduced pain. Skilled palpation and monitoring of soft tissue during DN Trigger Point Dry-Needling  Treatment instructions: Expect mild to moderate muscle soreness. S/S of pneumothorax if dry needled over a lung field, and to seek immediate medical attention should they occur. Patient verbalized understanding of these instructions and education. Patient Consent Given: Yes Education handout provided: Previously provided Muscles treated: L proximal HS, L medial & lateral distal HS, L medial/lateral piriformis, L lower glute max Electrical stimulation performed: No Parameters: N/A Treatment response/outcome: Twitch Response Elicited and  Palpable Increase in Muscle Length STM/DTM, manual TPR and pin & stretch to muscles addressed with DN MFR to L ITB   08/17/22 THERAPEUTIC EXERCISE: to improve flexibility, strength and mobility.  Verbal and tactile cues throughout for technique. Rec Bike - L2 x 6 [name] Seated RTB B hip ABD/ER clam 10 x 3" Seated RTB hip flexion march 10 x 3" Seated RTB R/L LAQ x 10 Seated RTB R/L HS curl x 10 Standing RTB alt hip abduction x 10 Standing RTB alt hip extension x 10 Standing RTB alt hip flexion x 10 Supported squat + RTB hip ABD isometric x 10 Seated RTB R/L leg press x 10 Seated R/L Fitter leg press (1 black/1 blue) x 10 Church pews x 20 Standing L blue TB TKE 2 x 10   PATIENT EDUCATION:  Education details: HEP update - L anterior tib and peroneal stretches, self-STM techniques to L HS and peroneals using rolling pin, and DN rational,  procedure, outcomes, potential side effects, and recommended post-treatment exercises/activity Person educated: Patient Education method: Explanation, Demonstration, Verbal cues, and Handouts Education comprehension: verbalized understanding and returned demonstration   HOME EXERCISE PROGRAM: Access Code: [email or web address] URL: [email or web address] Date: 08/23/2022 Prepared by: Annie Paras  Exercises - Seated Hamstring Stretch  - 2 x daily - 7 x weekly - 1 sets - 3 reps - 30-60 sec hold - Seated Table Hamstring Stretch  - 2 x daily - 7 x weekly - 1 sets - 3 reps - 30-60 sec hold - Gastroc Stretch on Wall  - 2 x daily - 7 x weekly - 1 sets - 3 reps - 30-60 sec hold - Seated Hamstring Set  - 2 x daily - 7 x weekly - 1 sets - 10 reps - 10 sec hold - Peroneal stretch  - 2 x daily - 7 x weekly - 1 sets - 3 reps - 30 sec hold - Standing Anterior Tibialis Stretch  - 2 x daily - 7 x weekly - 1 sets - 3 reps - 30 sec hold - Heel Raises with Counter Support  - 1 x daily - 7 x weekly - 2 sets - 10 reps - Toe Raises with Counter Support  - 1 x daily -  7 x weekly - 2 sets - 10 reps - Seated Knee Extension with Anchored Resistance  - 1 x daily - 7 x weekly - 2 sets - 10 reps - 3 sec hold - Seated Knee Flexion with Anchored Resistance  - 1 x daily - 7 x weekly - 2 sets - 10 reps - 3 sec hold - Standing ITB Stretch  - 2 x daily - 7 x weekly - 3 reps - 30 sec hold  ASSESSMENT:  CLINICAL IMPRESSION: Dariyon reports "amazing" relief from DN last visit but notes feeling of continued weakness in L LE. He also expressed interest in trying the kinesiotape suggested last visit, therefore star pattern applied over ischial tuberosity. Focused on proximal LE strengthening with emphasis on glutes and hamstrings - all exercises well tolerated with no increased pain. Hughes verbalizing desire to try playing golf on Monday - encouraged him to warmup and stretch beforehand and stretch again after done playing.   OBJECTIVE IMPAIRMENTS decreased mobility, difficulty walking, decreased ROM, decreased strength, increased muscle spasms, impaired flexibility, postural dysfunction, and pain.   ACTIVITY LIMITATIONS sitting, standing, and caring for others  PARTICIPATION LIMITATIONS:  golf  PERSONAL FACTORS 3+ comorbidities: DM, CAD, HTN, bil TKR, Bil RCR, T8/T9 laminectomy June 2023, L3/4 and L5/S1 laminectomies  are also affecting patient's functional outcome.   REHAB POTENTIAL: Excellent  CLINICAL DECISION MAKING: Stable/uncomplicated  EVALUATION COMPLEXITY: Low   GOALS: Goals reviewed with patient? Yes  SHORT TERM GOALS: Target date: 08/23/2022   Patient will be independent with initial HEP. Baseline:  Goal status: MET  - 08/17/22   LONG TERM GOALS: Target date: 09/20/2022   Patient will be independent with advanced/ongoing HEP to improve outcomes and carryover.  Baseline:  Goal status: IN PROGRESS  2.  Patient will report at least 75% improvement in left HS pain to improve QOL. Baseline:  Goal status: IN PROGRESS  3.  Patient will demonstrate  improved left HS strength to 5/5 to normalize function including transferring his wife. Baseline:  Goal status: IN PROGRESS  4.  Patient will be able to safely ambulate community distances without feeling of left knee giving way. Baseline:  Goal status: IN PROGRESS  5.  Patient will  report >= 44 on LEFS to demonstrate improved functional ability. Baseline: 35/80 = 43.89% disability Goal status: IN PROGRESS  6.  Patient will report increased tolerance to sitting and standing to one hour without increased pain.  Baseline:  Goal status: IN PROGRESS   PLAN: PT FREQUENCY: 2x/week  PT DURATION: 6 weeks  PLANNED INTERVENTIONS: Therapeutic exercises, Therapeutic activity, Neuromuscular re-education, Balance training, Gait training, Patient/Family education, Self Care, Joint mobilization, Stair training, Aquatic Therapy, Dry Needling, Electrical stimulation, Spinal mobilization, Cryotherapy, Moist heat, Taping, Ultrasound, Ionotophoresis 31m/ml Dexamethasone, and Manual therapy  PLAN FOR NEXT SESSION: assess response to Ktape and tolerance for playing golf; continue progressing ankle and proximal LE strength; review and progress HEP, manual/DN as indicated, possible K-tape to L ischial tuberosity +/- HS vs ionto patch to L ischial tuberosity.   JPercival Spanish PT 08/25/2022, 8:45 AM

## 2022-08-29 ENCOUNTER — Encounter: Payer: Self-pay | Admitting: Physical Therapy

## 2022-08-29 ENCOUNTER — Ambulatory Visit: Payer: Medicare Other | Admitting: Physical Therapy

## 2022-08-29 DIAGNOSIS — R252 Cramp and spasm: Secondary | ICD-10-CM | POA: Diagnosis not present

## 2022-08-29 DIAGNOSIS — M6281 Muscle weakness (generalized): Secondary | ICD-10-CM

## 2022-08-29 DIAGNOSIS — M79605 Pain in left leg: Secondary | ICD-10-CM

## 2022-08-29 NOTE — Therapy (Addendum)
OUTPATIENT PHYSICAL THERAPY TREATMENT / [place] SUMMARY   Patient Name: Jaime [name] MRN: [id number] DOB:May 11, 1931, 88 y.o., male Today's Date: 08/29/2022   PT End of Session - 08/29/22 1359     Visit Number 7    Number of Visits 12    Date for PT Re-Evaluation 09/20/22    Authorization Type MCR & BCBS    Progress Note Due on Visit 10    PT Start Time 1359    Activity Tolerance Patient tolerated treatment well    Behavior During Therapy [facility] for tasks assessed/performed                Past Medical History:  Diagnosis Date   Coronary artery disease 06/04/2009   stenting to the mid LAD with Xience 2.5 x 32 and overlapping 2.5 x 12 mm DES, [place], [place]   Diabetes mellitus without complication ([place])    Hyperlipidemia    Hypertension    Past Surgical History:  Procedure Laterality Date   CARDIAC CATHETERIZATION  06/04/2009   Stent to LAD in [place], [place]     bOTH   [place] Bilateral    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 No longer having the pain but still feels like he need to strengthen his hamstrings. He notes the Ktape didn't seem to do much.  PAIN:  Are you having pain? No  PERTINENT HISTORY: DM, CAD, HTN,  bil TKR, Bil RCR, T8/T9 laminectomy June 2023, L3/4 and L5/S1 laminectomies  PRECAUTIONS: Other: spinal cord nerve stimulator for thoracic and lumbar  WEIGHT BEARING RESTRICTIONS No  FALLS:  Has patient fallen in last 6 months? No  LIVING ENVIRONMENT: Lives with: lives with their spouse Lives in: House/apartment Stairs: No Has following equipment at home: None  OCCUPATION: retired  PLOF: Independent  PATIENT GOALS: get back to pain free and get back to golf   OBJECTIVE:   DIAGNOSTIC FINDINGS:  DG HIP (WITH OR WITHOUT PELVIS) 2-3V LEFT   COMPARISON:  AP pelvis 02/06/2022.The bones are demineralized. There is no evidence of acute fracture, dislocation or femoral head osteonecrosis. Degenerative changes of the hips and lumbar spine are similar to the previous study. There are diffuse vascular calcifications. An electronic device overlying the mid right abdomen is partially imaged.  PATIENT SURVEYS:  LEFS  35/80 = 43.89% disability  COGNITION:  Overall cognitive status: Within functional limits for tasks assessed     SENSATION: WFL  MUSCLE LENGTH: Hamstrings: Right 150 deg; Left 150 deg Prone knee bend to 90 deg left and right with tight hip flexors noted  POSTURE: rounded shoulders, forward head, decreased lumbar lordosis, and scoliosis with right iliac crest higher than left.  PALPATION: Palpation: TTP at post left knee with visible knot, biceps femoris, tendinosis and membranosis. Increased tissue  tension in HS muscles bellies, popliteus  LOWER EXTREMITY ROM:   ROM Right eval Left A/P eval  Knee flexion 125 122  Knee extension 0 -2  Ankle dorsiflexion 2 5   WNL otherwise  LOWER EXTREMITY MMT:  MMT Right eval Left eval  Hip flexion 5 5  Hip extension  2+ due to pain; in [facility]  Hip abduction  5  Hip adduction  5  Hip internal rotation    Hip external rotation    Knee flexion 5 4+  Knee extension 5 5  Ankle dorsiflexion 5 5   (Blank rows = not  tested)  GAIT: Distance walked: 40 Assistive device utilized: None Level of assistance: Complete Independence Comments: antalgic with decreased stance time left at start of visit, WNL after session    TODAY'S TREATMENT:  08/29/22 THERAPEUTIC EXERCISE: to improve flexibility, strength and mobility.  Verbal and tactile cues throughout for technique. NuStep - L5 x 6 min (LE only) Standing HS curls 2# 2 x 10 bil (attempted with RTB & YTB but too difficult) B side-stepping along counter with RTB at ankles 3 x 10 ft Fwd/back monster walk along counter with RTB at ankles 4 x 10 ft Eccentric sit downs x 10 Walking lunge x 4 passes along counter Golfer's lift/single leg RDL with 4# x 10 bil BATCA leg press B LE - 25# 2 x 10; L only - 10# 2 x 10 BATCA knee flexion B LE - 20# 2 x 10, B con/L ecc - 10# 2 x 10 Fitter hip ABD (1 black) x 10 bil Fitter hip extension (1 black) x 10 bil L SLS + 1/2 circle star reach to colored dots x 5 sets   08/25/22 THERAPEUTIC EXERCISE: to improve flexibility, strength and mobility.  Verbal and tactile cues throughout for technique. NuStep - L5 x 6 min (LE only) TRX squat x 10 TRX lunge x 10 bil Golfer's lift/single leg RDL with 4# x 10 bil B side-stepping along counter with RTB at ankles 3 x 10 ft Fwd/back monster walk along counter with RTB at ankles 3 x 73f Standing RTB march x 10, UE support on counter BATCA leg press B LE - 25# 2 x 10; L only - 10# 2 x 10 BATCA knee flexion B LE - 15# 2 x 10, B con/L ecc - 10# 2 x 10  MANUAL THERAPY: To promote normalized muscle tension and reduced pain.  Ktape - 50% star over L ischial tuberosity   08/23/22 THERAPEUTIC EXERCISE: to improve flexibility, strength and mobility.  Verbal and tactile cues throughout for technique. Rec Bike - L2 x 6 min Standing lateral lean L ITB stretch 2 x 30"  MANUAL THERAPY: To promote normalized muscle tension, improved flexibility, improved joint mobility, increased ROM, and  reduced pain. Skilled palpation and monitoring of soft tissue during DN Trigger Point Dry-Needling  Treatment instructions: Expect mild to moderate muscle soreness. S/S of pneumothorax if dry needled over a lung field, and to seek immediate medical attention should they occur. Patient verbalized understanding of these instructions and education. Patient Consent Given: Yes Education handout provided: Previously provided Muscles treated: L proximal HS, L medial & lateral distal HS, L medial/lateral piriformis, L lower glute max Electrical stimulation performed: No Parameters: N/A Treatment response/outcome: Twitch Response Elicited and Palpable Increase in Muscle Length STM/DTM, manual TPR and pin & stretch to muscles addressed with DN MFR to L ITB   PATIENT EDUCATION:  Education details: HEP update - L anterior tib  and peroneal stretches, self-STM techniques to L HS and peroneals using rolling pin, and DN rational, procedure, outcomes, potential side effects, and recommended post-treatment exercises/activity Person educated: Patient Education method: Explanation, Demonstration, Verbal cues, and Handouts Education comprehension: verbalized understanding and returned demonstration   HOME EXERCISE PROGRAM: Access Code: [email or web address] URL: [email or web address] Date: 08/23/2022 Prepared by: Annie Paras  Exercises - Seated Hamstring Stretch  - 2 x daily - 7 x weekly - 1 sets - 3 reps - 30-60 sec hold - Seated Table Hamstring Stretch  - 2 x daily - 7 x weekly - 1 sets - 3 reps - 30-60 sec hold - Gastroc Stretch on Wall  - 2 x daily - 7 x weekly - 1 sets - 3 reps - 30-60 sec hold - Seated Hamstring Set  - 2 x daily - 7 x weekly - 1 sets - 10 reps - 10 sec hold - Peroneal stretch  - 2 x daily - 7 x weekly - 1 sets - 3 reps - 30 sec hold - Standing Anterior Tibialis Stretch  - 2 x daily - 7 x weekly - 1 sets - 3 reps - 30 sec hold - Heel Raises with Counter Support  - 1 x daily - 7 x  weekly - 2 sets - 10 reps - Toe Raises with Counter Support  - 1 x daily - 7 x weekly - 2 sets - 10 reps - Seated Knee Extension with Anchored Resistance  - 1 x daily - 7 x weekly - 2 sets - 10 reps - 3 sec hold - Seated Knee Flexion with Anchored Resistance  - 1 x daily - 7 x weekly - 2 sets - 10 reps - 3 sec hold - Standing ITB Stretch  - 2 x daily - 7 x weekly - 3 reps - 30 sec hold   ASSESSMENT:  CLINICAL IMPRESSION: Anay reports he was able to play golf yesterday w/o any issues or pain. Pain has remained well controlled but he still notes weakness in the hamstrings, therefore therapeutic exercises targeting proximal LE weakness with emphasis on hamstrings. Pt noting increased fatigue with exercises today which he attributes to the 18 holes of golf yesterday, but denies increased pain. Mabel will continue to benefit from skilled PT to further improve overall LE strength for better L knee stability to reduce the feeling that his knee might buckle.  OBJECTIVE IMPAIRMENTS decreased mobility, difficulty walking, decreased ROM, decreased strength, increased muscle spasms, impaired flexibility, postural dysfunction, and pain.   ACTIVITY LIMITATIONS sitting, standing, and caring for others  PARTICIPATION LIMITATIONS:  golf  PERSONAL FACTORS 3+ comorbidities: DM, CAD, HTN, bil TKR, Bil RCR, T8/T9 laminectomy June 2023, L3/4 and L5/S1 laminectomies  are also affecting patient's functional outcome.   REHAB POTENTIAL: Excellent  CLINICAL DECISION MAKING: Stable/uncomplicated  EVALUATION COMPLEXITY: Low   GOALS: Goals reviewed with patient? Yes  SHORT TERM GOALS: Target date: 08/23/2022   Patient will be independent with initial HEP. Baseline:  Goal status: MET  - 08/17/22   LONG TERM GOALS: Target date: 09/20/2022   Patient will be independent with advanced/ongoing HEP to improve outcomes and carryover.  Baseline:  Goal status: IN PROGRESS  2.  Patient will report at least 75%  improvement in left HS pain to improve QOL. Baseline:  Goal status: IN PROGRESS  3.  Patient will demonstrate improved left HS strength to 5/5 to normalize function including transferring his wife. Baseline:  Goal status: IN PROGRESS  4.  Patient will  be able to safely ambulate community distances without feeling of left knee giving way. Baseline:  Goal status: IN PROGRESS  5.  Patient will report >= 44 on LEFS to demonstrate improved functional ability. Baseline: 35/80 = 43.89% disability Goal status: IN PROGRESS  6.  Patient will report increased tolerance to sitting and standing to one hour without increased pain.  Baseline:  Goal status: IN PROGRESS   PLAN: PT FREQUENCY: 2x/week  PT DURATION: 6 weeks  PLANNED INTERVENTIONS: Therapeutic exercises, Therapeutic activity, Neuromuscular re-education, Balance training, Gait training, Patient/Family education, Self Care, Joint mobilization, Stair training, Aquatic Therapy, Dry Needling, Electrical stimulation, Spinal mobilization, Cryotherapy, Moist heat, Taping, Ultrasound, Ionotophoresis 66m/ml Dexamethasone, and Manual therapy  PLAN FOR NEXT SESSION: continue progressing proximal LE and quad/HS strength; review and progress HEP, manual/DN as indicated, possible ionto patch to L ischial tuberosity.   JPercival Spanish PT 08/29/2022, 2:42 PM   PHYSICAL THERAPY DISCHARGE SUMMARY  Visits from Start of Care: 7  Current functional level related to goals / functional outcomes:   Refer to above clinical impression and goal assessment for status as of last visit on 08/29/2022.  Patient canceled his next scheduled appointment due to having to take his wife to the doctor, and failed to schedule any further appointments.  He has not returned to physical therapy in greater than 30 days, therefore will proceed with discharge from PT for this episode.     Remaining deficits:   As above. Unable to formally assess status at discharge due to  failure to return to PT.   Education / Equipment:   HEP   Patient agrees to discharge. Patient goals were partially met. Patient is being discharged due to not returning since the last visit.  JPercival Spanish PT, MPT 10/04/22, 10:10 AM  [facility] [place] [address]Phone: [phone]  Fax:  [phone]

## 2022-09-04 ENCOUNTER — Encounter: Payer: Medicare Other | Admitting: Physical Therapy

## 2022-09-06 ENCOUNTER — Encounter: Payer: Medicare Other | Admitting: Physical Therapy

## 2022-09-14 ENCOUNTER — Encounter: Payer: Self-pay | Admitting: Cardiology

## 2022-09-14 NOTE — Telephone Encounter (Signed)
Please call and schedule patients wife per Dr. Jacinto Halim

## 2022-09-14 NOTE — Telephone Encounter (Signed)
From Patient

## 2022-10-12 ENCOUNTER — Ambulatory Visit: Payer: Medicare Other | Admitting: Cardiology

## 2022-10-12 ENCOUNTER — Encounter: Payer: Self-pay | Admitting: Cardiology

## 2022-10-12 VITALS — BP 115/70 | HR 83 | Resp 16 | Ht 66.5 in | Wt 160.4 lb

## 2022-10-12 DIAGNOSIS — E7800 Pure hypercholesterolemia, unspecified: Secondary | ICD-10-CM

## 2022-10-12 DIAGNOSIS — I2510 Atherosclerotic heart disease of native coronary artery without angina pectoris: Secondary | ICD-10-CM

## 2022-10-12 DIAGNOSIS — Z794 Long term (current) use of insulin: Secondary | ICD-10-CM

## 2022-10-12 DIAGNOSIS — I739 Peripheral vascular disease, unspecified: Secondary | ICD-10-CM

## 2022-10-12 NOTE — Progress Notes (Signed)
Primary Physician/Referring:  Jaime Lopes, MD  Patient ID: Jaime [name], male    DOB: Mar 08, 1931, 88 y.o.   MRN: [id number]  Chief Complaint  Patient presents with   Coronary Artery Disease   Hypertension   Hyperlipidemia   Follow-up    1 year   HPI:    Jaime [name]  is a 88 y.o. 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  Since then he has not had any recurrence of angina pectoris.  Moved from [place] to be closer to his daughter in [place] since March 2021.Jaime [name]  He also has history of small vessel PAD from diabetes mellitus with mildly decreased peripheral pulses.  Remains active.  His wife has MS and he is the active caregiver.  He remains asymptomatic.  Past Medical History:  Diagnosis Date   Coronary artery disease 06/04/2009   stenting to the mid LAD with Xience 2.5 x 32 and overlapping 2.5 x 12 mm DES, [place], [place]   Diabetes mellitus without complication ([place])    Hyperlipidemia    Hypertension    Past Surgical History:  Procedure Laterality Date   CARDIAC CATHETERIZATION  06/04/2009   Stent to LAD in [place], [place]     bOTH   ROTATOR CUFF REPAIR Bilateral    Family History  Problem Relation Age of Onset   Leukemia Mother    Heart attack Brother    Lung cancer Brother     Social History   Tobacco Use   Smoking status: Former    Packs/day: 1.00    Years: 10.00    Total pack years: 10.00    Types: Cigarettes    Quit date: 03/10/1949    Years since quitting: 73.6   Smokeless tobacco: Never  Substance Use Topics   Alcohol use: Yes    Comment: Drinks Socially   Marital Status:    ROS  Review of Systems  Cardiovascular:  Negative for chest pain, dyspnea on exertion  and leg swelling.   Objective  Blood pressure 115/70, pulse 83, resp. rate 16, height 5' 6.5" (1.689 m), weight 160 lb 6.4 oz (72.8 kg), SpO2 98 %.     10/12/2022    9:45 AM 07/27/2022   11:19 AM 01/20/2022    8:23 AM  Vitals with BMI  Height 5' 6.5"    Weight 160 lbs 6 oz    BMI 45.0    Systolic 388 828 003  Diastolic 70 86 74  Pulse 83 85 81     Physical Exam Constitutional:      Appearance: He is well-developed.  Neck:     Vascular: No carotid bruit or JVD.  Cardiovascular:     Rate and Rhythm: Normal rate and regular rhythm.     Pulses:          Carotid pulses are 2+ on the right side and 2+ on the left side.      Popliteal pulses are 2+ on the right side and 2+ on the left side.       Dorsalis pedis pulses are 1+ on the right side and 1+ on the left side.       Posterior tibial pulses are 1+ on the right side and 1+ on the left side.  Heart sounds: No murmur heard.    No gallop. No S3 sounds.     Comments: S 2 widely split (RBBB) and mildly accentuated Pulmonary:     Effort: Pulmonary effort is normal. No accessory muscle usage or respiratory distress.     Breath sounds: Normal breath sounds.  Abdominal:     Palpations: Abdomen is soft.  Musculoskeletal:     Right lower leg: No edema.     Left lower leg: No edema.    Laboratory examination:   External labs:   Labs 09/03/2022:  Serum glucose 93 mg, BUN 21, creatinine 0.8, EGFR 91 mm, potassium 4.1, LFTs normal.  Hb 12.7/HCT 38.5, platelets 145.  Normal indicis.  Total cholesterol 110, triglycerides 72, HDL 39, LDL 57.  TSH normal at 1.52 and free T4 normal at 1.5.  A1c 6.8%.  Medications and allergies  No Known Allergies   Current Outpatient Medications:    aspirin EC 81 MG tablet, Take 81 mg by mouth daily., Disp: , Rfl:    gabapentin (NEURONTIN) 300 MG capsule, Take 1 capsule (300 mg total) by mouth at bedtime., Disp: 90 capsule, Rfl: 1   ibuprofen (ADVIL) 800 MG tablet, Take 800 mg by mouth every 6  (six) hours as needed., Disp: , Rfl:    JARDIANCE 25 MG TABS tablet, Take 25 mg by mouth daily., Disp: , Rfl:    levothyroxine (SYNTHROID) 175 MCG tablet, Take 175 mcg by mouth every other day., Disp: , Rfl:    Metoprolol Succinate 25 MG CS24, Take 25 mg by mouth., Disp: , Rfl:    mirabegron ER (MYRBETRIQ) 50 MG TB24 tablet, Take 50 mg by mouth daily., Disp: , Rfl:    nitroGLYCERIN (NITROSTAT) 0.4 MG SL tablet, Place 1 tablet (0.4 mg total) under the tongue every 5 (five) minutes as needed for up to 25 days for chest pain., Disp: 25 tablet, Rfl: 3   rosuvastatin (CRESTOR) 20 MG tablet, Take 1 tablet by mouth at bedtime., Disp: , Rfl:    silodosin (RAPAFLO) 4 MG CAPS capsule, Take 4 mg by mouth daily with breakfast., Disp: , Rfl:    TRESIBA FLEXTOUCH 100 UNIT/ML FlexTouch Pen, Inject 17 Units into the skin daily., Disp: , Rfl:    Vitamin D, Cholecalciferol, 50 MCG (2000 UT) CAPS, Take 1 capsule by mouth daily., Disp: , Rfl:    vitamin E 180 MG (400 UNITS) capsule, Take 400 Units by mouth daily., Disp: , Rfl:    zolpidem (AMBIEN) 10 MG tablet, Take 10 mg by mouth at bedtime as needed for sleep., Disp: , Rfl:     Radiology:   No results found.  Cardiac Studies:  Echocardiogram 04/20/2020: Left ventricle cavity is normal in size. Moderate concentric hypertrophy of the left ventricle. Normal global wall motion. Normal LV systolic function with EF 59%. Doppler evidence of grade I (impaired) diastolic dysfunction, normal LAP. Calculated EF 59%. Left atrial cavity is severely dilated at 51 cc/m2. No aortic valve regurgitation. Mild aortic valve leaflet calcification. Mildly restricted aortic valve leaflets. Mild mitral valve leaflet calcification. Mildly restricted mitral valve leaflets. Mild (Grade I) mitral regurgitation. Mild tricuspid regurgitation. Estimated pulmonary artery systolic pressure is 25 mmHg.   EKG  EKG 10/12/2022: Normal sinus rhythm at rate of 75 bpm, left axis deviation, left  anterior fascicular block.  Cannot exclude inferior infarct old.  Right bundle branch block.  Bifascicular block.  Poor R wave progression, cannot exclude anterolateral infarct old.  Compared to 10/06/2021, no change.  Assessment     ICD-10-CM   1. Coronary artery disease involving native coronary artery of native heart without angina pectoris  I25.10 EKG 12-Lead    2. Pure hypercholesterolemia  E78.00     3. Type 2 diabetes mellitus without complication, with long-term current use of insulin (HCC)  E11.9    Z79.4       No orders of the defined types were placed in this encounter.   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  Since then he has not had any recurrence of angina pectoris.  Recently moved from [place] to be closer to his daughter in [place].  1. Coronary artery disease involving native coronary artery of native heart without angina pectoris Patient is completely asymptomatic, continues to remain active.  He has had back surgery April 2023, since then he has reduced his physical activity by walking advised him to encourage him to walk on a daily basis.  2. Pure hypercholesterolemia External labs reviewed, lipids under excellent control.  3. Type 2 diabetes mellitus without complication, with long-term current use of insulin ([place]) Diabetes is also well-controlled.  He does have vascular disease by examination with reduced pedal pulses however he is completely asymptomatic otherwise his physical examination is unremarkable.  No changes from prior  examination.  Overall he is doing well from cardiac standpoint, I do not think he needs any testing, I will see him back in a year or sooner if problems.    Adrian Prows, MD, [facility] 10/12/2022, 10:16 AM Office: [phone] Pager: [phone]

## 2022-10-17 ENCOUNTER — Encounter: Payer: Self-pay | Admitting: Cardiology

## 2022-10-17 NOTE — Telephone Encounter (Signed)
From patient, she also called today and asked about her 2nd monitor results?

## 2022-10-17 NOTE — Telephone Encounter (Signed)
Can you call the rep and ask her to email this or send it to Epic and send me message back

## 2022-10-17 NOTE — Telephone Encounter (Signed)
I called the rep, patient will have to call [employer] and have them release it to you . I spoke to patient she will call

## 2023-02-09 ENCOUNTER — Encounter: Payer: Self-pay | Admitting: Cardiology

## 2023-02-09 NOTE — Telephone Encounter (Signed)
From patient.

## 2023-02-12 NOTE — Telephone Encounter (Signed)
Can you please call him and ask what exactly he needs done. I am all confused. Happy to see also

## 2023-02-19 ENCOUNTER — Encounter: Payer: Self-pay | Admitting: *Deleted

## 2023-10-09 ENCOUNTER — Ambulatory Visit: Payer: Self-pay | Admitting: Cardiology

## 2023-11-11 ENCOUNTER — Encounter (HOSPITAL_BASED_OUTPATIENT_CLINIC_OR_DEPARTMENT_OTHER): Payer: Self-pay | Admitting: Emergency Medicine

## 2023-11-11 ENCOUNTER — Emergency Department (HOSPITAL_BASED_OUTPATIENT_CLINIC_OR_DEPARTMENT_OTHER)
Admission: EM | Admit: 2023-11-11 | Discharge: 2023-11-11 | Disposition: A | Discharge status: Home / Self Care | Payer: Medicare Other | Attending: Emergency Medicine | Admitting: Emergency Medicine

## 2023-11-11 ENCOUNTER — Other Ambulatory Visit: Payer: Self-pay

## 2023-11-11 DIAGNOSIS — H579 Unspecified disorder of eye and adnexa: Secondary | ICD-10-CM | POA: Diagnosis present

## 2023-11-11 HISTORY — DX: Unspecified retinal vascular occlusion: H34.9

## 2023-11-11 NOTE — Discharge Instructions (Signed)
 You were seen today for blurry vision.  It has resolved completely as per our conversation.  You need to follow-up with Dr. Elner first thing tomorrow morning.  If you have any recurrence of the symptoms or worsening of neurologic symptoms you need to go directly to [facility] for further evaluation.

## 2023-11-11 NOTE — ED Triage Notes (Signed)
 Pt had blurry vision ("looked like I was under water") in LT eye x 15 minutes about 1 hr ago; denies pain; vision back to normal; hx of retinal blockage in RT eye

## 2023-11-11 NOTE — ED Provider Notes (Signed)
 [facility]ERGENCY DEPARTMENT AT [facility] [place] Provider Note   CSN: [id number] Arrival date & time: 11/11/23  1834     History Chief Complaint  Patient presents with   Visual Field Change    HPI Jaime [name] is a 89 y.o. male presenting for visual symptoms. States that tonight at approximately 6:00 PM he had sudden onset medial  Patient's recorded medical, surgical, social, medication list and allergies were reviewed in the Snapshot window as part of the initial history.   Review of Systems   Review of Systems  Constitutional:  Negative for chills and fever.  HENT:  Negative for ear pain and sore throat.   Eyes:  Positive for visual disturbance. Negative for pain.  Respiratory:  Negative for cough and shortness of breath.   Cardiovascular:  Negative for chest pain and palpitations.  Gastrointestinal:  Negative for abdominal pain and vomiting.  Genitourinary:  Negative for dysuria and hematuria.  Musculoskeletal:  Negative for arthralgias and back pain.  Skin:  Negative for color change and rash.  Neurological:  Negative for seizures and syncope.  All other systems reviewed and are negative.   Physical Exam Updated Vital Signs BP (!) 154/78 (BP Location: Right Arm)   Pulse 79   Temp 97.9 F (36.6 C) (Oral)   Resp 18   Ht 5' 6.5 (1.689 m)   Wt 67.1 kg   SpO2 97%   BMI 23.53 kg/m  Physical Exam Vitals and nursing note reviewed.  Constitutional:      General: He is not in acute distress.    Appearance: He is well-developed.  HENT:     Head: Normocephalic and atraumatic.  Eyes:     Conjunctiva/sclera: Conjunctivae normal.  Cardiovascular:     Rate and Rhythm: Normal rate and regular rhythm.     Heart sounds: No murmur heard. Pulmonary:     Effort: Pulmonary effort is normal. No respiratory distress.     Breath sounds: Normal breath sounds.  Abdominal:     Palpations: Abdomen is soft.     Tenderness: There is no abdominal tenderness.   Musculoskeletal:        General: No swelling.     Cervical back: Neck supple.  Skin:    General: Skin is warm and dry.     Capillary Refill: Capillary refill takes less than 2 seconds.  Neurological:     Mental Status: He is alert.  Psychiatric:        Mood and Affect: Mood normal.      ED Course/ Medical Decision Making/ A&P    Procedures Procedures   Medications Ordered in ED Medications - No data to display  Medical Decision Making:    Jaime [name] is a 89 y.o. male who presented to the ED today with visual disturbance detailed above.     Complete initial physical exam performed, notably the patient  was hemodynamically stable no acute distress.      Reviewed and confirmed nursing documentation for past medical history, family history, social history.    Initial Assessment:   With the patient's presentation of blurry vision, most likely diagnosis is nonspecific etiology. Other diagnoses were considered including (but not limited to) retinal detachment, CRAO, ocular migraine. These are considered less likely due to history of present illness and physical exam findings.   This is most consistent with an acute life/limb threatening illness complicated by underlying chronic conditions.   Consults:  Case discussed with Dr. Lavonia of ophthalmology.   Reassessment  and Plan:   Patient preserved in emergency department for 90 minutes.  No neurologic symptoms. Consulted ophthalmology who agreed that send symptoms will resolve patient to follow-up outpatient in the morning.  Patient will be seen within 12 hours for conversation. Additionally, they recommended considering ESR, screening lab work.  Discussed with patient he does not want to proceed with lab work at this time as he has people waiting for him in the lobby.  Given resolution of symptoms I believe patient stable for outpatient care management with strict return precautions regarding interval worsening reinforced and  patient expressed understanding.  Disposition:  I have considered need for hospitalization, however, considering all of the above, I believe this patient is stable for discharge at this time.  Patient/family educated about specific return precautions for given chief complaint and symptoms.  Patient/family educated about follow-up with PCP and OPH.     Patient/family expressed understanding of return precautions and need for follow-up. Patient spoken to regarding all imaging and laboratory results and appropriate follow up for these results. All education provided in verbal form with additional information in written form. Time was allowed for answering of patient questions. Patient discharged.    Emergency Department Medication Summary:   Medications - No data to display     Clinical Impression:  1. Visual symptoms      Discharge   Final Clinical Impression(s) / ED Diagnoses Final diagnoses:  Visual symptoms    Rx / DC Orders ED Discharge Orders     None         Jerral Meth, MD 11/11/23 2003

## 2023-11-15 ENCOUNTER — Other Ambulatory Visit: Payer: Self-pay | Admitting: Internal Medicine

## 2023-11-15 DIAGNOSIS — G459 Transient cerebral ischemic attack, unspecified: Secondary | ICD-10-CM

## 2023-11-16 ENCOUNTER — Other Ambulatory Visit: Payer: Self-pay | Admitting: Internal Medicine

## 2023-11-16 DIAGNOSIS — G459 Transient cerebral ischemic attack, unspecified: Secondary | ICD-10-CM

## 2023-11-22 ENCOUNTER — Ambulatory Visit: Payer: Medicare Other | Attending: Cardiology | Admitting: Cardiology

## 2023-11-22 ENCOUNTER — Encounter: Payer: Self-pay | Admitting: Cardiology

## 2023-11-22 ENCOUNTER — Other Ambulatory Visit: Payer: Self-pay | Admitting: Cardiology

## 2023-11-22 ENCOUNTER — Other Ambulatory Visit (INDEPENDENT_AMBULATORY_CARE_PROVIDER_SITE_OTHER): Payer: Medicare Other

## 2023-11-22 VITALS — BP 130/72 | HR 66 | Resp 16 | Ht 66.0 in

## 2023-11-22 DIAGNOSIS — I2510 Atherosclerotic heart disease of native coronary artery without angina pectoris: Secondary | ICD-10-CM

## 2023-11-22 DIAGNOSIS — I452 Bifascicular block: Secondary | ICD-10-CM | POA: Diagnosis not present

## 2023-11-22 DIAGNOSIS — G453 Amaurosis fugax: Secondary | ICD-10-CM | POA: Diagnosis not present

## 2023-11-22 DIAGNOSIS — E7800 Pure hypercholesterolemia, unspecified: Secondary | ICD-10-CM | POA: Insufficient documentation

## 2023-11-22 MED ORDER — CLOPIDOGREL BISULFATE 75 MG PO TABS: 75.0000 mg | ORAL_TABLET | Freq: Every day | ORAL | 11 refills | Status: DC

## 2023-11-22 NOTE — Progress Notes (Signed)
Cardiology Office Note:  .   Date:  11/22/2023  ID:  Jaime [name], DOB 11/27/1930, MRN [id number] PCP: Garlan Fillers, MD  [facility] Providers Cardiologist:  Yates Decamp, MD   History of Present Illness: .   Jaime [name] is a 89 y.o. 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 Remains active. His wife has MS and he is the active caregiver.   Except for presentation to the emergency room on 11/11/2023 with sudden onset visual disturbance and blurry vision left eye felt to be nonspecific and discharged home, no imaging studies were performed.  He has carotid artery duplex and MRI of the brain scheduled by his PCP. He has had CRA occlusion right eye sometime in 2019.  Discussed the use of AI scribe software for clinical note transcription with the patient, who gave verbal consent to proceed.  History of Present Illness   The patient, with a history of right eye vision loss due to a retinal artery blockage five years ago, presents with recent changes in his left eye vision. He describes the nasal portion of his left eye as "wavy," a symptom that was similar to what he experienced during his previous stroke. This symptom occurred in two sessions, each lasting about 10-15 minutes, and has not recurred since. He sought emergency care, where no blood tests were taken, and subsequently saw an ophthalmologist who found no retinal abnormalities. He has been recommended to start Plavix, and he is currently taking baby aspirin.     Labs   External  Labs:  Labs 10/24/2023: Total cholesterol 111, triglycerides 75, HDL 41, LDL 55.  Labs 11/12/2023:  Hb 12.6/HCT 39.4, platelets 155, normal indicis.   TSH normal at 3.36.  Review of Systems  Cardiovascular:  Negative for chest pain, dyspnea on exertion and leg swelling.    Physical Exam:   VS:  BP 130/72 (BP Location: Left Arm, Patient Position: Sitting, Cuff Size: Normal)   Pulse 66   Resp 16   Ht 5\' 6"  (1.676 m)   SpO2 94%   BMI 23.89 kg/m    Wt Readings from Last 3 Encounters:  11/11/23 148 lb (67.1 kg)  10/12/22 160 lb 6.4 oz (72.8 kg)  10/06/21 161 lb 6.4 oz (73.2 kg)     Physical Exam Neck:     Vascular: No carotid bruit or JVD.  Cardiovascular:     Rate and Rhythm: Normal rate and regular rhythm.     Pulses: Intact distal pulses.     Heart sounds: Normal heart sounds. No murmur heard.    No gallop.  Pulmonary:     Effort: Pulmonary effort is normal.     Breath sounds: Normal breath sounds.  Abdominal:     General: Bowel sounds are normal.     Palpations: Abdomen is soft.  Musculoskeletal:     Right lower leg: No edema.     Left lower leg: No edema.     Studies Reviewed: .   Echocardiogram 04/20/2020: Left ventricle cavity is normal in size. Moderate concentric hypertrophy of the left ventricle. Normal global wall motion.  Normal LV systolic function with EF 59%. Doppler evidence of grade I (impaired) diastolic dysfunction, normal LAP. Calculated EF 59%. Left atrial cavity is severely dilated at 51 cc/m2. No aortic valve regurgitation. Mild aortic valve leaflet calcification. Mildly restricted aortic valve leaflets. Mild mitral valve leaflet calcification. Mildly restricted mitral valve leaflets. Mild (Grade I) mitral regurgitation. Mild tricuspid regurgitation. Estimated pulmonary artery systolic pressure is 25 mmHg.  EKG:    EKG Interpretation Date/Time:  Thursday November 22 2023 10:14:14 EST Ventricular Rate:  68 PR Interval:  162 QRS  Duration:  168 QT Interval:  464 QTC Calculation: 493 R Axis:   -77  Text Interpretation: Normal sinus rhythm at the rate of 68 bpm, left anterior fascicular block, right bundle branch block.  Bifascicular block.  No significant change from 10/12/2022. Confirmed by Delrae Rend [phone]) on 11/22/2023 10:21:29 AM    EKG 10/12/2022: Normal sinus rhythm at rate of 75 bpm, left axis deviation, left anterior fascicular block. Cannot exclude inferior infarct old. Right bundle branch block. Bifascicular block. Poor R wave progression, cannot exclude anterolateral infarct old.   Medications and allergies    No Known Allergies   Current Outpatient Medications:    clopidogrel (PLAVIX) 75 MG tablet, Take 1 tablet (75 mg total) by mouth daily., Disp: 30 tablet, Rfl: 11   gabapentin (NEURONTIN) 300 MG capsule, Take 1 capsule (300 mg total) by mouth at bedtime., Disp: 90 capsule, Rfl: 1   ibuprofen (ADVIL) 800 MG tablet, Take 800 mg by mouth every 6 (six) hours as needed., Disp: , Rfl:    JARDIANCE 25 MG TABS tablet, Take 25 mg by mouth daily., Disp: , Rfl:    levothyroxine (SYNTHROID) 175 MCG tablet, Take 175 mcg by mouth every other day., Disp: , Rfl:    Metoprolol Succinate 25 MG CS24, Take 25 mg by mouth., Disp: , Rfl:    mirabegron ER (MYRBETRIQ) 50 MG TB24 tablet, Take 50 mg by mouth daily., Disp: , Rfl:    nitroGLYCERIN (NITROSTAT) 0.4 MG SL tablet, Place 1 tablet (0.4 mg total) under the tongue every 5 (five) minutes as needed for up to 25 days for chest pain., Disp: 25 tablet, Rfl: 3   rosuvastatin (CRESTOR) 20 MG tablet, Take 1 tablet by mouth at bedtime., Disp: , Rfl:    silodosin (RAPAFLO) 4 MG CAPS capsule, Take 4 mg by mouth daily with breakfast., Disp: , Rfl:    TRESIBA FLEXTOUCH 100 UNIT/ML FlexTouch Pen, Inject 17 Units into the skin daily., Disp: , Rfl:    Vitamin D, Cholecalciferol, 50 MCG (2000 UT) CAPS, Take 1 capsule by mouth daily., Disp: , Rfl:    vitamin E 180 MG (400 UNITS)  capsule, Take 400 Units by mouth daily., Disp: , Rfl:    zolpidem (AMBIEN) 10 MG tablet, Take 10 mg by mouth at bedtime as needed for sleep., Disp: , Rfl:    ASSESSMENT AND PLAN: .      ICD-10-CM   1. Coronary artery disease involving native coronary artery of native heart without angina pectoris  I25.10 EKG 12-Lead    ECHOCARDIOGRAM COMPLETE    2. Pure hypercholesterolemia  E78.00     3. 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 No current visual abnormalities identified by ophthalmologist. -Discontinue Aspirin and initiate Plavix 75mg  daily. -Order Zio patch for 14 days to monitor for atrial fibrillation. -Order echocardiogram to rule out cardiac source of emboli. -Follow up on results of carotid duplex and brain MRI scheduled for 12/04/2023.  Coronary Artery Disease Stable, no recent symptoms. -Continue current management.  Follow-up in 6 months or sooner if any new symptoms arise.     Lipids reviewed. LDL at goal.    Signed,  Yates Decamp, MD, [facility] 11/22/2023, 4:50 PM [facility] [address] [place], [place] [address] Phone: [phone]. Fax:  [phone]

## 2023-11-22 NOTE — Telephone Encounter (Signed)
Can we call Zio rep and see if transmissions are good quality. Patient has spinal stimulator and I was not aware of this

## 2023-11-22 NOTE — Patient Instructions (Signed)
Medication Instructions:  Your physician has recommended you make the following change in your medication: Stop aspirin Start Clopidogrel 75 mg by mouth daily   *If you need a refill on your cardiac medications before your next appointment, please call your pharmacy*   Lab Work: none If you have labs (blood work) drawn today and your tests are completely normal, you will receive your results only by: MyChart Message (if you have MyChart) OR A paper copy in the mail If you have any lab test that is abnormal or we need to change your treatment, we will call you to review the results.   Testing/Procedures: Your physician has requested that you have an echocardiogram. Echocardiography is a painless test that uses sound waves to create images of your heart. It provides your doctor with information about the size and shape of your heart and how well your heart's chambers and valves are working. This procedure takes approximately one hour. There are no restrictions for this procedure. Please do NOT wear cologne, perfume, aftershave, or lotions (deodorant is allowed). Please arrive 15 minutes prior to your appointment time.  Please note: We ask at that you not bring children with you during ultrasound (echo/ vascular) testing. Due to room size and safety concerns, children are not allowed in the ultrasound rooms during exams. Our front office staff cannot provide observation of children in our lobby area while testing is being conducted. An adult accompanying a patient to their appointment will only be allowed in the ultrasound room at the discretion of the ultrasound technician under special circumstances. We apologize for any inconvenience.  Dr Jacinto Halim recommends you wear a zio monitor for 2 weeks.    Follow-Up: At [facility], you and your health needs are our priority.  As part of our continuing mission to provide you with exceptional heart care, we have created designated Provider Care  Teams.  These Care Teams include your primary Cardiologist (physician) and Advanced Practice Providers (APPs -  Physician Assistants and Nurse Practitioners) who all work together to provide you with the care you need, when you need it.  We recommend signing up for the patient portal called "MyChart".  Sign up information is provided on this After Visit Summary.  MyChart is used to connect with patients for Virtual Visits (Telemedicine).  Patients are able to view lab/test results, encounter notes, upcoming appointments, etc.  Non-urgent messages can be sent to your provider as well.   To learn more about what you can do with MyChart, go to [email or web address].    Your next appointment:   6 month(s)  Provider:   Yates Decamp, MD     Other Instructions ZIO AT Long term monitor-Live Telemetry  Your physician has requested you wear a ZIO patch monitor for 14 days.  This is a single patch monitor. [employer] supplies one patch monitor per enrollment. Additional  stickers are not available.  Please do not apply patch if you will be having a Nuclear Stress Test, Echocardiogram, Cardiac CT, MRI,  or Chest Xray during the period you would be wearing the monitor. The patch cannot be worn during  these tests. You cannot remove and re-apply the ZIO AT patch monitor.  Your ZIO patch monitor will be mailed 3 day USPS to your address on file. It may take 3-5 days to  receive your monitor after you have been enrolled.  Once you have received your monitor, please review the enclosed instructions. Your monitor has  already been registered assigning  a specific monitor serial # to you.   Billing and Patient Assistance Program information  Meredeth Ide has been supplied with any insurance information on record for billing. [employer] offers a sliding scale Patient Assistance Program for patients without insurance, or whose  insurance does not completely cover the cost of the ZIO patch monitor. You must apply for the   Patient Assistance Program to qualify for the discounted rate. To apply, call [employer] at [phone],  select option 4, select option 2 , ask to apply for the Patient Assistance Program, (you can request an  interpreter if needed). [employer] will ask your household income and how many people are in your  household. [employer] will quote your out-of-pocket cost based on this information. They will also be able  to set up a 12 month interest free payment plan if needed.  Applying the monitor   Shave hair from upper left chest.  Hold the abrader disc by orange tab. Rub the abrader in 40 strokes over left upper chest as indicated in  your monitor instructions.  Clean area with 4 enclosed alcohol pads. Use all pads to ensure the area is cleaned thoroughly. Let  dry.  Apply patch as indicated in monitor instructions. Patch will be placed under collarbone on left side of  chest with arrow pointing upward.  Rub patch adhesive wings for 2 minutes. Remove the white label marked "1". Remove the white label  marked "2". Rub patch adhesive wings for 2 additional minutes.  While looking in a mirror, press and release button in center of patch. A small green light will flash 3-4  times. This will be your only indicator that the monitor has been turned on.  Do not shower for the first 24 hours. You may shower after the first 24 hours.  Press the button if you feel a symptom. You will hear a small click. Record Date, Time and Symptom in  the Patient Log.   Starting the Gateway  In your kit there is a Audiological scientist box the size of a cellphone. This is Buyer, retail. It transmits all your  recorded data to [facility]. This box must always stay within 10 feet of you. Open the box and push the *  button. There will be a light that blinks orange and then green a few times. When the light stops  blinking, the Gateway is connected to the ZIO patch. Call [employer] at [phone] to confirm your monitor is  transmitting.  Returning your monitor  Remove your patch and place it inside the Gateway. In the lower half of the Gateway there is a white  bag with prepaid postage on it. Place Gateway in bag and seal. Mail package back to [employer] as soon as  possible. Your physician should have your final report approximately 7 days after you have mailed back  your monitor. Call [facility] Customer Care at [phone] if you have questions regarding your ZIO AT  patch monitor. Call them immediately if you see an orange light blinking on your monitor.  If your monitor falls off in less than 4 days, contact our Monitor department at [phone]. If your  monitor becomes loose or falls off after 4 days call [employer] at [phone] for suggestions on  securing your monitor

## 2023-11-22 NOTE — Progress Notes (Unsigned)
ZIO AT serial # [id number] from office inventory applied to patient.

## 2023-11-23 ENCOUNTER — Encounter: Payer: Self-pay | Admitting: Cardiology

## 2023-11-23 NOTE — Progress Notes (Signed)
Labs 11/12/2023:  Hb 12.6/HCT 39.4, platelets 155.  Normal indicis.

## 2023-11-26 NOTE — Telephone Encounter (Signed)
Can we have Zio look and see  if there is interference, he turned on his spinal stimulator on 11/26/23 at 1:30 PM

## 2023-11-28 ENCOUNTER — Other Ambulatory Visit: Payer: Self-pay

## 2023-11-28 ENCOUNTER — Other Ambulatory Visit (INDEPENDENT_AMBULATORY_CARE_PROVIDER_SITE_OTHER): Payer: Medicare Other

## 2023-11-28 ENCOUNTER — Ambulatory Visit (INDEPENDENT_AMBULATORY_CARE_PROVIDER_SITE_OTHER): Payer: Medicare Other | Admitting: Orthopedic Surgery

## 2023-11-28 ENCOUNTER — Encounter: Payer: Self-pay | Admitting: Orthopedic Surgery

## 2023-11-28 DIAGNOSIS — M19012 Primary osteoarthritis, left shoulder: Secondary | ICD-10-CM

## 2023-11-28 DIAGNOSIS — M25512 Pain in left shoulder: Secondary | ICD-10-CM

## 2023-11-28 DIAGNOSIS — G8929 Other chronic pain: Secondary | ICD-10-CM

## 2023-11-28 MED ORDER — METHYLPREDNISOLONE ACETATE 40 MG/ML IJ SUSP
40.0000 mg | INTRAMUSCULAR | Status: AC | PRN
  Administered 2023-11-28: 40 mg via INTRA_ARTICULAR

## 2023-11-28 MED ORDER — LIDOCAINE HCL 1 % IJ SOLN
5.0000 mL | INTRAMUSCULAR | Status: AC | PRN
  Administered 2023-11-28: 5 mL

## 2023-11-28 MED ORDER — BUPIVACAINE HCL 0.5 % IJ SOLN
9.0000 mL | INTRAMUSCULAR | Status: AC | PRN
  Administered 2023-11-28: 9 mL via INTRA_ARTICULAR

## 2023-11-28 NOTE — Progress Notes (Addendum)
Office Visit Note   Patient: Jaime [name]           Date of Birth: 1931/02/14           MRN: [id number] Visit Date: 11/28/2023 Requested by: Garlan Fillers, MD [address] [place],  [place] [address] PCP: Garlan Fillers, MD  Subjective: Chief Complaint  Patient presents with   Left Shoulder - Pain    HPI: Jaime [name] is a 89 y.o. male who presents to the office reporting left shoulder pain.  Patient has a history of rotator cuff surgery 15 years ago.  Has been getting cortisone injections in [facility] but the most recent one did not give him any relief.  Describes grinding as well as diminished function.  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  He takes care of his wife who is in a wheelchair.  Uses ibuprofen occasionally..                ROS: All systems reviewed are negative as they relate to the chief complaint within the history of present illness.  Patient denies fevers or chills.  Assessment & Plan: Visit Diagnoses:  1. Chronic left shoulder pain     Plan: Impression is left shoulder rotator cuff arthropathy.  Ultrasound-guided injection into the glenohumeral joint is performed today.  Will see how that injection works.  I think it is possible that he may be becoming tolerant to the injections for the most recent injection did not find the mark.  Plan 49-month return for clinical recheck when we are rechecking his wife.  Follow-Up Instructions: No follow-ups on file.   Orders:  Orders Placed This Encounter  Procedures   XR Shoulder Left   US Guided Needle Placement - No Linked Charges   No orders of the defined types were placed in this encounter.     Procedures: Large Joint Inj: L glenohumeral on 11/28/2023 8:17 PM Indications: diagnostic evaluation and pain Details: 22 G 1.5 in and 3.5 in needle, ultrasound-guided posterior approach  Arthrogram: No  Medications: 9 mL bupivacaine 0.5 %; 40 mg methylPREDNISolone acetate 40 MG/ML; 5 mL  lidocaine 1 % Outcome: tolerated well, no immediate complications Procedure, treatment alternatives, risks and benefits explained, specific risks discussed. Consent was given by the patient. Immediately prior to procedure a time out was called to verify the correct patient, procedure, equipment, support staff and site/side marked as required. Patient was prepped and draped in the usual sterile fashion.       Clinical Data: No additional findings.  Objective: Vital Signs: There were no vitals taken for this visit.  Physical Exam:  Constitutional: Patient appears well-developed HEENT:  Head: Normocephalic Eyes:EOM are normal Neck: Normal range of motion Cardiovascular: Normal rate Pulmonary/chest: Effort normal Neurologic: Patient is alert Skin: Skin is warm Psychiatric: Patient has normal mood and affect  Ortho Exam: Ortho exam demonstrates functional deltoid strength on the left.  Motor sensory function of the hand is intact.  Radial pulses intact.  He does have forward flexion just over 90 and abduction to about 80.  Rotator cuff strength is otherwise weak to external rotation but subscap strength is intact.  Coarse grinding and crepitus is present with passive range of motion of that left arm  Specialty Comments:  MRI LUMBAR SPINE WITHOUT CONTRAST     TECHNIQUE:  Multiplanar, multisequence MR imaging of the lumbar spine was  performed. No intravenous contrast was administered.     COMPARISON:  Plain  films Mar 23, 2020     FINDINGS:  Segmentation:  Standard.     Alignment: Levoconvex scoliosis of the lumbar spine. Grade 1  anterolisthesis of L2 over L3 and L4 over L5 with minimal  retrolisthesis of L5 over S1.     Vertebrae:  No fracture, evidence of discitis, or bone lesion.     Conus medullaris and cauda equina: Conus extends to the L1-2 level.  Conus and cauda equina appear normal.     Paraspinal and other soft tissues: Negative.     Disc levels:     T12-L1:  Disc bulge with associated osteophytic component, facet  degenerative changes and ligamentum flavum redundancy resulting in  mild spinal canal stenosis and moderate to severe bilateral neural  foraminal narrowing.     L1-2: Disc bulge, facet degenerative changes and ligamentum flavum  redundancy resulting in mild spinal canal stenosis and moderate  bilateral neural foraminal narrowing.     L2-3: Loss of disc height, disc bulge, facet degenerative changes  and prominent redundancy of the ligamentum flavum resulting in  moderate spinal canal stenosis and mild bilateral neural foraminal  narrowing.     L3-4: Prominent loss of disc height, disc bulge with associated  osteophytic component, facet degenerative changes and ligamentum  flavum redundancy resulting in moderate spinal canal stenosis,  moderate right and mild left neural foraminal narrowing.     L4-5: Prominent loss disc height, endplate ridging, prominent  hypertrophic facet degenerative changes and ligamentum flavum  redundancy resulting in severe spinal canal stenosis and severe  bilateral neural foraminal narrowing.     L5-S1: Disc bulge, facet degenerative changes and ligamentum flavum  redundancy resulting in narrowing of the bilateral subarticular  zones, left greater than right and moderate bilateral neural  foraminal narrowing.     IMPRESSION:  1. Multilevel degenerative changes of the lumbar spine as described  above, worst at L4-5 where there is severe spinal canal stenosis and  severe bilateral neural foraminal narrowing.  2. Moderate spinal canal stenosis at L2-3 and L3-4.  3. Moderate bilateral neural foraminal narrowing at L5-S1 with  narrowing of the bilateral subarticular zones at this level.  4. Moderate to severe bilateral neural foraminal narrowing at  T12-L1.        Electronically Signed    By: Baldemar Lenis M.D.    On: 08/19/2020 11:59  Imaging: No results found.   PMFS  History: 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   Past Medical History:  Diagnosis Date   Coronary artery disease 06/04/2009   stenting to the mid LAD with Xience 2.5 x 32 and overlapping 2.5 x 12 mm DES, [place], [place]   Diabetes mellitus without complication (HCC)    Hyperlipidemia    Hypertension    Retinal artery occlusion     Family History  Problem Relation Age of Onset   Leukemia Mother    Heart attack Brother    Lung cancer Brother     Past Surgical History:  Procedure Laterality Date   CARDIAC CATHETERIZATION  06/04/2009   Stent to LAD in [place], [place]   REPLACEMENT TOTAL KNEE     bOTH   ROTATOR CUFF REPAIR Bilateral  Social History   Occupational History   Not on file  Tobacco Use   Smoking status: Former    Current packs/day: 0.00    Average packs/day: 1 pack/day for 10.0 years (10.0 ttl pk-yrs)    Types: Cigarettes    Start date: 03/11/1939    Quit date: 03/10/1949    Years since quitting: 74.7   Smokeless tobacco: Never  Vaping Use   Vaping status: Never Used  Substance and Sexual Activity   Alcohol use: Yes    Comment: Drinks Socially   Drug use: Never   Sexual activity: Not on file

## 2023-12-04 ENCOUNTER — Ambulatory Visit
Admission: RE | Admit: 2023-12-04 | Discharge: 2023-12-04 | Disposition: A | Discharge status: Home / Self Care | Payer: Medicare Other | Source: Ambulatory Visit | Attending: Internal Medicine | Admitting: Internal Medicine

## 2023-12-04 ENCOUNTER — Encounter: Payer: Self-pay | Admitting: Cardiology

## 2023-12-04 DIAGNOSIS — G459 Transient cerebral ischemic attack, unspecified: Secondary | ICD-10-CM

## 2023-12-05 ENCOUNTER — Encounter: Payer: Self-pay | Admitting: Cardiology

## 2023-12-06 ENCOUNTER — Encounter: Payer: Self-pay | Admitting: Cardiology

## 2023-12-08 ENCOUNTER — Encounter: Payer: Self-pay | Admitting: Cardiology

## 2023-12-10 ENCOUNTER — Encounter: Payer: Self-pay | Admitting: Cardiology

## 2023-12-11 ENCOUNTER — Ambulatory Visit (HOSPITAL_COMMUNITY): Payer: Medicare Other | Attending: Cardiology

## 2023-12-11 ENCOUNTER — Encounter: Payer: Self-pay | Admitting: Cardiology

## 2023-12-11 DIAGNOSIS — G453 Amaurosis fugax: Secondary | ICD-10-CM | POA: Diagnosis present

## 2023-12-11 DIAGNOSIS — I2510 Atherosclerotic heart disease of native coronary artery without angina pectoris: Secondary | ICD-10-CM | POA: Diagnosis present

## 2023-12-11 LAB — ECHOCARDIOGRAM COMPLETE
AR max vel: 1.29 cm2
AV Area VTI: 1.29 cm2
AV Area mean vel: 1.2 cm2
AV Mean grad: 7.9 mmHg
AV Peak grad: 13.1 mmHg
Ao pk vel: 1.81 m/s
Area-P 1/2: 3.08 cm2
S' Lateral: 2.9 cm

## 2023-12-11 NOTE — Progress Notes (Signed)
Stable echo with normal LVEF and age appropriate valve changes

## 2023-12-13 ENCOUNTER — Encounter: Payer: Self-pay | Admitting: Cardiology

## 2023-12-14 ENCOUNTER — Encounter: Payer: Self-pay | Admitting: Cardiology

## 2023-12-16 NOTE — Progress Notes (Signed)
 Stable echocardiogram revealing normal LVEF, mild diastolic dysfunction without significant valvular abnormality.  Diastolic function means the heart has to relax to receive the blood so it can pump the blood out. If relaxation is impaired, then the blood coming to the heart is restricted and can cause dyspnea and reduced functional capacity and fluid build up. Exercise, weight loss, control of BP, salt restriction will improve this.

## 2024-02-06 ENCOUNTER — Other Ambulatory Visit (INDEPENDENT_AMBULATORY_CARE_PROVIDER_SITE_OTHER): Payer: Self-pay

## 2024-02-06 ENCOUNTER — Other Ambulatory Visit: Payer: Self-pay

## 2024-02-06 ENCOUNTER — Encounter: Payer: Self-pay | Admitting: Orthopedic Surgery

## 2024-02-06 ENCOUNTER — Ambulatory Visit (INDEPENDENT_AMBULATORY_CARE_PROVIDER_SITE_OTHER): Admitting: Orthopedic Surgery

## 2024-02-06 DIAGNOSIS — M19011 Primary osteoarthritis, right shoulder: Secondary | ICD-10-CM | POA: Diagnosis not present

## 2024-02-06 DIAGNOSIS — M25511 Pain in right shoulder: Secondary | ICD-10-CM

## 2024-02-06 NOTE — Progress Notes (Signed)
 Office Visit Note   Patient: Jaime [name]           Date of Birth: 07-19-1931           MRN: [id number] Visit Date: 02/06/2024 Requested by: Garlan Fillers, MD [address] [place],  [place] [address] PCP: Garlan Fillers, MD  Subjective: Chief Complaint  Patient presents with   Right Shoulder - Pain    HPI: Wessley Emert is a 89 y.o. male who presents to the office reporting right shoulder pain.  Patient had prior right shoulder rotator cuff tear done about 10 years ago.  Has had recurrent pain recently.  Radiographs do show rotator cuff arthropathy.  Pain does wake him from sleep at night.  Injections have helped him in the past.  Kenalog works best for Gaspar Garbe in terms of duration of relief..                ROS: All systems reviewed are negative as they relate to the chief complaint within the history of present illness.  Patient denies fevers or chills.  Assessment & Plan: Visit Diagnoses:  1. Right shoulder pain, unspecified chronicity     Plan: Impression is right shoulder rotator cuff arthropathy.  Passive motion is reasonably well-maintained.  He has both arthritis in the glenohumeral joint as well as superior migration of the humeral head.  Ultrasound-guided injection performed.  Follow-up with [place] as needed  Follow-Up Instructions: No follow-ups on file.   Orders:  Orders Placed This Encounter  Procedures   XR Shoulder Right   US Guided Needle Placement - No Linked Charges   No orders of the defined types were placed in this encounter.     Procedures: Large Joint Inj: R glenohumeral on 02/06/2024 1:34 PM Indications: diagnostic evaluation and pain Details: 22 G 3.5 in needle, ultrasound-guided posterior approach  Arthrogram: No  Medications: 9 mL bupivacaine 0.5 %; 5 mL lidocaine 1 % Outcome: tolerated well, no immediate complications Procedure, treatment alternatives, risks and benefits explained, specific risks discussed. Consent was given by  the patient. Immediately prior to procedure a time out was called to verify the correct patient, procedure, equipment, support staff and site/side marked as required. Patient was prepped and draped in the usual sterile fashion.     Kenalog injected  Clinical Data: No additional findings.  Objective: Vital Signs: There were no vitals taken for this visit.  Physical Exam:  Constitutional: Patient appears well-developed HEENT:  Head: Normocephalic Eyes:EOM are normal Neck: Normal range of motion Cardiovascular: Normal rate Pulmonary/chest: Effort normal Neurologic: Patient is alert Skin: Skin is warm Psychiatric: Patient has normal mood and affect  Ortho Exam: Ortho exam demonstrates passive range of motion of 20/90/140.  Patient is able to get the arm up to the back of his head but just barely.  Good deltoid strength.  No masses lymphadenopathy or skin changes noted in the shoulder girdle region with good motor or sensory function in the hand palpable radial pulse and no AC joint tenderness.  Specialty Comments:  MRI LUMBAR SPINE WITHOUT CONTRAST     TECHNIQUE:  Multiplanar, multisequence MR imaging of the lumbar spine was  performed. No intravenous contrast was administered.     COMPARISON:  Plain films Mar 23, 2020     FINDINGS:  Segmentation:  Standard.     Alignment: Levoconvex scoliosis of the lumbar spine. Grade 1  anterolisthesis of L2 over L3 and L4 over L5 with minimal  retrolisthesis of L5 over S1.  Vertebrae:  No fracture, evidence of discitis, or bone lesion.     Conus medullaris and cauda equina: Conus extends to the L1-2 level.  Conus and cauda equina appear normal.     Paraspinal and other soft tissues: Negative.     Disc levels:     T12-L1: Disc bulge with associated osteophytic component, facet  degenerative changes and ligamentum flavum redundancy resulting in  mild spinal canal stenosis and moderate to severe bilateral neural  foraminal  narrowing.     L1-2: Disc bulge, facet degenerative changes and ligamentum flavum  redundancy resulting in mild spinal canal stenosis and moderate  bilateral neural foraminal narrowing.     L2-3: Loss of disc height, disc bulge, facet degenerative changes  and prominent redundancy of the ligamentum flavum resulting in  moderate spinal canal stenosis and mild bilateral neural foraminal  narrowing.     L3-4: Prominent loss of disc height, disc bulge with associated  osteophytic component, facet degenerative changes and ligamentum  flavum redundancy resulting in moderate spinal canal stenosis,  moderate right and mild left neural foraminal narrowing.     L4-5: Prominent loss disc height, endplate ridging, prominent  hypertrophic facet degenerative changes and ligamentum flavum  redundancy resulting in severe spinal canal stenosis and severe  bilateral neural foraminal narrowing.     L5-S1: Disc bulge, facet degenerative changes and ligamentum flavum  redundancy resulting in narrowing of the bilateral subarticular  zones, left greater than right and moderate bilateral neural  foraminal narrowing.     IMPRESSION:  1. Multilevel degenerative changes of the lumbar spine as described  above, worst at L4-5 where there is severe spinal canal stenosis and  severe bilateral neural foraminal narrowing.  2. Moderate spinal canal stenosis at L2-3 and L3-4.  3. Moderate bilateral neural foraminal narrowing at L5-S1 with  narrowing of the bilateral subarticular zones at this level.  4. Moderate to severe bilateral neural foraminal narrowing at  T12-L1.        Electronically Signed    By: Baldemar Lenis M.D.    On: 08/19/2020 11:59  Imaging: XR Shoulder Right Result Date: 02/06/2024 AP axillary outlet radiographs right shoulder reviewed.  Prior metal anchors from rotator cuff repair are noted.  Significant superior migration of the humeral head is present.  Glenohumeral  arthritis also noted.  Shoulder is located.  No acute fracture.  Visualized lung fields clear.  US Guided Needle Placement - No Linked Charges Result Date: 02/06/2024 Ultrasound imaging demonstrates needle placement into the glenohumeral joint with injection of fluid into the joint and no complicating features.  Right shoulder     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   Past Medical History:  Diagnosis Date   Coronary artery disease 06/04/2009   stenting to the mid LAD with Xience 2.5 x 32 and overlapping 2.5 x 12 mm DES, [place], [place]   Diabetes mellitus without complication (HCC)    Hyperlipidemia    Hypertension    Retinal artery occlusion     Family History  Problem Relation Age of Onset   Leukemia Mother    Heart attack Brother    Lung cancer Brother     Past Surgical History:  Procedure Laterality Date   CARDIAC CATHETERIZATION  06/04/2009   Stent to LAD in [place], [place]   REPLACEMENT TOTAL KNEE     bOTH   ROTATOR CUFF REPAIR Bilateral    Social History   Occupational History   Not on file  Tobacco Use   Smoking status: Former    Current packs/day: 0.00    Average packs/day: 1 pack/day for 10.0 years (10.0 ttl pk-yrs)    Types: Cigarettes    Start date: 03/11/1939    Quit date: 03/10/1949    Years since quitting: 74.9   Smokeless tobacco: Never  Vaping Use   Vaping status: Never Used  Substance and Sexual Activity   Alcohol use: Yes    Comment: Drinks Socially   Drug use: Never   Sexual activity: Not on file

## 2024-02-09 MED ORDER — LIDOCAINE HCL 1 % IJ SOLN
5.0000 mL | INTRAMUSCULAR | Status: AC | PRN
  Administered 2024-02-06: 5 mL

## 2024-02-09 MED ORDER — BUPIVACAINE HCL 0.5 % IJ SOLN
9.0000 mL | INTRAMUSCULAR | Status: AC | PRN
  Administered 2024-02-06: 9 mL via INTRA_ARTICULAR

## 2024-02-11 ENCOUNTER — Ambulatory Visit: Admitting: Orthopedic Surgery

## 2024-03-05 ENCOUNTER — Other Ambulatory Visit: Payer: Self-pay

## 2024-03-05 ENCOUNTER — Ambulatory Visit (INDEPENDENT_AMBULATORY_CARE_PROVIDER_SITE_OTHER): Payer: Medicare Other | Admitting: Orthopedic Surgery

## 2024-03-05 ENCOUNTER — Encounter: Payer: Self-pay | Admitting: Orthopedic Surgery

## 2024-03-05 DIAGNOSIS — M19012 Primary osteoarthritis, left shoulder: Secondary | ICD-10-CM

## 2024-03-05 DIAGNOSIS — G8929 Other chronic pain: Secondary | ICD-10-CM | POA: Diagnosis not present

## 2024-03-05 DIAGNOSIS — M25512 Pain in left shoulder: Secondary | ICD-10-CM

## 2024-03-05 MED ORDER — METHYLPREDNISOLONE ACETATE 40 MG/ML IJ SUSP
40.0000 mg | INTRAMUSCULAR | Status: DC | PRN
  Administered 2024-03-05: 40 mg via INTRA_ARTICULAR

## 2024-03-05 MED ORDER — LIDOCAINE HCL 1 % IJ SOLN
5.0000 mL | INTRAMUSCULAR | Status: AC | PRN
  Administered 2024-03-05: 5 mL

## 2024-03-05 MED ORDER — BUPIVACAINE HCL 0.5 % IJ SOLN
9.0000 mL | INTRAMUSCULAR | Status: AC | PRN
  Administered 2024-03-05: 9 mL via INTRA_ARTICULAR

## 2024-03-05 NOTE — Progress Notes (Addendum)
 Office Visit Note   Patient: Jaime [name]           Date of Birth: 07-31-1931           MRN: [id number] Visit Date: 03/05/2024 Requested by: Bertha Broad, MD [address] [place],  [place] [address] PCP: Bertha Broad, MD  Subjective: Chief Complaint  Patient presents with   Right Shoulder - Pain    HPI: Jaime [name] is a 89 y.o. male who presents to the office reporting right and left shoulder pain.  Right shoulder injection helped him for several weeks but that was given earlier this month.  Reports pain in the left shoulder.  Has rotator cuff arthropathy in both shoulders.  Uses Advil as needed..                ROS: All systems reviewed are negative as they relate to the chief complaint within the history of present illness.  Patient denies fevers or chills.  Assessment & Plan: Visit Diagnoses:  1. Chronic left shoulder pain   2. Arthritis of left shoulder region     Plan: Impression is left shoulder rotator cuff arthropathy.  Pain is worse on the side.  Does have limited function on that side but his deltoid fires.  Ultrasound-guided injection performed today.  Will see how he does with that injection and could consider further intervention down the road if needed.  Follow-Up Instructions: No follow-ups on file.   Orders:  Orders Placed This Encounter  Procedures   US  Guided Needle Placement - No Linked Charges   No orders of the defined types were placed in this encounter.     Procedures: Large Joint Inj: L glenohumeral on 03/05/2024 6:41 PM Indications: diagnostic evaluation and pain Details: 22 G 3.5 in needle, ultrasound-guided posterior approach  Arthrogram: No  Medications: 9 mL bupivacaine  0.5 %; 5 mL lidocaine  1 % Outcome: tolerated well, no immediate complications Procedure, treatment alternatives, risks and benefits explained, specific risks discussed. Consent was given by the patient. Immediately prior to procedure a time out was  called to verify the correct patient, procedure, equipment, support staff and site/side marked as required. Patient was prepped and draped in the usual sterile fashion.     Triamcinolone injected  Clinical Data: No additional findings.  Objective: Vital Signs: There were no vitals taken for this visit.  Physical Exam:  Constitutional: Patient appears well-developed HEENT:  Head: Normocephalic Eyes:EOM are normal Neck: Normal range of motion Cardiovascular: Normal rate Pulmonary/chest: Effort normal Neurologic: Patient is alert Skin: Skin is warm Psychiatric: Patient has normal mood and affect  Ortho Exam: Ortho exam demonstrates functional deltoid.  Active forward flexion and abduction both below 60 degrees.  Rotator cuff strength is weak to external rotation at 3- out of 5 but subscap strength is intact to manual muscle testing at 5- out of 5.  Coarseness is present with passive range of motion at 90 degrees of abduction passively.  Specialty Comments:  MRI LUMBAR SPINE WITHOUT CONTRAST     TECHNIQUE:  Multiplanar, multisequence MR imaging of the lumbar spine was  performed. No intravenous contrast was administered.     COMPARISON:  Plain films Mar 23, 2020     FINDINGS:  Segmentation:  Standard.     Alignment: Levoconvex scoliosis of the lumbar spine. Grade 1  anterolisthesis of L2 over L3 and L4 over L5 with minimal  retrolisthesis of L5 over S1.     Vertebrae:  No fracture, evidence of  discitis, or bone lesion.     Conus medullaris and cauda equina: Conus extends to the L1-2 level.  Conus and cauda equina appear normal.     Paraspinal and other soft tissues: Negative.     Disc levels:     T12-L1: Disc bulge with associated osteophytic component, facet  degenerative changes and ligamentum flavum redundancy resulting in  mild spinal canal stenosis and moderate to severe bilateral neural  foraminal narrowing.     L1-2: Disc bulge, facet degenerative changes  and ligamentum flavum  redundancy resulting in mild spinal canal stenosis and moderate  bilateral neural foraminal narrowing.     L2-3: Loss of disc height, disc bulge, facet degenerative changes  and prominent redundancy of the ligamentum flavum resulting in  moderate spinal canal stenosis and mild bilateral neural foraminal  narrowing.     L3-4: Prominent loss of disc height, disc bulge with associated  osteophytic component, facet degenerative changes and ligamentum  flavum redundancy resulting in moderate spinal canal stenosis,  moderate right and mild left neural foraminal narrowing.     L4-5: Prominent loss disc height, endplate ridging, prominent  hypertrophic facet degenerative changes and ligamentum flavum  redundancy resulting in severe spinal canal stenosis and severe  bilateral neural foraminal narrowing.     L5-S1: Disc bulge, facet degenerative changes and ligamentum flavum  redundancy resulting in narrowing of the bilateral subarticular  zones, left greater than right and moderate bilateral neural  foraminal narrowing.     IMPRESSION:  1. Multilevel degenerative changes of the lumbar spine as described  above, worst at L4-5 where there is severe spinal canal stenosis and  severe bilateral neural foraminal narrowing.  2. Moderate spinal canal stenosis at L2-3 and L3-4.  3. Moderate bilateral neural foraminal narrowing at L5-S1 with  narrowing of the bilateral subarticular zones at this level.  4. Moderate to severe bilateral neural foraminal narrowing at  T12-L1.        Electronically Signed    By: Katyucia  De Macedo Rodrigues M.D.    On: 08/19/2020 11:59  Imaging: US  Guided Needle Placement - No Linked Charges Result Date: 03/05/2024 Ultrasound imaging demonstrates needle placement into the glenohumeral joint with injection of fluid into the joint and no complicating features. Left shoulder    PMFS History: Patient Active Problem List   Diagnosis Date  Noted   Coronary artery disease involving native coronary artery of native heart without angina pectoris 10/06/2021   Pure hypercholesterolemia 10/06/2021   Glaucoma suspect, left eye 08/16/2021   Central retinal artery occlusion of right eye 08/05/2020   Optic atrophy, right eye 08/05/2020   Past Medical History:  Diagnosis Date   Coronary artery disease 06/04/2009   stenting to the mid LAD with Xience 2.5 x 32 and overlapping 2.5 x 12 mm DES, [place], [place]   Diabetes mellitus without complication (HCC)    Hyperlipidemia    Hypertension    Retinal artery occlusion     Family History  Problem Relation Age of Onset   Leukemia Mother    Heart attack Brother    Lung cancer Brother     Past Surgical History:  Procedure Laterality Date   CARDIAC CATHETERIZATION  06/04/2009   Stent to LAD in [place], [place]   REPLACEMENT TOTAL KNEE     bOTH   ROTATOR CUFF REPAIR Bilateral    Social History   Occupational History   Not on file  Tobacco Use   Smoking status: Former    Current  packs/day: 0.00    Average packs/day: 1 pack/day for 10.0 years (10.0 ttl pk-yrs)    Types: Cigarettes    Start date: 03/11/1939    Quit date: 03/10/1949    Years since quitting: 75.0   Smokeless tobacco: Never  Vaping Use   Vaping status: Never Used  Substance and Sexual Activity   Alcohol use: Yes    Comment: Drinks Socially   Drug use: Never   Sexual activity: Not on file

## 2024-03-06 ENCOUNTER — Encounter: Payer: Self-pay | Admitting: Orthopedic Surgery

## 2024-06-03 ENCOUNTER — Encounter: Payer: Self-pay | Admitting: Cardiology

## 2024-06-03 ENCOUNTER — Ambulatory Visit: Attending: Cardiology | Admitting: Cardiology

## 2024-06-03 VITALS — BP 126/74 | HR 64 | Resp 16 | Ht 66.0 in | Wt 147.6 lb

## 2024-06-03 DIAGNOSIS — I2510 Atherosclerotic heart disease of native coronary artery without angina pectoris: Secondary | ICD-10-CM | POA: Insufficient documentation

## 2024-06-03 DIAGNOSIS — G453 Amaurosis fugax: Secondary | ICD-10-CM | POA: Insufficient documentation

## 2024-06-03 DIAGNOSIS — I452 Bifascicular block: Secondary | ICD-10-CM | POA: Diagnosis present

## 2024-06-03 DIAGNOSIS — E7800 Pure hypercholesterolemia, unspecified: Secondary | ICD-10-CM | POA: Diagnosis present

## 2024-06-03 NOTE — Progress Notes (Signed)
 Cardiology Office Note:  .   Date:  06/03/2024  ID:  Jaime [name], DOB [birth year], MRN [id number] PCP: Yolande Toribio MATSU, MD  [facility] Providers Cardiologist:  Gordy Bergamo, MD   History of Present Illness: .   Jaime [name] is a 89 y.o. 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 He also has history of small vessel PAD from diabetes mellitus with mildly decreased peripheral pulses.  His last presentation to the emergency room on 11/11/2023 with left eye amaurosis fugax, he also has history of CRA right eye in 2019 and is concerned about vision loss.  I discontinued aspirin and started him on Plavix  and obtained extended EKG monitoring which did not reveal atrial fibrillation.  He now presents for a 45-month office visit.  He disc to Plavix  due to bruising and is back on aspirin.  Fortunately he has not had any further episodes of amaurosis fugax.  He is a 75 dependent on left eye vision as he is complete loss of vision on the right due to CRA occlusion.   Moved from [place]  to be closer to his daughter in [place] since March 2021. Remains active. His wife has MS and he is the active caregiver.  Discussed the use of AI scribe software for clinical note transcription with the patient, who gave verbal consent to proceed.  History of Present Illness Jaime [name] is a 89 year old male with a history of central retinal artery occlusion who presents with concerns about vision loss and left shoulder pain, states that he may need shoulder replacement but is concerned whether he is fit from cardiac standpoint to proceed with the same.  He experienced a brief episode of vision loss in his left eye, described as amaurosis fugax. He has  complete vision loss in the right eye due to central retinal artery occlusion and is dependent on his left eye for vision. He was previously switched from aspirin to Plavix  but reverted back to aspirin due to excessive bruising.  He feels more fatigued than usual and wakes up tired. No episodes of syncope, chest pain, or further vision issues. His current medications include aspirin, metoprolol, rosuvastatin 20 mg, Tresiba, and Jardiance. His most recent LDL was 55, and his kidney function tests have been within normal limits.  Labs   External Labs:  Labs 11/12/2023:   Hb 12.6/HCT 39.4, platelets 155, normal indicis.    TSH normal at 3.36.  A1c 7.8%.  Labs 10/24/2023:  Total cholesterol 111, triglycerides 75, HDL 41, LDL 55.   ROS  Review of Systems  Cardiovascular:  Negative for chest pain, dyspnea on exertion and leg swelling.   Physical Exam:   VS:  BP 126/74 (BP Location: Left Arm, Patient Position: Sitting, Cuff Size: Normal)   Pulse 64   Resp 16   Ht 5' 6 (1.676 m)   Wt 147 lb 9.6 oz (67 kg)   SpO2 97%   BMI 23.82 kg/m    Wt Readings from Last 3 Encounters:  06/03/24 147 lb 9.6 oz (67 kg)  11/11/23 148 lb (67.1 kg)  10/12/22 160 lb 6.4 oz (72.8 kg)    Physical Exam Neck:     Vascular: No carotid bruit or JVD.  Cardiovascular:     Rate and Rhythm: Normal rate  and regular rhythm.     Pulses: Intact distal pulses.     Heart sounds: Normal heart sounds. No murmur heard.    No gallop.  Pulmonary:     Effort: Pulmonary effort is normal.     Breath sounds: Normal breath sounds.  Abdominal:     General: Bowel sounds are normal.     Palpations: Abdomen is soft.  Musculoskeletal:     Right lower leg: No edema.     Left lower leg: No edema.    Studies Reviewed: SABRA    ECHOCARDIOGRAM COMPLETE 12/11/2023  1. Left ventricular ejection fraction, by estimation, is 55 to 60%. Left ventricular ejection fraction by 3D volume is 57 %. The left ventricle has normal function.  The left ventricle has no regional wall motion abnormalities. There is moderate asymmetric left ventricular hypertrophy of the basal-septal segment. Left ventricular diastolic parameters are consistent with Grade I diastolic dysfunction (impaired relaxation). 2. Right ventricular systolic function is normal. The right ventricular size is mildly enlarged. There is normal pulmonary artery systolic pressure. The estimated right ventricular systolic pressure is 29.0 mmHg. 3. Left atrial size was mildly dilated. 4. Right atrial size was mildly dilated. 5. The mitral valve is grossly normal. Trivial mitral valve regurgitation. No evidence of mitral stenosis. 6. The aortic valve is tricuspid. There is moderate calcification of the aortic valve. There is moderate thickening of the aortic valve. Aortic valve regurgitation is not visualized. Aortic valve sclerosis/calcification is present, without any evidence of aortic stenosis. 7. The inferior vena cava is normal in size with greater than 50% respiratory variability, suggesting right atrial pressure of 3 mmHg.  Zio patch extended EKG monitoring 12 days starting 11/22/2023: Predominant rhythm was normal sinus rhythm.  Minimum heart rate 59 bpm at 12:13 PM and maximum 138 bpm at 9:30 PM.  Average heart rate 78 bpm. There were 574 SVT episodes.  Longest 1 minute and 42 seconds with a average heartbeat of 93 bpm.  EKG reveals brief atrial tachycardia and PACs.  Rare PACs, couplets and atrial triplets were noted.  There was no atrial fibrillation. Isolated PVCs (1.4% burden) and rare ventricular couplets and triplets were present.  Ventricular bigeminy was present. 2 NSVT episodes, longest 5 beats, 4 beat episode at 5 PM and 5 beat episode at 5:30 AM. No heart block. Patient triggered/diary events reveal PACs and PVCs.       EKG:         Medications ordered    No orders of the defined types were placed in this encounter.    ASSESSMENT AND PLAN: .       ICD-10-CM   1. Coronary artery disease involving native coronary artery of native heart without angina pectoris  I25.10     2. Bifascicular block  I45.2     3. Pure hypercholesterolemia  E78.00     4. Amaurosis fugax of left eye  G45.3      Assessment & Plan Coronary artery disease Coronary artery disease is well-managed with no recent chest pain or syncope. Cholesterol levels are well-controlled with LDL at 55 mg/dL. - Continue rosuvastatin 20 mg daily  Bifascicular block Bifascicular block on EKG with no symptoms of dizziness or syncope. Heart rate is stable.  Chronic left shoulder pain due to primary osteoarthritis Chronic left shoulder pain significantly affects quality of life and sleep. He is considering shoulder replacement surgery. Cardiovascular risk for surgery is low, and surgery is recommended if it improves quality of life. He  is the primary caregiver for his wife, which is a consideration in his decision-making process. - Consult with orthopedic surgeon regarding shoulder replacement surgery - Perform stress test if surgery is planned and surgeon deems him a candidate  Fatigue, possible sleep-disordered breathing Experiencing increased fatigue, possibly due to sleep-disordered breathing. Previous nocturnal oximetry was normal, but considering repeat testing to evaluate current oxygen levels during sleep. If oxygen levels are low, nocturnal oxygen therapy may be considered. - Consider repeat nocturnal oximetry to assess oxygen levels during sleep - If oxygen levels are low, consider nocturnal oxygen therapy - Patient also states that he has had disturbed sleep due to severe pain in his shoulder, he would like to wait on this until decision is made on surgery.  Hyperlipidemia Hyperlipidemia is well-controlled with LDL at 55 mg/dL. - Continue rosuvastatin 20 mg daily  Office visit in 6 months. Signed,  Gordy Bergamo, MD, [facility] 06/03/2024, 10:19 AM [facility] [address] [place], [place] [address] Phone: [phone]. Fax:  [phone]

## 2024-06-03 NOTE — Patient Instructions (Signed)

## 2024-06-05 ENCOUNTER — Other Ambulatory Visit: Payer: Self-pay

## 2024-06-05 ENCOUNTER — Ambulatory Visit (INDEPENDENT_AMBULATORY_CARE_PROVIDER_SITE_OTHER): Admitting: Orthopedic Surgery

## 2024-06-05 ENCOUNTER — Encounter: Payer: Self-pay | Admitting: Cardiology

## 2024-06-05 DIAGNOSIS — M25511 Pain in right shoulder: Secondary | ICD-10-CM

## 2024-06-05 DIAGNOSIS — M19012 Primary osteoarthritis, left shoulder: Secondary | ICD-10-CM | POA: Diagnosis not present

## 2024-06-05 DIAGNOSIS — M19011 Primary osteoarthritis, right shoulder: Secondary | ICD-10-CM | POA: Diagnosis not present

## 2024-06-05 DIAGNOSIS — M25512 Pain in left shoulder: Secondary | ICD-10-CM | POA: Diagnosis not present

## 2024-06-05 DIAGNOSIS — Z01818 Encounter for other preprocedural examination: Secondary | ICD-10-CM

## 2024-06-05 DIAGNOSIS — I2510 Atherosclerotic heart disease of native coronary artery without angina pectoris: Secondary | ICD-10-CM

## 2024-06-05 DIAGNOSIS — G8929 Other chronic pain: Secondary | ICD-10-CM

## 2024-06-05 NOTE — Telephone Encounter (Signed)
 Please schedule for Lexiscan nuclear stress test for preop evaluation, coronary artery disease of the native vessel without angina pectoris

## 2024-06-06 ENCOUNTER — Encounter: Payer: Self-pay | Admitting: Orthopedic Surgery

## 2024-06-06 ENCOUNTER — Encounter (HOSPITAL_COMMUNITY): Payer: Self-pay | Admitting: Cardiology

## 2024-06-06 MED ORDER — TRIAMCINOLONE ACETONIDE 40 MG/ML IJ SUSP
40.0000 mg | INTRAMUSCULAR | Status: AC | PRN
Start: 2024-06-05 — End: 2024-06-05
  Administered 2024-06-05: 40 mg via INTRA_ARTICULAR

## 2024-06-06 MED ORDER — BUPIVACAINE HCL 0.5 % IJ SOLN
9.0000 mL | INTRAMUSCULAR | Status: AC | PRN
Start: 2024-06-05 — End: 2024-06-05
  Administered 2024-06-05: 9 mL via INTRA_ARTICULAR

## 2024-06-06 MED ORDER — LIDOCAINE HCL 1 % IJ SOLN
5.0000 mL | INTRAMUSCULAR | Status: AC | PRN
  Administered 2024-06-05: 5 mL

## 2024-06-06 MED ORDER — BUPIVACAINE HCL 0.5 % IJ SOLN
9.0000 mL | INTRAMUSCULAR | Status: AC | PRN
  Administered 2024-06-05: 9 mL via INTRA_ARTICULAR

## 2024-06-06 NOTE — Progress Notes (Signed)
 Office Visit Note   Patient: Jaime [name]           Date of Birth: 1931-02-20           MRN: [id number] Visit Date: 06/05/2024 Requested by: Yolande Toribio MATSU, MD [address] [place],  [place] [address] PCP: Yolande Toribio MATSU, MD  Subjective: Chief Complaint  Patient presents with   Other    Bilateral shoulder pain    HPI: Jaime [name] is a 89 y.o. male who presents to the office reporting continued bilateral and severe shoulder pain left worse than right.  Reports fairly constant pain in that left shoulder which wakes him from sleep at night.  Has had injections in both shoulders in the past which have helped.  Wants to discuss surgical intervention.  He has seen cardiology use said that he needed a stress test prior to surgery.  He does take care of his wife who has MS but that primarily involves not a lot of lifting but more helping her with activities of daily living..                ROS: All systems reviewed are negative as they relate to the chief complaint within the history of present illness.  Patient denies fevers or chills.  Assessment & Plan: Visit Diagnoses:  1. Chronic left shoulder pain   2. Right shoulder pain, unspecified chronicity   3. Arthritis of right shoulder   4. Arthritis of left shoulder region     Plan: Impression is end-stage rotator cuff arthropathy in both shoulders.  Right shoulder injected with cortisone/Kenalog today.  Left shoulder injected with Toradol.  We will plan think that CT scan for preoperative planning purposes for left reverse shoulder replacement.  The risk and benefits are discussed with the patient include not limited to infection nerve vessel damage incomplete pain relief as well as incomplete functional restoration.  The stress of elective joint replacement is also discussed.  He will be optimized by his medical physicians prior to the surgery.  Patient understands risk and benefits.  We have also discussed.  Anticipate out  of sling at 2 weeks with no lifting more than 20 pounds after 6 weeks.  Home health occupational therapy will be utilized in the first 2 weeks with outpatient therapy thereafter.  Follow-Up Instructions: No follow-ups on file.   Orders:  Orders Placed This Encounter  Procedures   US  Guided Needle Placement - No Linked Charges   CT SHOULDER LEFT WO CONTRAST   No orders of the defined types were placed in this encounter.     Procedures: Large Joint Inj: R glenohumeral on 06/05/2024 6:56 PM Indications: diagnostic evaluation and pain Details: 22 G 3.5 in needle, ultrasound-guided posterior approach  Arthrogram: No  Medications: 9 mL bupivacaine  0.5 %; 5 mL lidocaine  1 %; 40 mg triamcinolone acetonide 40 MG/ML Outcome: tolerated well, no immediate complications Procedure, treatment alternatives, risks and benefits explained, specific risks discussed. Consent was given by the patient. Immediately prior to procedure a time out was called to verify the correct patient, procedure, equipment, support staff and site/side marked as required. Patient was prepped and draped in the usual sterile fashion.    Large Joint Inj: L glenohumeral on 06/05/2024 6:57 PM Indications: diagnostic evaluation and pain Details: 22 G 3.5 in needle, ultrasound-guided posterior approach  Arthrogram: No  Medications: 9 mL bupivacaine  0.5 %; 5 mL lidocaine  1 % Outcome: tolerated well, no immediate complications Procedure, treatment alternatives, risks and benefits  explained, specific risks discussed. Consent was given by the patient. Immediately prior to procedure a time out was called to verify the correct patient, procedure, equipment, support staff and site/side marked as required. Patient was prepped and draped in the usual sterile fashion.    1 cc Toradol injected left shoulder   Clinical Data: No additional findings.  Objective: Vital Signs: There were no vitals taken for this visit.  Physical Exam:   Constitutional: Patient appears well-developed HEENT:  Head: Normocephalic Eyes:EOM are normal Neck: Normal range of motion Cardiovascular: Normal rate Pulmonary/chest: Effort normal Neurologic: Patient is alert Skin: Skin is warm Psychiatric: Patient has normal mood and affect  Ortho Exam: Ortho exam demonstrates functional deltoid on both sides.  He has less than 90 degrees of forward flexion abduction with external rotation weakness on the left.  Subscap strength is about 4+ out of 5 on the left.  Coarseness and grinding is present consistent with his known diagnosis of rotator cuff arthropathy.  Motor or sensory function to the hands intact.  Specialty Comments:  MRI LUMBAR SPINE WITHOUT CONTRAST     TECHNIQUE:  Multiplanar, multisequence MR imaging of the lumbar spine was  performed. No intravenous contrast was administered.     COMPARISON:  Plain films Mar 23, 2020     FINDINGS:  Segmentation:  Standard.     Alignment: Levoconvex scoliosis of the lumbar spine. Grade 1  anterolisthesis of L2 over L3 and L4 over L5 with minimal  retrolisthesis of L5 over S1.     Vertebrae:  No fracture, evidence of discitis, or bone lesion.     Conus medullaris and cauda equina: Conus extends to the L1-2 level.  Conus and cauda equina appear normal.     Paraspinal and other soft tissues: Negative.     Disc levels:     T12-L1: Disc bulge with associated osteophytic component, facet  degenerative changes and ligamentum flavum redundancy resulting in  mild spinal canal stenosis and moderate to severe bilateral neural  foraminal narrowing.     L1-2: Disc bulge, facet degenerative changes and ligamentum flavum  redundancy resulting in mild spinal canal stenosis and moderate  bilateral neural foraminal narrowing.     L2-3: Loss of disc height, disc bulge, facet degenerative changes  and prominent redundancy of the ligamentum flavum resulting in  moderate spinal canal stenosis and mild  bilateral neural foraminal  narrowing.     L3-4: Prominent loss of disc height, disc bulge with associated  osteophytic component, facet degenerative changes and ligamentum  flavum redundancy resulting in moderate spinal canal stenosis,  moderate right and mild left neural foraminal narrowing.     L4-5: Prominent loss disc height, endplate ridging, prominent  hypertrophic facet degenerative changes and ligamentum flavum  redundancy resulting in severe spinal canal stenosis and severe  bilateral neural foraminal narrowing.     L5-S1: Disc bulge, facet degenerative changes and ligamentum flavum  redundancy resulting in narrowing of the bilateral subarticular  zones, left greater than right and moderate bilateral neural  foraminal narrowing.     IMPRESSION:  1. Multilevel degenerative changes of the lumbar spine as described  above, worst at L4-5 where there is severe spinal canal stenosis and  severe bilateral neural foraminal narrowing.  2. Moderate spinal canal stenosis at L2-3 and L3-4.  3. Moderate bilateral neural foraminal narrowing at L5-S1 with  narrowing of the bilateral subarticular zones at this level.  4. Moderate to severe bilateral neural foraminal narrowing at  T12-L1.  Electronically Signed    By: Katyucia  De Macedo Rodrigues M.D.    On: 08/19/2020 11:59  Imaging: No results found.   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   Past Medical History:  Diagnosis Date   Coronary artery disease 06/04/2009   stenting to the mid LAD with Xience 2.5 x 32 and overlapping 2.5 x 12 mm DES, [place], [place]   Diabetes mellitus without complication (HCC)     Hyperlipidemia    Hypertension    Retinal artery occlusion     Family History  Problem Relation Age of Onset   Leukemia Mother    Heart attack Brother    Lung cancer Brother     Past Surgical History:  Procedure Laterality Date   CARDIAC CATHETERIZATION  06/04/2009   Stent to LAD in [place], [place]   REPLACEMENT TOTAL KNEE     bOTH   ROTATOR CUFF REPAIR Bilateral    Social History   Occupational History   Not on file  Tobacco Use   Smoking status: Former    Current packs/day: 0.00    Average packs/day: 1 pack/day for 10.0 years (10.0 ttl pk-yrs)    Types: Cigarettes    Start date: 03/11/1939    Quit date: 03/10/1949    Years since quitting: 75.2   Smokeless tobacco: Never  Vaping Use   Vaping status: Never Used  Substance and Sexual Activity   Alcohol use: Yes    Comment: Drinks Socially   Drug use: Never   Sexual activity: Not on file

## 2024-06-09 ENCOUNTER — Encounter (HOSPITAL_COMMUNITY): Payer: Self-pay | Admitting: *Deleted

## 2024-06-09 ENCOUNTER — Telehealth: Payer: Self-pay

## 2024-06-09 ENCOUNTER — Telehealth (HOSPITAL_COMMUNITY): Payer: Self-pay | Admitting: *Deleted

## 2024-06-09 NOTE — Telephone Encounter (Signed)
   Pre-operative Risk Assessment    Patient Name: Jaime [name]  DOB: 1931-02-18 MRN: [id number]   Date of last office visit: 06/03/2024, Dr. Gordy Bergamo, MD Date of next office visit: NONE   Request for Surgical Clearance    Procedure:  Reverse shoulder arthroplasty   Date of Surgery:  Clearance TBD                                Surgeon: Dr. JUDITHANN Glendia Hutchinson, MD Surgeon's Group or Practice Name: [facility] Phone number: [phone] Fax number:  [phone]   Type of Clearance Requested:   - Medical  - Pharmacy:  Hold Aspirin     Type of Anesthesia:  General    Additional requests/questions:    SignedAsberry KANDICE Dunning   06/09/2024, 2:43 PM

## 2024-06-09 NOTE — Telephone Encounter (Signed)
 Dr. Ladona,  Jaime [name] was recently seen by you in clinic on 06/03/2024.  He remained stable from a cardiac standpoint at that time.  He is requesting preoperative cardiac evaluation for reverse shoulder arthroplasty.  Would you be able to comment on cardiac risk for upcoming procedure?  May his aspirin be held prior to his procedure?  Thank you for your help.  Please direct your response to CV DIV preop pool.  Jaime [name]. Jaime Salmons NP-C     06/09/2024, 3:09 PM [facility] [address] Office [phone] Fax [phone]

## 2024-06-09 NOTE — Telephone Encounter (Signed)
Letter sent with instructions for upcoming stress test.  Jaime [name], Jaime [name]  

## 2024-06-10 ENCOUNTER — Encounter: Payer: Self-pay | Admitting: Cardiology

## 2024-06-10 ENCOUNTER — Ambulatory Visit
Admission: RE | Admit: 2024-06-10 | Discharge: 2024-06-10 | Disposition: A | Discharge status: Home / Self Care | Source: Ambulatory Visit | Attending: Orthopedic Surgery | Admitting: Orthopedic Surgery

## 2024-06-10 DIAGNOSIS — G8929 Other chronic pain: Secondary | ICD-10-CM

## 2024-06-10 NOTE — Telephone Encounter (Signed)
 Waiting on the stress test

## 2024-06-16 ENCOUNTER — Other Ambulatory Visit: Payer: Self-pay | Admitting: Cardiology

## 2024-06-16 DIAGNOSIS — Z01818 Encounter for other preprocedural examination: Secondary | ICD-10-CM

## 2024-06-16 DIAGNOSIS — I2510 Atherosclerotic heart disease of native coronary artery without angina pectoris: Secondary | ICD-10-CM

## 2024-06-18 ENCOUNTER — Ambulatory Visit: Payer: Self-pay | Admitting: Cardiology

## 2024-06-18 ENCOUNTER — Ambulatory Visit: Payer: Self-pay | Admitting: Orthopedic Surgery

## 2024-06-18 ENCOUNTER — Ambulatory Visit (HOSPITAL_COMMUNITY)
Admission: RE | Admit: 2024-06-18 | Discharge: 2024-06-18 | Disposition: A | Discharge status: Home / Self Care | Source: Ambulatory Visit | Attending: Cardiology | Admitting: Cardiology

## 2024-06-18 DIAGNOSIS — I2510 Atherosclerotic heart disease of native coronary artery without angina pectoris: Secondary | ICD-10-CM | POA: Insufficient documentation

## 2024-06-18 DIAGNOSIS — Z01818 Encounter for other preprocedural examination: Secondary | ICD-10-CM | POA: Diagnosis not present

## 2024-06-18 LAB — MYOCARDIAL PERFUSION IMAGING
LV dias vol: 116 mL (ref 62–150)
LV sys vol: 54 mL (ref 4.2–5.8)
Nuc Stress EF: 53 %
Peak HR: 85 {beats}/min
Rest HR: 65 {beats}/min
Rest Nuclear Isotope Dose: 10.9 mCi
SDS: 0
SRS: 11
SSS: 8
ST Depression (mm): 0 mm
Stress Nuclear Isotope Dose: 31.5 mCi
TID: 1.13

## 2024-06-18 MED ORDER — TECHNETIUM TC 99M TETROFOSMIN IV KIT
31.5000 | PACK | Freq: Once | INTRAVENOUS | Status: AC | PRN
  Administered 2024-06-18 (×2): 31.5 via INTRAVENOUS

## 2024-06-18 MED ORDER — TECHNETIUM TC 99M TETROFOSMIN IV KIT
10.9000 | PACK | Freq: Once | INTRAVENOUS | Status: AC | PRN
  Administered 2024-06-18 (×2): 10.9 via INTRAVENOUS

## 2024-06-18 MED ORDER — REGADENOSON 0.4 MG/5ML IV SOLN
INTRAVENOUS | Status: AC
  Filled 2024-06-18: qty 5

## 2024-06-18 MED ORDER — REGADENOSON 0.4 MG/5ML IV SOLN
0.4000 mg | Freq: Once | INTRAVENOUS | Status: AC
  Administered 2024-06-18 (×2): 0.4 mg via INTRAVENOUS

## 2024-06-18 NOTE — Progress Notes (Signed)
 Normal stress test, low risk.  I have already made the surgical clearance to Dr. Glendia Hutchinson, orthopedics to proceed with surgery.  It is in the letter section.

## 2024-06-18 NOTE — Progress Notes (Signed)
 Blue sheet done, Marval can you call him on Friday or Monday and see what the cardiologist said and if he thinks it is okay then we will get him set up for surgery.  Thanks

## 2024-06-22 ENCOUNTER — Ambulatory Visit: Payer: Self-pay | Admitting: Cardiology

## 2024-07-05 ENCOUNTER — Encounter: Payer: Self-pay | Admitting: Orthopedic Surgery

## 2024-07-08 NOTE — Telephone Encounter (Signed)
 We seeing him on 9/4? If not can you bring him in for shot thx

## 2024-07-10 ENCOUNTER — Other Ambulatory Visit: Payer: Self-pay

## 2024-07-10 ENCOUNTER — Ambulatory Visit (INDEPENDENT_AMBULATORY_CARE_PROVIDER_SITE_OTHER): Admitting: Orthopedic Surgery

## 2024-07-10 DIAGNOSIS — M19042 Primary osteoarthritis, left hand: Secondary | ICD-10-CM

## 2024-07-10 DIAGNOSIS — M79642 Pain in left hand: Secondary | ICD-10-CM | POA: Diagnosis not present

## 2024-07-10 NOTE — Pre-Procedure Instructions (Signed)
 Surgical Instructions     Your procedure is scheduled on Tuesday, July 15, 2024. Report to [facility] Main Entrance A at 5:30 A.M., then check in with the Admitting office. Any questions or running late day of surgery: call [phone]   Questions prior to your surgery date: call [phone], Monday-Friday, 8am-4pm. If you experience any cold or flu symptoms such as cough, fever, chills, shortness of breath, etc. between now and your scheduled surgery, please notify us  at the above number.            Remember:       Do not eat after midnight the night before your surgery   You may drink clear liquids until 4:30am the morning of your surgery.   Clear liquids allowed are: Water, Non-Citrus Juices (without pulp), Carbonated Beverages, Clear Tea (no milk, honey, etc.), Black Coffee Only (NO MILK, CREAM OR POWDERED CREAMER of any kind), and Gatorade.   Patient Instructions   The night before surgery:  No food after midnight. ONLY clear liquids after midnight   The day of surgery (if you have diabetes): Drink ONE (1) 12 oz G2 given to you in your pre admission testing appointment by 4:30am the morning of surgery. Drink in one sitting. Do not sip.  This drink was given to you during your hospital  pre-op appointment visit.  Nothing else to drink after completing the  12 oz bottle of G2.          If you have questions, please contact your surgeon's office.            Take these medicines the morning of surgery with A SIP OF WATER : Levothyroxine (Synthroid) Metoprolol Succinate (Toprol-XL) Mirabegron ER (Myrbetriq) Rosuvastatin (Crestor) Silodosin (Rapaflo)   May take these medicines IF NEEDED: Nitroglycerin  (Nitrostat )--Please call [phone] and let a nurse know if you take this Oxymetazoline (Afrin) nasal spray   WHAT DO I DO ABOUT MY DIABETES MEDICATION?   DO NOT take Jardiance 3 Days prior to surgery. Your last dose will be on 07/11/24.     Do not take oral  diabetes medicines (pills) the morning of surgery. DO NOT take Jardiance.                      THE MORNING OF SURGERY, take a HALF dose of your Tresiba Flex Pen Insulin which is 8 units.     HOW TO MANAGE YOUR DIABETES BEFORE AND AFTER SURGERY   Why is it important to control my blood sugar before and after surgery? Improving blood sugar levels before and after surgery helps healing and can limit problems. A way of improving blood sugar control is eating a healthy diet by:  Eating less sugar and carbohydrates  Increasing activity/exercise  Talking with your doctor about reaching your blood sugar goals High blood sugars (greater than 180 mg/dL) can raise your risk of infections and slow your recovery, so you will need to focus on controlling your diabetes during the weeks before surgery. Make sure that the doctor who takes care of your diabetes knows about your planned surgery including the date and location.   How do I manage my blood sugar before surgery? Check your blood sugar at least 4 times a day, starting 2 days before surgery, to make sure that the level is not too high or low.   Check your blood sugar the morning of your surgery when you wake up and every 2 hours until you get to  the Short Stay unit.   If your blood sugar is less than 70 mg/dL, you will need to treat for low blood sugar: Do not take insulin. Treat a low blood sugar (less than 70 mg/dL) with  cup of clear juice (cranberry or apple), 4 glucose tablets, OR glucose gel. Recheck blood sugar in 15 minutes after treatment (to make sure it is greater than 70 mg/dL). If your blood sugar is not greater than 70 mg/dL on recheck, call [phone] for further instructions. Report your blood sugar to the short stay nurse when you get to Short Stay.   If you are admitted to the hospital after surgery: Your blood sugar will be checked by the staff and you will probably be given insulin after surgery (instead of oral diabetes  medicines) to make sure you have good blood sugar levels. The goal for blood sugar control after surgery is 80-180 mg/dL.     Follow your surgeon's instructions when to STOP Aspirin.  If no instructions were given by your surgeon, then you will need to call the office to obtain instructions.    One week prior to surgery, STOP taking any Aleve, Naproxen, Ibuprofen, Motrin, Advil, Goody's, BC's, all herbal medications, fish oil, and non-prescription vitamins.                     Do NOT Smoke (Tobacco/Vaping) for 24 hours prior to your procedure.   If you use a CPAP at night, you may bring your mask/headgear for your overnight stay.   You will be asked to remove any contacts, glasses, piercing's, hearing aid's, dentures/partials prior to surgery. Please bring cases for these items if needed.    Patients discharged the day of surgery will not be allowed to drive home, and someone needs to stay with them for 24 hours.   SURGICAL WAITING ROOM VISITATION Patients may have no more than 2 support people in the waiting area - these visitors may rotate.   Pre-op nurse will coordinate an appropriate time for 1 ADULT support person, who may not rotate, to accompany patient in pre-op.  Children under the age of 39 must have an adult with them who is not the patient and must remain in the main waiting area with an adult.   If the patient needs to stay at the hospital during part of their recovery, the visitor guidelines for inpatient rooms apply.   Please refer to the [facility] website for the visitor guidelines for any additional information.     If you received a COVID test during your pre-op visit  it is requested that you wear a mask when out in public, stay away from anyone that may not be feeling well and notify your surgeon if you develop symptoms. If you have been in contact with anyone that has tested positive in the last 10 days please notify you surgeon.      [place]- Preparing for  Total Shoulder Arthroplasty    Before surgery, you can play an important role. Because skin is not sterile, your skin needs to be as free of germs as possible. You can reduce the number of germs on your skin by using the following products. Benzoyl Peroxide Gel Reduces the number of germs present on the skin Applied twice a day to shoulder area starting two days before surgery   Chlorhexidine Gluconate (CHG) Soap An antiseptic cleaner that kills germs and bonds with the skin to continue killing germs even after washing  Used for showering the night before surgery and morning of surgery   Oral Hygiene is also important to reduce your risk of infection.                                    Remember - BRUSH YOUR TEETH THE MORNING OF SURGERY WITH YOUR REGULAR TOOTHPASTE   ==================================================================   Please follow these instructions carefully:   BENZOYL PEROXIDE 5% GEL   Please do not use if you have an allergy to benzoyl peroxide.   If your skin becomes reddened/irritated stop using the benzoyl peroxide.   Starting two days before surgery, apply as follows: Apply benzoyl peroxide in the morning and at night. Apply after taking a shower. If you are not taking a shower clean entire shoulder front, back, and side along with the armpit with a clean wet washcloth.   Place a quarter-sized dollop on your shoulder and rub in thoroughly, making sure to cover the front, back, and side of your shoulder, along with the armpit.    2 days before ____ AM   ____ PM              1 day before ____ AM   ____ PM                                Do this twice a day for two days.  (Last application is the night before surgery, AFTER using the CHG soap as described below).   Do NOT apply benzoyl peroxide gel on the day of surgery.            Pre-operative 5 CHG Bathing Instructions    You can play a key role in reducing the risk of infection after surgery. Your skin  needs to be as free of germs as possible. You can reduce the number of germs on your skin by washing with CHG (chlorhexidine gluconate) soap before surgery. CHG is an antiseptic soap that kills germs and continues to kill germs even after washing.    DO NOT use if you have an allergy to chlorhexidine/CHG or antibacterial soaps. If your skin becomes reddened or irritated, stop using the CHG and notify one of our RNs at [phone].    Please shower with the CHG soap starting 4 days before surgery using the following schedule:       Please keep in mind the following:  DO NOT shave, including legs and underarms, starting the day of your first shower.   You may shave your face at any point before/day of surgery.  Place clean sheets on your bed the day you start using CHG soap. Use a clean washcloth (not used since being washed) for each shower. DO NOT sleep with pets once you start using the CHG.    CHG Shower Instructions:  Wash your face and private area with normal soap. If you choose to wash your hair, wash first with your normal shampoo.  After you use shampoo/soap, rinse your hair and body thoroughly to remove shampoo/soap residue.  Turn the water OFF and apply about 3 tablespoons (45 ml) of CHG soap to a CLEAN washcloth.  Apply CHG soap ONLY FROM YOUR NECK DOWN TO YOUR TOES (washing for 3-5 minutes)  DO NOT use CHG soap on face, private areas, open wounds, or sores.  Pay special attention to  the area where your surgery is being performed.  If you are having back surgery, having someone wash your back for you may be helpful. Wait 2 minutes after CHG soap is applied, then you may rinse off the CHG soap.  Pat dry with a clean towel  Put on clean clothes/pajamas   If you choose to wear lotion, please use ONLY the CHG-compatible lotions that are listed below.   Additional instructions for the day of surgery: DO NOT APPLY any lotions, deodorants, cologne, or perfumes.   Do not bring  valuables to the hospital. [facility] is not responsible for any belongings/valuables. Do not wear nail polish, gel polish, artificial nails, or any other type of covering on natural nails (fingers and toes) Do not wear jewelry or makeup Put on clean/comfortable clothes.  Please brush your teeth.  Ask your nurse before applying any prescription medications to the skin.        CHG Compatible Lotions    Aveeno Moisturizing lotion  Cetaphil Moisturizing Cream  Cetaphil Moisturizing Lotion  Clairol Herbal Essence Moisturizing Lotion, Dry Skin  Clairol Herbal Essence Moisturizing Lotion, Extra Dry Skin  Clairol Herbal Essence Moisturizing Lotion, Normal Skin  Curel Age Defying Therapeutic Moisturizing Lotion with Alpha Hydroxy  Curel Extreme Care Body Lotion  Curel Soothing Hands Moisturizing Hand Lotion  Curel Therapeutic Moisturizing Cream, Fragrance-Free  Curel Therapeutic Moisturizing Lotion, Fragrance-Free  Curel Therapeutic Moisturizing Lotion, Original Formula  Eucerin Daily Replenishing Lotion  Eucerin Dry Skin Therapy Plus Alpha Hydroxy Crme  Eucerin Dry Skin Therapy Plus Alpha Hydroxy Lotion  Eucerin Original Crme  Eucerin Original Lotion  Eucerin Plus Crme Eucerin Plus Lotion  Eucerin TriLipid Replenishing Lotion  Keri Anti-Bacterial Hand Lotion  Keri Deep Conditioning Original Lotion Dry Skin Formula Softly Scented  Keri Deep Conditioning Original Lotion, Fragrance Free Sensitive Skin Formula  Keri Lotion Fast Absorbing Fragrance Free Sensitive Skin Formula  Keri Lotion Fast Absorbing Softly Scented Dry Skin Formula  Keri Original Lotion  Keri Skin Renewal Lotion Keri Silky Smooth Lotion  Keri Silky Smooth Sensitive Skin Lotion  Nivea Body Creamy Conditioning Oil  Nivea Body Extra Enriched Lotion  Nivea Body Original Lotion  Nivea Body Sheer Moisturizing Lotion Nivea Crme  Nivea Skin Firming Lotion  NutraDerm 30 Skin Lotion  NutraDerm Skin Lotion  NutraDerm  Therapeutic Skin Cream  NutraDerm Therapeutic Skin Lotion  ProShield Protective Hand Cream  Provon moisturizing lotion   Please read over the following fact sheets that you were given.

## 2024-07-11 ENCOUNTER — Encounter (HOSPITAL_COMMUNITY): Payer: Self-pay

## 2024-07-11 ENCOUNTER — Encounter (HOSPITAL_COMMUNITY)
Admission: RE | Admit: 2024-07-11 | Discharge: 2024-07-11 | Disposition: A | Discharge status: Home / Self Care | Source: Ambulatory Visit | Attending: Orthopedic Surgery | Admitting: Orthopedic Surgery

## 2024-07-11 ENCOUNTER — Encounter: Payer: Self-pay | Admitting: Orthopedic Surgery

## 2024-07-11 ENCOUNTER — Other Ambulatory Visit: Payer: Self-pay

## 2024-07-11 VITALS — BP 161/77 | HR 68 | Temp 97.8°F | Resp 18 | Ht 66.5 in | Wt 148.8 lb

## 2024-07-11 DIAGNOSIS — K449 Diaphragmatic hernia without obstruction or gangrene: Secondary | ICD-10-CM | POA: Insufficient documentation

## 2024-07-11 DIAGNOSIS — E785 Hyperlipidemia, unspecified: Secondary | ICD-10-CM | POA: Insufficient documentation

## 2024-07-11 DIAGNOSIS — I10 Essential (primary) hypertension: Secondary | ICD-10-CM | POA: Diagnosis not present

## 2024-07-11 DIAGNOSIS — Z01812 Encounter for preprocedural laboratory examination: Secondary | ICD-10-CM | POA: Insufficient documentation

## 2024-07-11 DIAGNOSIS — I2510 Atherosclerotic heart disease of native coronary artery without angina pectoris: Secondary | ICD-10-CM | POA: Insufficient documentation

## 2024-07-11 DIAGNOSIS — E1151 Type 2 diabetes mellitus with diabetic peripheral angiopathy without gangrene: Secondary | ICD-10-CM | POA: Diagnosis not present

## 2024-07-11 DIAGNOSIS — Z01818 Encounter for other preprocedural examination: Secondary | ICD-10-CM

## 2024-07-11 DIAGNOSIS — Z7982 Long term (current) use of aspirin: Secondary | ICD-10-CM | POA: Insufficient documentation

## 2024-07-11 DIAGNOSIS — E039 Hypothyroidism, unspecified: Secondary | ICD-10-CM | POA: Diagnosis not present

## 2024-07-11 HISTORY — DX: Unspecified osteoarthritis, unspecified site: M19.90

## 2024-07-11 HISTORY — DX: Hypothyroidism, unspecified: E03.9

## 2024-07-11 LAB — CBC
HCT: 42.5 % (ref 39.0–52.0)
Hemoglobin: 13.2 g/dL (ref 13.0–17.0)
MCH: 19.6 pg — ABNORMAL LOW (ref 26.0–34.0)
MCHC: 31.1 g/dL (ref 30.0–36.0)
MCV: 63.1 fL — ABNORMAL LOW (ref 80.0–100.0)
Platelets: 133 10*3/uL — ABNORMAL LOW (ref 150–400)
RBC: 6.74 MIL/uL — ABNORMAL HIGH (ref 4.22–5.81)
RDW: 18.5 % — ABNORMAL HIGH (ref 11.5–15.5)
WBC: 11.8 10*3/uL — ABNORMAL HIGH (ref 4.0–10.5)
nRBC: 0 % (ref 0.0–0.2)

## 2024-07-11 LAB — BASIC METABOLIC PANEL WITH GFR
Anion gap: 9 (ref 5–15)
BUN: 25 mg/dL — ABNORMAL HIGH (ref 8–23)
CO2: 26 mmol/L (ref 22–32)
Calcium: 9.7 mg/dL (ref 8.9–10.3)
Chloride: 105 mmol/L (ref 98–111)
Creatinine, Ser: 1.01 mg/dL (ref 0.61–1.24)
GFR, Estimated: >60 mL/min
Glucose, Bld: 133 mg/dL — ABNORMAL HIGH (ref 70–99)
Potassium: 4.3 mmol/L (ref 3.5–5.1)
Sodium: 140 mmol/L (ref 135–145)

## 2024-07-11 LAB — URINALYSIS, W/ REFLEX TO CULTURE (INFECTION SUSPECTED)
Bacteria, UA: NONE SEEN
Bilirubin Urine: NEGATIVE
Glucose, UA: >=500 mg/dL — AB
Ketones, ur: NEGATIVE mg/dL
Leukocytes,Ua: NEGATIVE
Nitrite: NEGATIVE
Protein, ur: NEGATIVE mg/dL
RBC / HPF: >50 RBC/hpf (ref 0–5)
Specific Gravity, Urine: 1.03 (ref 1.005–1.030)
pH: 5 (ref 5.0–8.0)

## 2024-07-11 LAB — SURGICAL PCR SCREEN
MRSA, PCR: NEGATIVE
Staphylococcus aureus: NEGATIVE

## 2024-07-11 NOTE — Progress Notes (Signed)
 Surgical Instructions     Your procedure is scheduled on Tuesday, July 15, 2024. Report to [facility] Main Entrance A at 5:30 A.M., then check in with the Admitting office. Any questions or running late day of surgery: call [phone]   Questions prior to your surgery date: call [phone], Monday-Friday, 8am-4pm. If you experience any cold or flu symptoms such as cough, fever, chills, shortness of breath, etc. between now and your scheduled surgery, please notify us  at the above number.            Remember:       Do not eat after midnight the night before your surgery   You may drink clear liquids until 4:30am the morning of your surgery.   Clear liquids allowed are: Water, Non-Citrus Juices (without pulp), Carbonated Beverages, Clear Tea (no milk, honey, etc.), Black Coffee Only (NO MILK, CREAM OR POWDERED CREAMER of any kind), and Gatorade.   Patient Instructions   The night before surgery:  No food after midnight. ONLY clear liquids after midnight   The day of surgery (if you have diabetes): Drink ONE (1) 12 oz G2 given to you in your pre admission testing appointment by 4:30am the morning of surgery. Drink in one sitting. Do not sip.  This drink was given to you during your hospital  pre-op appointment visit.  Nothing else to drink after completing the  12 oz bottle of G2.          If you have questions, please contact your surgeon's office.            Take these medicines the morning of surgery with A SIP OF WATER : Levothyroxine (Synthroid) Metoprolol Succinate (Toprol-XL) Mirabegron ER (Myrbetriq) Rosuvastatin (Crestor) Silodosin (Rapaflo)   May take these medicines IF NEEDED: Nitroglycerin  (Nitrostat )--Please call [phone] and let a nurse know if you take this Oxymetazoline (Afrin) nasal spray   WHAT DO I DO ABOUT MY DIABETES MEDICATION?   DO NOT take Jardiance 3 Days prior to surgery. Your last dose will be on 07/11/24.     Do not take oral  diabetes medicines (pills) the morning of surgery. DO NOT take Jardiance.                      THE MORNING OF SURGERY, take a HALF dose of your Tresiba Flex Pen Insulin which is 8 units.     HOW TO MANAGE YOUR DIABETES BEFORE AND AFTER SURGERY   Why is it important to control my blood sugar before and after surgery? Improving blood sugar levels before and after surgery helps healing and can limit problems. A way of improving blood sugar control is eating a healthy diet by:  Eating less sugar and carbohydrates  Increasing activity/exercise  Talking with your doctor about reaching your blood sugar goals High blood sugars (greater than 180 mg/dL) can raise your risk of infections and slow your recovery, so you will need to focus on controlling your diabetes during the weeks before surgery. Make sure that the doctor who takes care of your diabetes knows about your planned surgery including the date and location.   How do I manage my blood sugar before surgery? Check your blood sugar at least 4 times a day, starting 2 days before surgery, to make sure that the level is not too high or low.   Check your blood sugar the morning of your surgery when you wake up and every 2 hours until you get to  the Short Stay unit.   If your blood sugar is less than 70 mg/dL, you will need to treat for low blood sugar: Do not take insulin. Treat a low blood sugar (less than 70 mg/dL) with  cup of clear juice (cranberry or apple), 4 glucose tablets, OR glucose gel. Recheck blood sugar in 15 minutes after treatment (to make sure it is greater than 70 mg/dL). If your blood sugar is not greater than 70 mg/dL on recheck, call [phone] for further instructions. Report your blood sugar to the short stay nurse when you get to Short Stay.   If you are admitted to the hospital after surgery: Your blood sugar will be checked by the staff and you will probably be given insulin after surgery (instead of oral diabetes  medicines) to make sure you have good blood sugar levels. The goal for blood sugar control after surgery is 80-180 mg/dL.     Follow your surgeon's instructions when to STOP Aspirin.  If no instructions were given by your surgeon, then you will need to call the office to obtain instructions.    One week prior to surgery, STOP taking any Aleve, Naproxen, Ibuprofen, Motrin, Advil, Goody's, BC's, all herbal medications, fish oil, and non-prescription vitamins.                     Do NOT Smoke (Tobacco/Vaping) for 24 hours prior to your procedure.   If you use a CPAP at night, you may bring your mask/headgear for your overnight stay.   You will be asked to remove any contacts, glasses, piercing's, hearing aid's, dentures/partials prior to surgery. Please bring cases for these items if needed.    Patients discharged the day of surgery will not be allowed to drive home, and someone needs to stay with them for 24 hours.   SURGICAL WAITING ROOM VISITATION Patients may have no more than 2 support people in the waiting area - these visitors may rotate.   Pre-op nurse will coordinate an appropriate time for 1 ADULT support person, who may not rotate, to accompany patient in pre-op.  Children under the age of 39 must have an adult with them who is not the patient and must remain in the main waiting area with an adult.   If the patient needs to stay at the hospital during part of their recovery, the visitor guidelines for inpatient rooms apply.   Please refer to the [facility] website for the visitor guidelines for any additional information.     If you received a COVID test during your pre-op visit  it is requested that you wear a mask when out in public, stay away from anyone that may not be feeling well and notify your surgeon if you develop symptoms. If you have been in contact with anyone that has tested positive in the last 10 days please notify you surgeon.      [place]- Preparing for  Total Shoulder Arthroplasty    Before surgery, you can play an important role. Because skin is not sterile, your skin needs to be as free of germs as possible. You can reduce the number of germs on your skin by using the following products. Benzoyl Peroxide Gel Reduces the number of germs present on the skin Applied twice a day to shoulder area starting two days before surgery   Chlorhexidine Gluconate (CHG) Soap An antiseptic cleaner that kills germs and bonds with the skin to continue killing germs even after washing  Used for showering the night before surgery and morning of surgery   Oral Hygiene is also important to reduce your risk of infection.                                    Remember - BRUSH YOUR TEETH THE MORNING OF SURGERY WITH YOUR REGULAR TOOTHPASTE   ==================================================================   Please follow these instructions carefully:   BENZOYL PEROXIDE 5% GEL   Please do not use if you have an allergy to benzoyl peroxide.   If your skin becomes reddened/irritated stop using the benzoyl peroxide.   Starting two days before surgery, apply as follows: Apply benzoyl peroxide in the morning and at night. Apply after taking a shower. If you are not taking a shower clean entire shoulder front, back, and side along with the armpit with a clean wet washcloth.   Place a quarter-sized dollop on your shoulder and rub in thoroughly, making sure to cover the front, back, and side of your shoulder, along with the armpit.    2 days before ____ AM   ____ PM              1 day before ____ AM   ____ PM                                Do this twice a day for two days.  (Last application is the night before surgery, AFTER using the CHG soap as described below).   Do NOT apply benzoyl peroxide gel on the day of surgery.            Pre-operative 5 CHG Bathing Instructions    You can play a key role in reducing the risk of infection after surgery. Your skin  needs to be as free of germs as possible. You can reduce the number of germs on your skin by washing with CHG (chlorhexidine gluconate) soap before surgery. CHG is an antiseptic soap that kills germs and continues to kill germs even after washing.    DO NOT use if you have an allergy to chlorhexidine/CHG or antibacterial soaps. If your skin becomes reddened or irritated, stop using the CHG and notify one of our RNs at [phone].    Please shower with the CHG soap starting 4 days before surgery using the following schedule:       Please keep in mind the following:  DO NOT shave, including legs and underarms, starting the day of your first shower.   You may shave your face at any point before/day of surgery.  Place clean sheets on your bed the day you start using CHG soap. Use a clean washcloth (not used since being washed) for each shower. DO NOT sleep with pets once you start using the CHG.    CHG Shower Instructions:  Wash your face and private area with normal soap. If you choose to wash your hair, wash first with your normal shampoo.  After you use shampoo/soap, rinse your hair and body thoroughly to remove shampoo/soap residue.  Turn the water OFF and apply about 3 tablespoons (45 ml) of CHG soap to a CLEAN washcloth.  Apply CHG soap ONLY FROM YOUR NECK DOWN TO YOUR TOES (washing for 3-5 minutes)  DO NOT use CHG soap on face, private areas, open wounds, or sores.  Pay special attention to  the area where your surgery is being performed.  If you are having back surgery, having someone wash your back for you may be helpful. Wait 2 minutes after CHG soap is applied, then you may rinse off the CHG soap.  Pat dry with a clean towel  Put on clean clothes/pajamas   If you choose to wear lotion, please use ONLY the CHG-compatible lotions that are listed below.   Additional instructions for the day of surgery: DO NOT APPLY any lotions, deodorants, cologne, or perfumes.   Do not bring  valuables to the hospital. [facility] is not responsible for any belongings/valuables. Do not wear nail polish, gel polish, artificial nails, or any other type of covering on natural nails (fingers and toes) Do not wear jewelry or makeup Put on clean/comfortable clothes.  Please brush your teeth.  Ask your nurse before applying any prescription medications to the skin.        CHG Compatible Lotions    Aveeno Moisturizing lotion  Cetaphil Moisturizing Cream  Cetaphil Moisturizing Lotion  Clairol Herbal Essence Moisturizing Lotion, Dry Skin  Clairol Herbal Essence Moisturizing Lotion, Extra Dry Skin  Clairol Herbal Essence Moisturizing Lotion, Normal Skin  Curel Age Defying Therapeutic Moisturizing Lotion with Alpha Hydroxy  Curel Extreme Care Body Lotion  Curel Soothing Hands Moisturizing Hand Lotion  Curel Therapeutic Moisturizing Cream, Fragrance-Free  Curel Therapeutic Moisturizing Lotion, Fragrance-Free  Curel Therapeutic Moisturizing Lotion, Original Formula  Eucerin Daily Replenishing Lotion  Eucerin Dry Skin Therapy Plus Alpha Hydroxy Crme  Eucerin Dry Skin Therapy Plus Alpha Hydroxy Lotion  Eucerin Original Crme  Eucerin Original Lotion  Eucerin Plus Crme Eucerin Plus Lotion  Eucerin TriLipid Replenishing Lotion  Keri Anti-Bacterial Hand Lotion  Keri Deep Conditioning Original Lotion Dry Skin Formula Softly Scented  Keri Deep Conditioning Original Lotion, Fragrance Free Sensitive Skin Formula  Keri Lotion Fast Absorbing Fragrance Free Sensitive Skin Formula  Keri Lotion Fast Absorbing Softly Scented Dry Skin Formula  Keri Original Lotion  Keri Skin Renewal Lotion Keri Silky Smooth Lotion  Keri Silky Smooth Sensitive Skin Lotion  Nivea Body Creamy Conditioning Oil  Nivea Body Extra Enriched Lotion  Nivea Body Original Lotion  Nivea Body Sheer Moisturizing Lotion Nivea Crme  Nivea Skin Firming Lotion  NutraDerm 30 Skin Lotion  NutraDerm Skin Lotion  NutraDerm  Therapeutic Skin Cream  NutraDerm Therapeutic Skin Lotion  ProShield Protective Hand Cream  Provon moisturizing lotion   Please read over the following fact sheets that you were given.

## 2024-07-11 NOTE — Progress Notes (Addendum)
 PCP - Toribio Shan  Cardiologist -  Ladona Heinz, MD   PPM/ICD - denies Device Orders - n/a Rep Notified - n/a  Chest x-ray - denies EKG - 11-22-23 Stress Test - 06-18-24 ECHO - 12-11-23 Cardiac Cath - 06-04-09  Sleep Study - per patient about 4 years ago everything was good CPAP - N/A  Fasting Blood Sugar - MD monitors A1c. Last A1c on 06-10-24 7.0 JARDIANCE Last dose 07-11-24   Blood Thinner Instructions: denies Aspirin Instructions: instructed patient to reach out to surgeon. Per Dr. Ladona note on 06-18-24 I prefer to continue aspirin 81 mg periprocedurally unless absolutely necessary, you can hold aspirin for 7 days and restart immediately when stable from surgical standpoint.  Per patient he had not held asa  ERAS Protcol - clear liquids until 4:30 am. PRE-SURGERY G2-   COVID TEST- n/a   Anesthesia review: yes cardiac history. Patient has a spinal cord stimulator, instructed patient to bring remote  Patient denies shortness of breath, fever, cough and chest pain at PAT appointment   All instructions explained to the patient, with a verbal understanding of the material. Patient agrees to go over the instructions while at home for a better understanding. Patient also instructed to self quarantine after being tested for COVID-19. The opportunity to ask questions was provided.

## 2024-07-12 ENCOUNTER — Encounter: Payer: Self-pay | Admitting: Orthopedic Surgery

## 2024-07-12 MED ORDER — TRIAMCINOLONE ACETONIDE 40 MG/ML IJ SUSP
10.0000 mg | INTRAMUSCULAR | Status: AC | PRN
  Administered 2024-07-10: 10 mg

## 2024-07-12 MED ORDER — LIDOCAINE HCL 1 % IJ SOLN
0.5000 mL | INTRAMUSCULAR | Status: AC | PRN
Start: 2024-07-10 — End: 2024-07-10
  Administered 2024-07-10: .5 mL

## 2024-07-12 NOTE — Progress Notes (Signed)
 Office Visit Note   Patient: Jaime [name]           Date of Birth: 07/24/1931           MRN: [id number] Visit Date: 07/10/2024 Requested by: Yolande Toribio MATSU, MD [address] [place],  [place] [address] PCP: Yolande Toribio MATSU, MD  Subjective: Chief Complaint  Patient presents with   Other    Discuss upcoming shoulder surgery    HPI: Jaime [name] is a 89 y.o. male who presents to the office reporting continued left shoulder pain.  Patient scheduled for left reverse shoulder replacement next week.  Patient reports significant pain in that left shoulder which prevents him from doing activities of daily living without pain and also prevents him from helping his wife who has MS.  Right shoulder also has rotator cuff arthropathy but is less painful.  Patient has had injections which have helped but only given short-term benefit.  Patient also reports left finger pain with known arthritis in the PIP joint.  He has had injections in the joints in the past which have helped him.  He would like to have that again as well..                ROS: All systems reviewed are negative as they relate to the chief complaint within the history of present illness.  Patient denies fevers or chills.  Assessment & Plan: Visit Diagnoses:  1. Pain in left hand     Plan: Impression is left shoulder rotator cuff arthropathy.  Plan for left reverse shoulder replacement.  Risk and benefits are discussed with the patient including not limited to infection or vessel damage incomplete pain relief as well as incomplete restoration of function.  And instability.  Patient understands risk and benefits.  Models were used to describe reverse shoulder replacement as well as the rationale for patient-specific instrumentation to optimize component position for stability function and pain relief.  Index PIP joint also injected under ultrasound guidance today.  Follow-up 2 weeks after procedure.  Rehab plans also  discussed.  Follow-Up Instructions: No follow-ups on file.   Orders:  Orders Placed This Encounter  Procedures   US  Guided Needle Placement - No Linked Charges   No orders of the defined types were placed in this encounter.     Procedures: Hand/UE Inj: L index PIP for osteoarthritis on 07/10/2024 10:12 PM Details: 25 G needle, ultrasound-guided ulnar approach Medications: 0.5 mL lidocaine  1 %; 10 mg triamcinolone  acetonide 40 MG/ML      Clinical Data: No additional findings.  Objective: Vital Signs: There were no vitals taken for this visit.  Physical Exam:  Constitutional: Patient appears well-developed HEENT:  Head: Normocephalic Eyes:EOM are normal Neck: Normal range of motion Cardiovascular: Normal rate Pulmonary/chest: Effort normal Neurologic: Patient is alert Skin: Skin is warm Psychiatric: Patient has normal mood and affect  Ortho Exam: Ortho exam demonstrates pain tenderness and swelling in the index PIP joint.  Extension and flexion is intact in the fingers and thumb.  Left shoulder exam is unchanged with diminished active forward flexion and abduction.  Deltoid does fire.  Motor or sensory function to the hand is intact.  Specialty Comments:  MRI LUMBAR SPINE WITHOUT CONTRAST     TECHNIQUE:  Multiplanar, multisequence MR imaging of the lumbar spine was  performed. No intravenous contrast was administered.     COMPARISON:  Plain films Mar 23, 2020     FINDINGS:  Segmentation:  Standard.  Alignment: Levoconvex scoliosis of the lumbar spine. Grade 1  anterolisthesis of L2 over L3 and L4 over L5 with minimal  retrolisthesis of L5 over S1.     Vertebrae:  No fracture, evidence of discitis, or bone lesion.     Conus medullaris and cauda equina: Conus extends to the L1-2 level.  Conus and cauda equina appear normal.     Paraspinal and other soft tissues: Negative.     Disc levels:     T12-L1: Disc bulge with associated osteophytic component,  facet  degenerative changes and ligamentum flavum redundancy resulting in  mild spinal canal stenosis and moderate to severe bilateral neural  foraminal narrowing.     L1-2: Disc bulge, facet degenerative changes and ligamentum flavum  redundancy resulting in mild spinal canal stenosis and moderate  bilateral neural foraminal narrowing.     L2-3: Loss of disc height, disc bulge, facet degenerative changes  and prominent redundancy of the ligamentum flavum resulting in  moderate spinal canal stenosis and mild bilateral neural foraminal  narrowing.     L3-4: Prominent loss of disc height, disc bulge with associated  osteophytic component, facet degenerative changes and ligamentum  flavum redundancy resulting in moderate spinal canal stenosis,  moderate right and mild left neural foraminal narrowing.     L4-5: Prominent loss disc height, endplate ridging, prominent  hypertrophic facet degenerative changes and ligamentum flavum  redundancy resulting in severe spinal canal stenosis and severe  bilateral neural foraminal narrowing.     L5-S1: Disc bulge, facet degenerative changes and ligamentum flavum  redundancy resulting in narrowing of the bilateral subarticular  zones, left greater than right and moderate bilateral neural  foraminal narrowing.     IMPRESSION:  1. Multilevel degenerative changes of the lumbar spine as described  above, worst at L4-5 where there is severe spinal canal stenosis and  severe bilateral neural foraminal narrowing.  2. Moderate spinal canal stenosis at L2-3 and L3-4.  3. Moderate bilateral neural foraminal narrowing at L5-S1 with  narrowing of the bilateral subarticular zones at this level.  4. Moderate to severe bilateral neural foraminal narrowing at  T12-L1.        Electronically Signed    By: Katyucia  De Macedo Rodrigues M.D.    On: 08/19/2020 11:59  Imaging: No results found.   PMFS History: Patient Active Problem List   Diagnosis Date  Noted   Coronary artery disease involving native coronary artery of native heart without angina pectoris 10/06/2021   Pure hypercholesterolemia 10/06/2021   Glaucoma suspect, left eye 08/16/2021   Central retinal artery occlusion of right eye 08/05/2020   Optic atrophy, right eye 08/05/2020   Past Medical History:  Diagnosis Date   Arthritis    Coronary artery disease 06/04/2009   stenting to the mid LAD with Xience 2.5 x 32 and overlapping 2.5 x 12 mm DES, [place], [place]   Diabetes mellitus without complication (HCC)    Hyperlipidemia    Hypertension    Hypothyroidism    Retinal artery occlusion     Family History  Problem Relation Age of Onset   Leukemia Mother    Heart attack Brother    Lung cancer Brother     Past Surgical History:  Procedure Laterality Date   CARDIAC CATHETERIZATION  06/04/2009   Stent to LAD in [place], [place]   REPLACEMENT TOTAL KNEE     bOTH   ROTATOR CUFF REPAIR Bilateral    Social History   Occupational History   Not  on file  Tobacco Use   Smoking status: Former    Current packs/day: 0.00    Average packs/day: 1 pack/day for 10.0 years (10.0 ttl pk-yrs)    Types: Cigarettes    Start date: 03/11/1939    Quit date: 03/10/1949    Years since quitting: 75.3   Smokeless tobacco: Never  Vaping Use   Vaping status: Never Used  Substance and Sexual Activity   Alcohol use: Yes    Comment: Drinks Socially   Drug use: Never   Sexual activity: Not on file

## 2024-07-14 NOTE — Anesthesia Preprocedure Evaluation (Addendum)
 Anesthesia Evaluation  Patient identified by MRN, date of birth, ID band Patient awake    Reviewed: Allergy & Precautions, NPO status , Patient's Chart, lab work & pertinent test results  Airway Mallampati: II  TM Distance: >3 FB Neck ROM: Full    Dental  (+) Teeth Intact, Dental Advisory Given   Pulmonary former smoker   Pulmonary exam normal breath sounds clear to auscultation       Cardiovascular hypertension, Pt. on home beta blockers (-) angina + CAD and + Cardiac Stents  Normal cardiovascular exam Rhythm:Regular Rate:Normal     Neuro/Psych CVA    GI/Hepatic negative GI ROS, Neg liver ROS,,,  Endo/Other  diabetes, Type 2, Oral Hypoglycemic Agents, Insulin  DependentHypothyroidism    Renal/GU negative Renal ROS     Musculoskeletal  (+) Arthritis ,   left shoulder osteoarthritis   Abdominal   Peds  Hematology negative hematology ROS (+)   Anesthesia Other Findings   Reproductive/Obstetrics                              Anesthesia Physical Anesthesia Plan  ASA: 3  Anesthesia Plan: General   Post-op Pain Management: Regional block* and Tylenol  PO (pre-op)*   Induction: Intravenous  PONV Risk Score and Plan: 2 and Dexamethasone  and Ondansetron   Airway Management Planned: Oral ETT  Additional Equipment:   Intra-op Plan:   Post-operative Plan: Extubation in OR  Informed Consent: I have reviewed the patients History and Physical, chart, labs and discussed the procedure including the risks, benefits and alternatives for the proposed anesthesia with the patient or authorized representative who has indicated his/her understanding and acceptance.     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  Echo 12/2023 showed LVEF 55 to 60%, moderate asymmetric LVH of the basal septal segment, grade 1 DD, normal RV systolic function, no significant valvular abnormalities.  Last seen by Dr. Ladona 06/03/2024, discussed he would need stress test for risk stratification prior to surgery.  Nuclear stress 06/18/2024 was normal, low risk.  Dr. Ladona cleared patient for surgery and later date 1325 stating, Lindsey Hommel is at low risk, from a cardiac standpoint, for his upcoming procedure: Left shoulder reverse arthroplasty.  It is ok to proceed without further cardiac testing. I prefer to continue aspirin  81 mg periprocedurally unless absolutely necessary, you can hold aspirin  for 7 days and restart immediately when stable from surgical standpoint.   Other pertinent history includes IDDM2 (A1c 7.0 on 06/10/2024), hiatal hernia, hypothyroidism, s/p implanted spinal cord stimulator at approximately T8-9 level.  Preop labs reviewed, mild thrombocytopenia with platelets 133, otherwise unremarkable.  EKG 11/22/2023: Normal sinus rhythm at the rate of 68 bpm, left anterior fascicular block, right bundle branch block. Bifascicular block. No significant change  Nuclear stress 06/18/2024:   The study is normal. Findings are consistent with no ischemia. The study is low risk.   No ST deviation was noted. The ECG was not diagnostic due to pharmacologic protocol.   LV perfusion is abnormal. There is no evidence of ischemia. There is no evidence of infarction. Defect 1: There is a small defect with mild reduction in  uptake present in the apical apex location(s) that is fixed. There is normal wall motion in the defect area. Consistent with artifact.   Left ventricular function is abnormal. Global function is mildly reduced. Nuclear stress EF: 53%. The left ventricular  ejection fraction is mildly decreased (45-54%). End diastolic cavity size is normal. End systolic cavity size is normal.   CT images were obtained for attenuation correction and were examined for the presence of coronary calcium when appropriate.   Coronary calcium was present on the attenuation correction CT images. Moderate coronary calcifications were present. Coronary calcifications were present in the left anterior descending artery, left circumflex artery and right coronary artery distribution(s).   Prior study not available for comparison.  ECHOCARDIOGRAM COMPLETE 12/11/2023  1. Left ventricular ejection fraction, by estimation, is 55 to 60%. Left ventricular ejection fraction by 3D volume is 57 %. The left ventricle has normal function. The left ventricle has no regional wall motion abnormalities. There is moderate asymmetric left ventricular hypertrophy of the basal-septal segment. Left ventricular diastolic parameters are consistent with Grade I diastolic dysfunction (impaired relaxation). 2. Right ventricular systolic function is normal. The right ventricular size is mildly enlarged. There is normal pulmonary artery systolic pressure. The estimated right ventricular systolic pressure is 29.0 mmHg. 3. Left atrial size was mildly dilated. 4. Right atrial size was mildly dilated. 5. The mitral valve is grossly normal. Trivial mitral valve regurgitation. No evidence of mitral stenosis. 6. The aortic valve is tricuspid. There is moderate calcification of the aortic valve. There is moderate thickening of the aortic valve. Aortic valve regurgitation is not visualized. Aortic valve sclerosis/calcification is present, without any evidence of aortic stenosis. 7. The inferior vena cava is normal in size with greater than 50% respiratory variability, suggesting right atrial pressure of 3 mmHg.   Zio patch extended EKG monitoring 12 days starting 11/22/2023: Predominant rhythm was normal sinus rhythm.   Minimum heart rate 59 bpm at 12:13 PM and maximum 138 bpm at 9:30 PM.  Average heart rate 78 bpm. There were 574 SVT episodes.  Longest 1 minute and 42 seconds with a average heartbeat of 93 bpm.  EKG reveals brief atrial tachycardia and PACs.  Rare PACs, couplets and atrial triplets were noted.  There was no atrial fibrillation. Isolated PVCs (1.4% burden) and rare ventricular couplets and triplets were present.  Ventricular bigeminy was present. 2 NSVT episodes, longest 5 beats, 4 beat episode at 5 PM and 5 beat episode at 5:30 AM. No heart block. Patient triggered/diary events reveal PACs and PVCs    )         Anesthesia Quick Evaluation

## 2024-07-14 NOTE — Progress Notes (Addendum)
 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  Echo 12/2023 showed LVEF 55 to 60%, moderate asymmetric LVH of the basal septal segment, grade 1 DD, normal RV systolic function, no significant valvular abnormalities.  Last seen by Dr. Ladona 06/03/2024, discussed he would need stress test for risk stratification prior to surgery.  Nuclear stress 06/18/2024 was normal, low risk.  Dr. Ladona cleared patient for surgery and later date 1325 stating, Jaime [name] is at low risk, from a cardiac standpoint, for his upcoming procedure: Left shoulder reverse arthroplasty.  It is ok to proceed without further cardiac testing. I prefer to continue aspirin  81 mg periprocedurally unless absolutely necessary, you can hold aspirin  for 7 days and restart immediately when stable from surgical standpoint.   Other pertinent history includes IDDM2 (A1c 7.0 on 06/10/2024), hiatal hernia, hypothyroidism, s/p implanted spinal cord stimulator at approximately T8-9 level.  Preop labs reviewed, mild thrombocytopenia with platelets 133, otherwise unremarkable.  EKG 11/22/2023: Normal sinus rhythm at the rate of 68 bpm, left anterior fascicular block, right bundle branch block. Bifascicular block. No significant change  Nuclear stress 06/18/2024:   The study is normal. Findings are consistent with no ischemia. The study is low risk.   No ST deviation was noted. The ECG was not diagnostic due to pharmacologic protocol.   LV perfusion is abnormal. There is no evidence of ischemia. There is no evidence of infarction. Defect 1: There is a small defect with mild reduction in uptake present in the apical apex location(s) that is fixed. There is normal wall motion in the defect area.  Consistent with artifact.   Left ventricular function is abnormal. Global function is mildly reduced. Nuclear stress EF: 53%. The left ventricular ejection fraction is mildly decreased (45-54%). End diastolic cavity size is normal. End systolic cavity size is normal.   CT images were obtained for attenuation correction and were examined for the presence of coronary calcium when appropriate.   Coronary calcium was present on the attenuation correction CT images. Moderate coronary calcifications were present. Coronary calcifications were present in the left anterior descending artery, left circumflex artery and right coronary artery distribution(s).   Prior study not available for comparison.  ECHOCARDIOGRAM COMPLETE 12/11/2023  1. Left ventricular ejection fraction, by estimation, is 55 to 60%. Left ventricular ejection fraction by 3D volume is 57 %. The left ventricle has normal function. The left ventricle has no regional wall motion abnormalities. There is moderate asymmetric left ventricular hypertrophy of the basal-septal segment. Left ventricular diastolic parameters are consistent with Grade I diastolic dysfunction (impaired relaxation). 2. Right ventricular systolic function is normal. The right ventricular size is mildly enlarged. There is normal pulmonary artery systolic pressure. The estimated right ventricular systolic pressure is 29.0 mmHg. 3. Left atrial size was mildly dilated. 4. Right atrial size was mildly dilated. 5. The mitral valve is grossly normal. Trivial mitral valve regurgitation. No evidence of mitral stenosis. 6. The aortic valve is tricuspid. There is moderate calcification of the aortic valve. There is moderate thickening of the aortic valve. Aortic valve regurgitation is not visualized. Aortic valve sclerosis/calcification is present, without any evidence of aortic stenosis. 7. The inferior vena cava is normal in size with greater than 50% respiratory variability,  suggesting right atrial pressure of 3 mmHg.  Zio patch extended EKG monitoring 12 days starting 11/22/2023: Predominant rhythm was normal sinus rhythm.  Minimum heart rate 59 bpm at 12:13 PM and maximum 138 bpm at 9:30 PM.  Average heart rate 78 bpm. There were 574 SVT episodes.  Longest 1 minute and 42 seconds with a average heartbeat of 93 bpm.  EKG reveals brief atrial tachycardia and PACs.  Rare PACs, couplets and atrial triplets were noted.  There was no atrial fibrillation. Isolated PVCs (1.4% burden) and rare ventricular couplets and triplets were present.  Ventricular bigeminy was present. 2 NSVT episodes, longest 5 beats, 4 beat episode at 5 PM and 5 beat episode at 5:30 AM. No heart block. Patient triggered/diary events reveal PACs and PVCs     Lynwood Geofm [name] [facility] Short Stay Center/Anesthesiology Phone [phone] 07/14/2024 9:51 AM

## 2024-07-15 ENCOUNTER — Other Ambulatory Visit: Payer: Self-pay

## 2024-07-15 ENCOUNTER — Encounter (HOSPITAL_COMMUNITY): Payer: Self-pay | Admitting: Orthopedic Surgery

## 2024-07-15 ENCOUNTER — Encounter (HOSPITAL_COMMUNITY)
Admission: RE | Disposition: A | Discharge status: Home / Self Care | Payer: Self-pay | Source: Home / Self Care | Attending: Orthopedic Surgery

## 2024-07-15 ENCOUNTER — Observation Stay (HOSPITAL_COMMUNITY)
Admission: RE | Admit: 2024-07-15 | Discharge: 2024-07-16 | Disposition: A | Discharge status: Home / Self Care | Attending: Orthopedic Surgery | Admitting: Orthopedic Surgery

## 2024-07-15 ENCOUNTER — Observation Stay (HOSPITAL_COMMUNITY)

## 2024-07-15 ENCOUNTER — Ambulatory Visit (HOSPITAL_BASED_OUTPATIENT_CLINIC_OR_DEPARTMENT_OTHER): Payer: Self-pay | Admitting: Anesthesiology

## 2024-07-15 ENCOUNTER — Ambulatory Visit (HOSPITAL_COMMUNITY): Payer: Self-pay | Admitting: Physician Assistant

## 2024-07-15 DIAGNOSIS — I10 Essential (primary) hypertension: Secondary | ICD-10-CM | POA: Diagnosis not present

## 2024-07-15 DIAGNOSIS — Z87891 Personal history of nicotine dependence: Secondary | ICD-10-CM | POA: Diagnosis not present

## 2024-07-15 DIAGNOSIS — I2510 Atherosclerotic heart disease of native coronary artery without angina pectoris: Secondary | ICD-10-CM | POA: Insufficient documentation

## 2024-07-15 DIAGNOSIS — E039 Hypothyroidism, unspecified: Secondary | ICD-10-CM | POA: Insufficient documentation

## 2024-07-15 DIAGNOSIS — Z7982 Long term (current) use of aspirin: Secondary | ICD-10-CM | POA: Diagnosis not present

## 2024-07-15 DIAGNOSIS — Z96612 Presence of left artificial shoulder joint: Secondary | ICD-10-CM | POA: Diagnosis not present

## 2024-07-15 DIAGNOSIS — F1090 Alcohol use, unspecified, uncomplicated: Secondary | ICD-10-CM | POA: Insufficient documentation

## 2024-07-15 DIAGNOSIS — M19012 Primary osteoarthritis, left shoulder: Secondary | ICD-10-CM

## 2024-07-15 DIAGNOSIS — Z01818 Encounter for other preprocedural examination: Principal | ICD-10-CM

## 2024-07-15 DIAGNOSIS — E119 Type 2 diabetes mellitus without complications: Secondary | ICD-10-CM | POA: Insufficient documentation

## 2024-07-15 DIAGNOSIS — Z794 Long term (current) use of insulin: Secondary | ICD-10-CM | POA: Insufficient documentation

## 2024-07-15 HISTORY — PX: REVERSE SHOULDER ARTHROPLASTY: SHX5054

## 2024-07-15 LAB — GLUCOSE, CAPILLARY
Glucose-Capillary: 102 mg/dL — ABNORMAL HIGH (ref 70–99)
Glucose-Capillary: 105 mg/dL — ABNORMAL HIGH (ref 70–99)

## 2024-07-15 SURGERY — ARTHROPLASTY, SHOULDER, TOTAL, REVERSE
Anesthesia: General | Site: Shoulder | Laterality: Left

## 2024-07-15 MED ORDER — METOPROLOL SUCCINATE ER 25 MG PO TB24
25.0000 mg | ORAL_TABLET | Freq: Every day | ORAL | Status: DC
  Administered 2024-07-16: 25 mg via ORAL
  Filled 2024-07-15: qty 1

## 2024-07-15 MED ORDER — ASPIRIN 81 MG PO TBEC: 81.0000 mg | DELAYED_RELEASE_TABLET | Freq: Every day | ORAL | Status: DC

## 2024-07-15 MED ORDER — VANCOMYCIN HCL 1000 MG IV SOLR
INTRAVENOUS | Status: DC | PRN
  Administered 2024-07-15: 1000 mg via TOPICAL

## 2024-07-15 MED ORDER — ACETAMINOPHEN 500 MG PO TABS
1000.0000 mg | ORAL_TABLET | Freq: Four times a day (QID) | ORAL | Status: AC
  Administered 2024-07-15 – 2024-07-16 (×4): 1000 mg via ORAL
  Filled 2024-07-15 (×4): qty 2

## 2024-07-15 MED ORDER — EMPAGLIFLOZIN 25 MG PO TABS
25.0000 mg | ORAL_TABLET | Freq: Every day | ORAL | Status: DC
  Administered 2024-07-16: 25 mg via ORAL
  Filled 2024-07-15: qty 1

## 2024-07-15 MED ORDER — VANCOMYCIN HCL 1000 MG IV SOLR
INTRAVENOUS | Status: AC
  Filled 2024-07-15: qty 20

## 2024-07-15 MED ORDER — ORAL CARE MOUTH RINSE: 15.0000 mL | Freq: Once | OROMUCOSAL | Status: AC

## 2024-07-15 MED ORDER — FENTANYL CITRATE (PF) 250 MCG/5ML IJ SOLN
INTRAMUSCULAR | Status: AC
  Filled 2024-07-15: qty 5

## 2024-07-15 MED ORDER — FENTANYL CITRATE (PF) 100 MCG/2ML IJ SOLN
INTRAMUSCULAR | Status: AC
  Filled 2024-07-15: qty 2

## 2024-07-15 MED ORDER — DEXAMETHASONE SODIUM PHOSPHATE 10 MG/ML IJ SOLN
INTRAMUSCULAR | Status: AC
  Filled 2024-07-15: qty 1

## 2024-07-15 MED ORDER — MENTHOL 3 MG MT LOZG: 1.0000 | LOZENGE | OROMUCOSAL | Status: DC | PRN

## 2024-07-15 MED ORDER — DEXAMETHASONE SODIUM PHOSPHATE 10 MG/ML IJ SOLN
INTRAMUSCULAR | Status: DC | PRN
  Administered 2024-07-15: 8 mg via INTRAVENOUS

## 2024-07-15 MED ORDER — PHENYLEPHRINE 80 MCG/ML (10ML) SYRINGE FOR IV PUSH (FOR BLOOD PRESSURE SUPPORT)
PREFILLED_SYRINGE | INTRAVENOUS | Status: DC | PRN
Start: 2024-07-15 — End: 2024-07-15
  Administered 2024-07-15: 80 ug via INTRAVENOUS
  Administered 2024-07-15: 160 ug via INTRAVENOUS

## 2024-07-15 MED ORDER — INSULIN ASPART 100 UNIT/ML IJ SOLN: 0.0000 [IU] | INTRAMUSCULAR | Status: DC | PRN

## 2024-07-15 MED ORDER — ONDANSETRON HCL 4 MG PO TABS: 4.0000 mg | ORAL_TABLET | Freq: Four times a day (QID) | ORAL | Status: DC | PRN

## 2024-07-15 MED ORDER — LIDOCAINE HCL (CARDIAC) PF 100 MG/5ML IV SOSY: PREFILLED_SYRINGE | INTRAVENOUS | Status: DC | PRN

## 2024-07-15 MED ORDER — METOCLOPRAMIDE HCL 5 MG PO TABS: 5.0000 mg | ORAL_TABLET | Freq: Three times a day (TID) | ORAL | Status: DC | PRN

## 2024-07-15 MED ORDER — METOCLOPRAMIDE HCL 5 MG/ML IJ SOLN: 5.0000 mg | Freq: Three times a day (TID) | INTRAMUSCULAR | Status: DC | PRN

## 2024-07-15 MED ORDER — ONDANSETRON HCL 4 MG/2ML IJ SOLN
INTRAMUSCULAR | Status: AC
  Filled 2024-07-15: qty 2

## 2024-07-15 MED ORDER — HYDROMORPHONE HCL 1 MG/ML IJ SOLN
0.5000 mg | INTRAMUSCULAR | Status: DC | PRN
  Administered 2024-07-16: 0.5 mg via INTRAVENOUS
  Filled 2024-07-15: qty 0.5

## 2024-07-15 MED ORDER — SUGAMMADEX SODIUM 200 MG/2ML IV SOLN
INTRAVENOUS | Status: DC | PRN
Start: 2024-07-15 — End: 2024-07-15
  Administered 2024-07-15: 200 mg via INTRAVENOUS

## 2024-07-15 MED ORDER — CHLORHEXIDINE GLUCONATE 0.12 % MT SOLN
15.0000 mL | Freq: Once | OROMUCOSAL | Status: AC
  Administered 2024-07-15: 15 mL via OROMUCOSAL
  Filled 2024-07-15: qty 15

## 2024-07-15 MED ORDER — OXYCODONE HCL 5 MG PO TABS
5.0000 mg | ORAL_TABLET | ORAL | Status: DC | PRN
  Administered 2024-07-15 – 2024-07-16 (×3): 5 mg via ORAL
  Filled 2024-07-15 (×3): qty 1

## 2024-07-15 MED ORDER — LEVOTHYROXINE SODIUM 175 MCG PO TABS
175.0000 ug | ORAL_TABLET | Freq: Every day | ORAL | Status: DC
  Administered 2024-07-16: 175 ug via ORAL
  Filled 2024-07-15: qty 1

## 2024-07-15 MED ORDER — FENTANYL CITRATE (PF) 100 MCG/2ML IJ SOLN: 25.0000 ug | INTRAMUSCULAR | Status: DC | PRN

## 2024-07-15 MED ORDER — METHOCARBAMOL 1000 MG/10ML IJ SOLN: 500.0000 mg | Freq: Four times a day (QID) | INTRAMUSCULAR | Status: DC | PRN

## 2024-07-15 MED ORDER — LIDOCAINE 2% (20 MG/ML) 5 ML SYRINGE
INTRAMUSCULAR | Status: AC
  Filled 2024-07-15: qty 5

## 2024-07-15 MED ORDER — POVIDONE-IODINE 10 % EX SWAB
2.0000 | Freq: Once | CUTANEOUS | Status: AC
  Administered 2024-07-15: 2 via TOPICAL

## 2024-07-15 MED ORDER — CEFAZOLIN SODIUM-DEXTROSE 2-4 GM/100ML-% IV SOLN
2.0000 g | INTRAVENOUS | Status: AC
  Administered 2024-07-15: 2 g via INTRAVENOUS
  Filled 2024-07-15: qty 100

## 2024-07-15 MED ORDER — ONDANSETRON HCL 4 MG/2ML IJ SOLN
INTRAMUSCULAR | Status: DC | PRN
  Administered 2024-07-15: 4 mg via INTRAVENOUS

## 2024-07-15 MED ORDER — PROPOFOL 10 MG/ML IV BOLUS
INTRAVENOUS | Status: DC | PRN
  Administered 2024-07-15: 80 mg via INTRAVENOUS

## 2024-07-15 MED ORDER — TAMSULOSIN HCL 0.4 MG PO CAPS
0.4000 mg | ORAL_CAPSULE | Freq: Every day | ORAL | Status: DC
  Administered 2024-07-16: 0.4 mg via ORAL
  Filled 2024-07-15: qty 1

## 2024-07-15 MED ORDER — LACTATED RINGERS IV SOLN: INTRAVENOUS | Status: DC | PRN

## 2024-07-15 MED ORDER — BUPIVACAINE HCL (PF) 0.5 % IJ SOLN
INTRAMUSCULAR | Status: DC | PRN
  Administered 2024-07-15: 10 mL via PERINEURAL

## 2024-07-15 MED ORDER — DOCUSATE SODIUM 100 MG PO CAPS
100.0000 mg | ORAL_CAPSULE | Freq: Two times a day (BID) | ORAL | Status: DC
  Administered 2024-07-15 – 2024-07-16 (×2): 100 mg via ORAL
  Filled 2024-07-15 (×2): qty 1

## 2024-07-15 MED ORDER — ASPIRIN 81 MG PO TBEC
81.0000 mg | DELAYED_RELEASE_TABLET | Freq: Every day | ORAL | Status: DC
  Administered 2024-07-16: 81 mg via ORAL
  Filled 2024-07-15: qty 1

## 2024-07-15 MED ORDER — ROCURONIUM BROMIDE 100 MG/10ML IV SOLN
INTRAVENOUS | Status: DC | PRN
  Administered 2024-07-15: 40 mg via INTRAVENOUS

## 2024-07-15 MED ORDER — 0.9 % SODIUM CHLORIDE (POUR BTL) OPTIME
TOPICAL | Status: DC | PRN
  Administered 2024-07-15: 3000 mL
  Administered 2024-07-15: 1000 mL

## 2024-07-15 MED ORDER — IRRISEPT - 450ML BOTTLE WITH 0.05% CHG IN STERILE WATER, USP 99.95% OPTIME
TOPICAL | Status: DC | PRN
  Administered 2024-07-15: 450 mL via TOPICAL

## 2024-07-15 MED ORDER — LACTATED RINGERS IV SOLN: INTRAVENOUS | Status: DC

## 2024-07-15 MED ORDER — BUPIVACAINE LIPOSOME 1.3 % IJ SUSP
INTRAMUSCULAR | Status: DC | PRN
  Administered 2024-07-15: 10 mL via PERINEURAL

## 2024-07-15 MED ORDER — GABAPENTIN 300 MG PO CAPS
300.0000 mg | ORAL_CAPSULE | Freq: Every day | ORAL | Status: DC
  Administered 2024-07-15: 300 mg via ORAL
  Filled 2024-07-15: qty 1

## 2024-07-15 MED ORDER — ROCURONIUM BROMIDE 10 MG/ML (PF) SYRINGE
PREFILLED_SYRINGE | INTRAVENOUS | Status: AC
  Filled 2024-07-15: qty 10

## 2024-07-15 MED ORDER — ONDANSETRON HCL 4 MG/2ML IJ SOLN: 4.0000 mg | Freq: Once | INTRAMUSCULAR | Status: DC | PRN

## 2024-07-15 MED ORDER — ACETAMINOPHEN 325 MG PO TABS
650.0000 mg | ORAL_TABLET | Freq: Once | ORAL | Status: AC
  Administered 2024-07-15: 650 mg via ORAL
  Filled 2024-07-15: qty 2

## 2024-07-15 MED ORDER — PROPOFOL 10 MG/ML IV BOLUS
INTRAVENOUS | Status: AC
  Filled 2024-07-15: qty 20

## 2024-07-15 MED ORDER — SODIUM CHLORIDE 0.9 % IV SOLN: INTRAVENOUS | Status: AC

## 2024-07-15 MED ORDER — LIDOCAINE 2% (20 MG/ML) 5 ML SYRINGE
INTRAMUSCULAR | Status: DC | PRN
  Administered 2024-07-15: 60 mg via INTRAVENOUS

## 2024-07-15 MED ORDER — ACETAMINOPHEN 325 MG PO TABS: 325.0000 mg | ORAL_TABLET | Freq: Four times a day (QID) | ORAL | Status: DC | PRN

## 2024-07-15 MED ORDER — FENTANYL CITRATE (PF) 100 MCG/2ML IJ SOLN
INTRAMUSCULAR | Status: DC | PRN
  Administered 2024-07-15 (×2): 50 ug via INTRAVENOUS

## 2024-07-15 MED ORDER — CEFAZOLIN SODIUM-DEXTROSE 2-4 GM/100ML-% IV SOLN
2.0000 g | Freq: Three times a day (TID) | INTRAVENOUS | Status: AC
  Administered 2024-07-15 – 2024-07-16 (×3): 2 g via INTRAVENOUS
  Filled 2024-07-15 (×3): qty 100

## 2024-07-15 MED ORDER — TRAZODONE HCL 50 MG PO TABS
50.0000 mg | ORAL_TABLET | Freq: Every day | ORAL | Status: DC
  Administered 2024-07-15: 50 mg via ORAL
  Filled 2024-07-15: qty 1

## 2024-07-15 MED ORDER — METHOCARBAMOL 500 MG PO TABS: 500.0000 mg | ORAL_TABLET | Freq: Four times a day (QID) | ORAL | Status: DC | PRN

## 2024-07-15 MED ORDER — POVIDONE-IODINE 7.5 % EX SOLN
Freq: Once | CUTANEOUS | Status: AC
  Filled 2024-07-15: qty 118

## 2024-07-15 MED ORDER — PHENOL 1.4 % MT LIQD: 1.0000 | OROMUCOSAL | Status: DC | PRN

## 2024-07-15 MED ORDER — MIRABEGRON ER 50 MG PO TB24
50.0000 mg | ORAL_TABLET | Freq: Every day | ORAL | Status: DC
  Administered 2024-07-16: 50 mg via ORAL
  Filled 2024-07-15: qty 1

## 2024-07-15 MED ORDER — FENTANYL CITRATE (PF) 100 MCG/2ML IJ SOLN
25.0000 ug | INTRAMUSCULAR | Status: DC | PRN
  Administered 2024-07-15: 50 ug via INTRAVENOUS
  Administered 2024-07-15 (×4): 25 ug via INTRAVENOUS

## 2024-07-15 MED ORDER — PHENYLEPHRINE HCL-NACL 20-0.9 MG/250ML-% IV SOLN
INTRAVENOUS | Status: DC | PRN
  Administered 2024-07-15: 30 ug/min via INTRAVENOUS

## 2024-07-15 SURGICAL SUPPLY — 60 items
ALCOHOL 70% 16 OZ (MISCELLANEOUS) ×1 IMPLANT
BAG COUNTER SPONGE SURGICOUNT (BAG) ×1 IMPLANT
BEARING HUMERAL SHLDER 36M STD (Shoulder) IMPLANT
BIT DRILL 2.7 W/STOP DISP (BIT) IMPLANT
BIT DRILL TWIST 2.7 (BIT) IMPLANT
BLADE SAW SGTL 13X75X1.27 (BLADE) ×1 IMPLANT
CHLORAPREP W/TINT 26 (MISCELLANEOUS) ×1 IMPLANT
CLSR STERI-STRIP ANTIMIC 1/2X4 (GAUZE/BANDAGES/DRESSINGS) IMPLANT
COMPONENT RV AUG LG W/TAPR/GLN (Joint) IMPLANT
COOLER ICEMAN CLASSIC (MISCELLANEOUS) ×1 IMPLANT
COVER SURGICAL LIGHT HANDLE (MISCELLANEOUS) ×1 IMPLANT
DRAPE INCISE IOBAN 66X45 STRL (DRAPES) ×1 IMPLANT
DRAPE SURG ORHT 6 SPLT 77X108 (DRAPES) ×2 IMPLANT
DRAPE U-SHAPE 47X51 STRL (DRAPES) ×2 IMPLANT
DRESSING AQUACEL AG SP 3.5X10 (GAUZE/BANDAGES/DRESSINGS) IMPLANT
DRSG AQUACEL AG ADV 3.5X10 (GAUZE/BANDAGES/DRESSINGS) ×1 IMPLANT
ELECTRODE BLDE 4.0 EZ CLN MEGD (MISCELLANEOUS) ×1 IMPLANT
ELECTRODE REM PT RTRN 9FT ADLT (ELECTROSURGICAL) ×1 IMPLANT
GAUZE SPONGE 4X4 12PLY STRL LF (GAUZE/BANDAGES/DRESSINGS) ×1 IMPLANT
GLENOID SPHERE STD STRL 36MM (Orthopedic Implant) IMPLANT
GLOVE BIOGEL PI IND STRL 6.5 (GLOVE) ×1 IMPLANT
GLOVE BIOGEL PI IND STRL 8 (GLOVE) ×1 IMPLANT
GLOVE ECLIPSE 6.5 STRL STRAW (GLOVE) ×1 IMPLANT
GLOVE ECLIPSE 8.0 STRL XLNG CF (GLOVE) ×1 IMPLANT
GOWN STRL REUS W/ TWL LRG LVL3 (GOWN DISPOSABLE) ×1 IMPLANT
GOWN STRL REUS W/ TWL XL LVL3 (GOWN DISPOSABLE) ×1 IMPLANT
GUIDE RSA SHLD BM ROT L SU (ORTHOPEDIC DISPOSABLE SUPPLIES) IMPLANT
HYDROGEN PEROXIDE 16OZ (MISCELLANEOUS) ×1 IMPLANT
KIT BASIN OR (CUSTOM PROCEDURE TRAY) ×1 IMPLANT
KIT TURNOVER KIT B (KITS) ×1 IMPLANT
LAVAGE JET IRRISEPT WOUND (IRRIGATION / IRRIGATOR) ×1 IMPLANT
MANIFOLD NEPTUNE II (INSTRUMENTS) ×1 IMPLANT
NS IRRIG 1000ML POUR BTL (IV SOLUTION) ×1 IMPLANT
PACK SHOULDER (CUSTOM PROCEDURE TRAY) ×1 IMPLANT
PAD COLD SHLDR WRAP-ON (PAD) ×1 IMPLANT
PIN STEINMANN THREADED TIP (PIN) IMPLANT
PIN THREADED REVERSE (PIN) IMPLANT
REAMER GUIDE BUSHING SURG DISP (MISCELLANEOUS) IMPLANT
REAMER GUIDE W/SCREW AUG (MISCELLANEOUS) IMPLANT
RESTRAINT HEAD UNIVERSAL NS (MISCELLANEOUS) ×1 IMPLANT
RETRIEVER SUT HEWSON (MISCELLANEOUS) ×1 IMPLANT
SCREW BONE STRL 6.5MMX30MM (Screw) IMPLANT
SCREW LOCKING 4.75MMX15MM (Screw) IMPLANT
SCREW LOCKING NS 4.75MMX20MM (Screw) IMPLANT
SLING ARM IMMOBILIZER LRG (SOFTGOODS) ×1 IMPLANT
SOL PREP POV-IOD 4OZ 10% (MISCELLANEOUS) ×1 IMPLANT
SPONGE T-LAP 18X18 ~~LOC~~+RFID (SPONGE) ×1 IMPLANT
STEM HUMERAL STRL 13MMX83MM (Stem) IMPLANT
STRIP CLOSURE SKIN 1/2X4 (GAUZE/BANDAGES/DRESSINGS) ×1 IMPLANT
SUCTION TUBE FRAZIER 10FR DISP (SUCTIONS) ×1 IMPLANT
SUT BROADBAND TAPE 2PK 1.5 (SUTURE) IMPLANT
SUT MNCRL AB 3-0 PS2 18 (SUTURE) ×1 IMPLANT
SUT SILK 2 0 TIES 10X30 (SUTURE) ×1 IMPLANT
SUT VIC AB 0 CT1 27XBRD ANBCTR (SUTURE) ×4 IMPLANT
SUT VIC AB 1 CT1 27XBRD ANBCTR (SUTURE) ×2 IMPLANT
SUT VIC AB 2-0 CT2 27 (SUTURE) ×3 IMPLANT
SUT VICRYL 0 UR6 27IN ABS (SUTURE) ×2 IMPLANT
TOWEL GREEN STERILE (TOWEL DISPOSABLE) ×1 IMPLANT
TRAY HUM MINI 40 +5 +6 (Shoulder) IMPLANT
WATER STERILE IRR 1000ML POUR (IV SOLUTION) ×1 IMPLANT

## 2024-07-15 NOTE — Op Note (Signed)
 NAME: Jaime [name], Jaime [name] MEDICAL RECORD NO: [id number] ACCOUNT NO: [id number] DATE OF BIRTH: [birth year] FACILITY: [facility] LOCATION: [facility] PHYSICIAN: Cordella RAMAN. Addie, MD  Operative Report   DATE OF PROCEDURE: 07/15/2024  PREOPERATIVE DIAGNOSES: 1.  Left shoulder arthritis. 2.  Rotator cuff arthropathy.  POSTOPERATIVE DIAGNOSES: 1.  Left shoulder arthritis. 2.  Rotator cuff arthropathy.  PROCEDURE:  Left reverse shoulder replacement using mini humeral stem size 13 x 83 with standard bearing cross-linked polyethylene liner with mini humeral tray, +5 thickness, +6 tapered offset with large augmented baseplate, one central compression screw  and four peripheral locking screws with glenosphere standard 36.  SURGEON:  Cordella RAMAN. Addie, MD  ASSISTANT:  Herlene Calix.  INDICATIONS:  The patient is a 89 year old patient with end-stage arthritis and rotator cuff arthropathy in the left shoulder.  He presents for operative management after explanation of risks and benefits.  DESCRIPTION OF PROCEDURE:  The patient was brought to the operating room where a general endotracheal anesthesia was induced.  Preoperative antibiotics administered.  A timeout was called.  The patient was placed in the beach chair position with the head  in neutral position.  The left shoulder, arm, and hand prescrubbed with hydrogen peroxide followed by alcohol and then Betadine , which was allowed to air dry, then prepped with ChloraPrep solution and draped in a sterile manner.  Ioban used to seal the  operative field and cover the axilla.  After calling timeout, deltopectoral approach was made.  IrriSept solution was utilized.  Cephalic vein mobilized medially.  The interval between the subacromial and subdeltoid adhesions were manually released.  The  anterior portion of the deltoid was elevated off its attachment manually to decrease some of the tension on the deltoid.  The patient had no biceps tendon in the bicipital groove.   Biceps tenodesis was not required.  The patient also had a significant  subscapularis tear.  There was some soft tissue and bursal tissue attached to the lesser tuberosity, which was detached and sutured and used for traction during dislocation and relocation.  At this time, the axillary nerve was palpated and found to be  intact.  Circumflex vessels were then ligated.  Next, the remnant capsule and subscapularis inferiorly were detached from the inferior humeral neck about 2 cm below the humeral neck.  Then using a Cobb elevator and a sponge, dissection was performed  around the 7 o'clock position in order to free up the humerus to mobilize the humerus posteriorly.  Next, the Kolbel retractor was removed.  The head was dislocated and the [facility] retractor was placed.  Reaming was then performed up to a size 13.  The  head was then cut in 30 degrees of retroversion.  Broaching then performed up to a size 13, which gave a very nice fit in the humerus.  A cap was placed and then attention was directed towards the glenoid.  Posterior and anterior retractors were placed  with the patient under muscle paralytic reversal.  The labrum was excised and the capsule was detached from the 12 o'clock to 6 o'clock position.  Guide was placed.  Guide pins were placed.  Reaming was then performed in accordance with preoperative  templating.  Reaming was then performed for the augment.  Baseplate trial was then placed, which had 100% contact.  After irrigating with IrriSept solution, the baseplate was placed with one central compression screw and four peripheral locking screws,  which achieved very good compression and fixation.  Next, trial reduction was performed  with multiple combinations.  In general, the 36 standard glenosphere was the largest implant we could get into the space of the shoulder.  That was then the optimal  implant on the humeral side was the +6 offset tray with +5 thickness tray matched with a standard  bearing liner.  This combination gave very good stability to extension and adduction.  Internal and external rotation at 90 degrees of abduction was also  stable.  Not too much tension on the deltoid or the conjoined tendon.  Axillary nerve intact.  Trial components were then removed.  We did place two SutureTapes in the lesser tuberosity to reattach some of that bursal tissue just to form a tissue bridge  across that gap in the anterior aspect of the shoulder.  The glenosphere was placed and the humerus was placed with good fixation achieved.  We then retrialed with the +6 offset tray with 5 mm additional offset and the standard liner.  This gave the same  stability parameters and that was placed.  Both the glenosphere and the tray were placed onto the dried Morse taper with good fit obtained.  Same stability parameters were maintained.  Difficult to reduce, difficult to relocate.  Two-finger tight.   Thorough irrigation with 3 Liter of irrigating solution was then performed at this time.  The soft tissue bursal tissue was then reapproximated with the arm in 30 degrees of external rotation.  At this time, IrriSept solution was utilized and vancomycin   powder was placed on the implant.  The deltopectoral interval was then closed using #1 Vicryl suture followed by interrupted inverted 0 Vicryl suture, 2-0 Vicryl suture, and 3-0 Monocryl.  Steri-Strips and Aquacel dressing placed.  The patient's skin was  fairly friable proximally.  The patient was placed in a shoulder sling.  He tolerated the procedure well without immediate complications.  Luke's assistance was required for opening and closing mobilization of tissue.  His assistance was of medical necessity.   PUS D: 07/15/2024 10:58:36 am T: 07/15/2024 12:17:00 pm  JOB: [id number]/ [id number]

## 2024-07-15 NOTE — H&P (Signed)
 Jaime [name] is an 89 y.o. male.   Chief Complaint: left shoulder pain HPI: Jaime [name] is a 89 y.o. male who presents to the office reporting continued bilateral and severe shoulder pain left worse than right.  Reports fairly constant pain in that left shoulder which wakes him from sleep at night.  Has had injections in both shoulders in the past which have helped.  Wants to proceed with surgical intervention.  He has seen cardiology for risk stratification and optimization prior to surgery.   He does take care of his wife who has MS but that primarily involves not a lot of lifting but more helping her with activities of daily living   Past Medical History:  Diagnosis Date   Arthritis    Coronary artery disease 06/04/2009   stenting to the mid LAD with Xience 2.5 x 32 and overlapping 2.5 x 12 mm DES, [place], [place]   Diabetes mellitus without complication (HCC)    Hyperlipidemia    Hypertension    Hypothyroidism    Retinal artery occlusion     Past Surgical History:  Procedure Laterality Date   CARDIAC CATHETERIZATION  06/04/2009   Stent to LAD in [place], [place]   REPLACEMENT TOTAL KNEE     bOTH   ROTATOR CUFF REPAIR Bilateral     Family History  Problem Relation Age of Onset   Leukemia Mother    Heart attack Brother    Lung cancer Brother    Social History:  reports that he quit smoking about 75 years ago. His smoking use included cigarettes. He started smoking about 85 years ago. He has a 10 pack-year smoking history. He has never used smokeless tobacco. He reports current alcohol use. He reports that he does not use drugs.  Allergies: No Known Allergies  Medications Prior to Admission  Medication Sig Dispense Refill   aspirin  EC 81 MG tablet Take 81 mg by mouth daily. Swallow whole.     gabapentin  (NEURONTIN ) 300 MG capsule Take 1 capsule (300 mg total) by mouth at bedtime. 90 capsule 1   ibuprofen (ADVIL) 800 MG tablet Take 800 mg by mouth in the morning.      JARDIANCE  25 MG TABS tablet Take 25 mg by mouth daily.     latanoprost (XALATAN) 0.005 % ophthalmic solution Place 1 drop into the left eye at bedtime.     levothyroxine  (SYNTHROID ) 175 MCG tablet Take 175 mcg by mouth daily before breakfast.     metoprolol  succinate (TOPROL -XL) 25 MG 24 hr tablet Take 25 mg by mouth daily.     mirabegron  ER (MYRBETRIQ ) 50 MG TB24 tablet Take 50 mg by mouth daily.     Multiple Vitamin (MULTIVITAMIN WITH MINERALS) TABS tablet Take 1 tablet by mouth daily.     oxymetazoline (AFRIN) 0.05 % nasal spray Place 1 spray into both nostrils daily as needed for congestion.     rosuvastatin (CRESTOR) 20 MG tablet Take 20 mg by mouth daily.     silodosin (RAPAFLO) 8 MG CAPS capsule Take 8 mg by mouth daily with breakfast.     traZODone  (DESYREL ) 50 MG tablet Take 50 mg by mouth at bedtime.     TRESIBA FLEXTOUCH 100 UNIT/ML FlexTouch Pen Inject 17 Units into the skin daily.     Vitamin D, Cholecalciferol, 50 MCG (2000 UT) CAPS Take 2,000 Units by mouth daily.     vitamin E 180 MG (400 UNITS) capsule Take 400 Units by mouth daily.  nitroGLYCERIN  (NITROSTAT ) 0.4 MG SL tablet Place 1 tablet (0.4 mg total) under the tongue every 5 (five) minutes as needed for up to 25 days for chest pain. 25 tablet 3    No results found for this or any previous visit (from the past 48 hours). No results found.  Review of Systems  Musculoskeletal:  Positive for arthralgias.  All other systems reviewed and are negative.   There were no vitals taken for this visit. Physical Exam Vitals reviewed.  HENT:     Head: Normocephalic.     Nose: Nose normal.     Mouth/Throat:     Mouth: Mucous membranes are moist.  Eyes:     Pupils: Pupils are equal, round, and reactive to light.  Cardiovascular:     Rate and Rhythm: Normal rate. Rhythm irregular.     Pulses: Normal pulses.  Pulmonary:     Effort: Pulmonary effort is normal.  Abdominal:     General: Abdomen is flat.  Musculoskeletal:      Cervical back: Normal range of motion.  Skin:    General: Skin is warm.     Capillary Refill: Capillary refill takes less than 2 seconds.  Neurological:     General: No focal deficit present.     Mental Status: He is alert.  Psychiatric:        Mood and Affect: Mood normal.    Ortho exam demonstrates functional deltoid on both sides. He has less than 90 degrees of forward flexion abduction with external rotation weakness on the left. Subscap strength is about 4+ out of 5 on the left. Coarseness and grinding is present consistent with his known diagnosis of rotator cuff arthropathy. Motor or sensory function to the hands intact. Deltoid functional on left  Assessment/Plan  Impression is end-stage rotator cuff arthropathy in both shoulders.  Right shoulder injected with cortisone/Kenalog  6 weeks ago   Left shoulder injected with Toradol.6 weeks ago.  CT for pre op planning performed. The risk and benefits are discussed with the patient include not limited to infection nerve vessel damage incomplete pain relief as well as incomplete functional restoration.  The stress of elective joint replacement is also discussed.  He will be optimized by his medical physicians prior to the surgery.  Patient understands risk and benefits.  We have also discussed.  Anticipate out of sling at 2 weeks with no lifting more than 20 pounds after 6 weeks.  Home health occupational therapy will be utilized in the first 2 weeks with outpatient therapy thereafter.   KANDICE Glendia Hutchinson, MD 07/15/2024, 6:04 AM

## 2024-07-15 NOTE — Anesthesia Procedure Notes (Signed)
 Procedure Name: Intubation Date/Time: 07/15/2024 7:46 AM  Performed by: Deeann Eva BROCKS, CRNAPre-anesthesia Checklist: Patient identified, Emergency Drugs available, Suction available and Patient being monitored Patient Re-evaluated:Patient Re-evaluated prior to induction Oxygen Delivery Method: Circle System Utilized Preoxygenation: Pre-oxygenation with 100% oxygen Induction Type: IV induction Ventilation: Mask ventilation without difficulty Laryngoscope Size: Mac and 3 Grade View: Grade I Tube type: Oral Tube size: 7.5 mm Number of attempts: 1 Airway Equipment and Method: Stylet and Oral airway Placement Confirmation: ETT inserted through vocal cords under direct vision, positive ETCO2 and breath sounds checked- equal and bilateral Secured at: 22 cm Tube secured with: Tape Dental Injury: Teeth and Oropharynx as per pre-operative assessment

## 2024-07-15 NOTE — Transfer of Care (Signed)
 Immediate Anesthesia Transfer of Care Note  Patient: Jaime [name]  Procedure(s) Performed: ARTHROPLASTY, SHOULDER, TOTAL, REVERSE (Left: Shoulder)  Patient Location: PACU  Anesthesia Type:General and Regional  Level of Consciousness: awake and alert   Airway & Oxygen Therapy: Patient Spontanous Breathing and Patient connected to face mask oxygen  Post-op Assessment: Report given to RN and Post -op Vital signs reviewed and stable  Post vital signs: Reviewed and stable  Last Vitals:  Vitals Value Taken Time  BP 137/71 07/15/24 10:30  Temp    Pulse 70 07/15/24 10:35  Resp 18 07/15/24 10:35  SpO2 96 % 07/15/24 10:35  Vitals shown include unfiled device data.  Last Pain:  Vitals:   07/15/24 0642  TempSrc:   PainSc: 0-No pain         Complications: No notable events documented.

## 2024-07-15 NOTE — Anesthesia Procedure Notes (Signed)
 Anesthesia Regional Block: Interscalene brachial plexus block   Pre-Anesthetic Checklist: , timeout performed,  Correct Patient, Correct Site, Correct Laterality,  Correct Procedure, Correct Position, site marked,  Risks and benefits discussed,  Surgical consent,  Pre-op evaluation,  At surgeon's request and post-op pain management  Laterality: Left  Prep: chloraprep       Needles:  Injection technique: Single-shot  Needle Type: Echogenic Stimulator Needle     Needle Length: 9cm  Needle Gauge: 22     Additional Needles:   Procedures:,,,, ultrasound used (permanent image in chart),,    Narrative:  Start time: 07/15/2024 6:55 AM End time: 07/15/2024 7:03 AM Injection made incrementally with aspirations every 5 mL.  Performed by: Personally  Anesthesiologist: Corinne Garnette BRAVO, MD  Additional Notes: Functioning IV was confirmed and monitors were applied.  A 90mm 22ga echogenic stimulator needle was used. Sterile prep and drape, hand hygiene, and sterile gloves were used.  Negative aspiration and negative test dose prior to incremental administration of local anesthetic. The patient tolerated the procedure well.  Ultrasound guidance: relevent anatomy identified, needle position confirmed, local anesthetic spread visualized around nerve(s), vascular puncture avoided.  Image printed for medical record.

## 2024-07-15 NOTE — Brief Op Note (Signed)
   07/15/2024  10:50 AM  PATIENT:  Jaime [name]  89 y.o. male  PRE-OPERATIVE DIAGNOSIS:  left shoulder osteoarthritis  POST-OPERATIVE DIAGNOSIS:  left shoulder osteoarthritis  PROCEDURE:  Procedure(s): ARTHROPLASTY, SHOULDER, TOTAL, REVERSE  SURGEON:  Surgeon(s): Addie, Cordella Hamilton, MD  ASSISTANT: magnant pa  ANESTHESIA:   general  EBL: 50 ml    Total I/O In: 973.2 [I.V.:873.2; IV Piggyback:100] Out: 75 [Blood:75]  BLOOD ADMINISTERED: none  DRAINS: none   LOCAL MEDICATIONS USED:  none  SPECIMEN:  No Specimen  COUNTS:  YES  TOURNIQUET:  * No tourniquets in log *  DICTATION: .Other Dictation: Dictation Number [id number]  PLAN OF CARE: Admit for overnight observation  PATIENT DISPOSITION:  PACU - hemodynamically stable

## 2024-07-16 ENCOUNTER — Encounter (HOSPITAL_COMMUNITY): Payer: Self-pay | Admitting: Orthopedic Surgery

## 2024-07-16 DIAGNOSIS — M19012 Primary osteoarthritis, left shoulder: Secondary | ICD-10-CM | POA: Diagnosis not present

## 2024-07-16 MED ORDER — METHOCARBAMOL 500 MG PO TABS: 500.0000 mg | ORAL_TABLET | Freq: Three times a day (TID) | ORAL | 0 refills | Status: AC | PRN

## 2024-07-16 MED ORDER — ACETAMINOPHEN 325 MG PO TABS: 325.0000 mg | ORAL_TABLET | Freq: Four times a day (QID) | ORAL | 0 refills | Status: AC | PRN

## 2024-07-16 MED ORDER — OXYCODONE HCL 5 MG PO TABS: 5.0000 mg | ORAL_TABLET | ORAL | 0 refills | Status: DC | PRN

## 2024-07-16 MED ORDER — DOCUSATE SODIUM 100 MG PO CAPS: 100.0000 mg | ORAL_CAPSULE | Freq: Two times a day (BID) | ORAL | 0 refills | Status: AC

## 2024-07-16 NOTE — Anesthesia Postprocedure Evaluation (Signed)
 Anesthesia Post Note  Patient: Sim Datta  Procedure(s) Performed: ARTHROPLASTY, SHOULDER, TOTAL, REVERSE (Left: Shoulder)     Patient location during evaluation: PACU Anesthesia Type: General Level of consciousness: awake and alert Pain management: pain level controlled Vital Signs Assessment: post-procedure vital signs reviewed and stable Respiratory status: spontaneous breathing, nonlabored ventilation and respiratory function stable Cardiovascular status: blood pressure returned to baseline and stable Postop Assessment: no apparent nausea or vomiting Anesthetic complications: no   No notable events documented.  Last Vitals:  Vitals:   07/16/24 0300 07/16/24 0745  BP: 130/61 (!) 108/51  Pulse: 72 69  Resp: 18 16  Temp: 36.4 C 36.6 C  SpO2: 95% 97%    Last Pain:  Vitals:   07/16/24 0745  TempSrc: Oral  PainSc:    Pain Goal:                   Garnette FORBES Skillern

## 2024-07-16 NOTE — Evaluation (Signed)
 Occupational Therapy Evaluation Patient Details Name: Jaime [name] MRN: [id number] DOB: Jun 25, 1931 Today's Date: 07/16/2024   History of Present Illness   Pt is a 89 y.o. male s/p scheduled L reverse shoulder arthroplasty. PMH: arthritis, CAD, DM, HLD, HTN, hypothyroidism, retinal artery occlusion, bil TKA, bil rotator cuff repair.     Clinical Impressions PTA, pt lived with wife whom he assisted with ADL and was independent with ADL, IADL, caregiver duties, driving. Upon eval, pt performing UB ADL with set-up and LB ADL with set-up to supervision. Pt educated and demonstrating use of compensatory techniques for UB bathing, dressing, sling application/positioning/wear schedule, LB ADL, sleep positioning and grooming. Reviewed and pt demonstrating elbow/wrist/hand exercises and understanding of NWB/no ROM of shoulder precautions as outlined by physician orders. Recommending follow physician orders for discharge therapies per physician's shoulder protocol.       If plan is discharge home, recommend the following:   Assistance with cooking/housework;Assist for transportation;Help with stairs or ramp for entrance     Functional Status Assessment   Patient has had a recent decline in their functional status and demonstrates the ability to make significant improvements in function in a reasonable and predictable amount of time.     Equipment Recommendations   None recommended by OT     Recommendations for Other Services         Precautions/Restrictions   Precautions Precautions: Shoulder Type of Shoulder Precautions: NWB, AROM to elbow/wrist/hand, no ROM shoulder with exception of ER 0-30; sling all times ADL/exercise Shoulder Interventions: Shoulder sling/immobilizer;At all times;Off for dressing/bathing/exercises Precaution Booklet Issued: Yes (comment) Recall of Precautions/Restrictions: Intact Required Braces or Orthoses: Sling Restrictions Weight Bearing  Restrictions Per Provider Order: Yes LUE Weight Bearing Per Provider Order: Non weight bearing     Mobility Bed Mobility               General bed mobility comments: OOB in chair, discussed positioning for sleep    Transfers Overall transfer level: Modified independent                        Balance                                           ADL either performed or assessed with clinical judgement   ADL Overall ADL's : Needs assistance/impaired Eating/Feeding: Modified independent   Grooming: Set up;Standing   Upper Body Bathing: Set up;Sitting   Lower Body Bathing: Supervison/ safety;Sit to/from stand   Upper Body Dressing : Set up;Sitting   Lower Body Dressing: Supervision/safety;Sit to/from stand   Toilet Transfer: Supervision/safety;Ambulation   Toileting- Clothing Manipulation and Hygiene: Supervision/safety   Tub/ Shower Transfer: Supervision/safety;Walk-in shower   Functional mobility during ADLs: Supervision/safety       Vision Baseline Vision/History: 1 Wears glasses Ability to See in Adequate Light: 0 Adequate Patient Visual Report: No change from baseline Vision Assessment?: No apparent visual deficits     Perception         Praxis         Pertinent Vitals/Pain Pain Assessment Pain Assessment: Faces Faces Pain Scale: Hurts a little bit Pain Location: surgical Pain Descriptors / Indicators: Sore, Guarding Pain Intervention(s): Limited activity within patient's tolerance, Monitored during session     Extremity/Trunk Assessment Upper Extremity Assessment Upper Extremity Assessment: Right hand dominant;LUE deficits/detail LUE Deficits / Details: wrist/hand function  WFL with mild numbness in first and second distal digits. decr supination to neutral, with inability to supinate past neutral. elbow flexion normal. per MD order only shoulder ER ok to 30 degrees and pt performing with increased time. LUE Sensation:  decreased light touch LUE Coordination: decreased gross motor   Lower Extremity Assessment Lower Extremity Assessment: Overall WFL for tasks assessed       Communication Communication Communication: No apparent difficulties   Cognition Arousal: Alert Behavior During Therapy: WFL for tasks assessed/performed Cognition: No apparent impairments                               Following commands: Intact       Cueing  General Comments   Cueing Techniques: Verbal cues      Exercises Exercises: Shoulder Shoulder Exercises Pendulum Exercise: PROM, 10 reps, Seated, Standing Elbow Flexion: AROM, 10 reps, Left Wrist Flexion: AROM, Left, 10 reps Wrist Extension: AROM, Left, 10 reps Digit Composite Flexion: AROM, Left, 10 reps Composite Extension: AROM, Left, 10 reps Neck Flexion: AROM, 10 reps Neck Extension: AROM, 10 reps Neck Lateral Flexion - Right: AROM, 10 reps Neck Lateral Flexion - Left: AROM, 10 reps   Shoulder Instructions Shoulder Instructions Donning/doffing shirt without moving shoulder: Set-up Method for sponge bathing under operated UE: Set-up Donning/doffing sling/immobilizer: Set-up Correct positioning of sling/immobilizer: Set-up Pendulum exercises (written home exercise program): Supervision/safety (cues for technique) ROM for elbow, wrist and digits of operated UE: Independent Sling wearing schedule (on at all times/off for ADL's): Independent Proper positioning of operated UE when showering: Set-up Positioning of UE while sleeping:  (reviewed)    Home Living Family/patient expects to be discharged to:: Private residence Living Arrangements: Spouse/significant other Available Help at Discharge: Family;Personal care attendant (CNA to stay 2 weeks) Type of Home: Apartment Home Access: Level entry     Home Layout: One level     Bathroom Shower/Tub: Walk-in shower (0 entry)   Firefighter: Handicapped height     Home Equipment:  Agricultural consultant (2 wheels);BSC/3in1;Grab bars - toilet;Grab bars - tub/shower          Prior Functioning/Environment Prior Level of Function : Independent/Modified Independent;Driving               ADLs Comments: indep in ADL, IADL, driving, assists wife with BADL, very occasional lifting assist    OT Problem List: Decreased strength;Decreased range of motion;Decreased knowledge of precautions;Impaired UE functional use   OT Treatment/Interventions:        OT Goals(Current goals can be found in the care plan section)   Acute Rehab OT Goals Patient Stated Goal: get better so I can care for my wife OT Goal Formulation: With patient Time For Goal Achievement: 07/30/24 Potential to Achieve Goals: Good   OT Frequency:       Co-evaluation              AM-PAC OT 6 Clicks Daily Activity     Outcome Measure Help from another person eating meals?: None Help from another person taking care of personal grooming?: A Little Help from another person toileting, which includes using toliet, bedpan, or urinal?: A Little Help from another person bathing (including washing, rinsing, drying)?: A Little Help from another person to put on and taking off regular upper body clothing?: A Little Help from another person to put on and taking off regular lower body clothing?: A Little 6 Click Score: 19  End of Session Equipment Utilized During Treatment: Other (comment) (sling) Nurse Communication: Mobility status  Activity Tolerance: Patient tolerated treatment well Patient left: in chair;with call bell/phone within reach  OT Visit Diagnosis: Muscle weakness (generalized) (M62.81)                Time: 0915-1000 OT Time Calculation (min): 45 min Charges:  OT General Charges $OT Visit: 1 Visit OT Evaluation $OT Eval Low Complexity: 1 Low OT Treatments $Self Care/Home Management : 23-37 mins  Elma JONETTA Lebron FREDERICK, OTR/L [facility] Acute Rehabilitation Office:  [phone]   Elma JONETTA Lebron 07/16/2024, 1:40 PM

## 2024-07-16 NOTE — Plan of Care (Signed)

## 2024-07-16 NOTE — TOC Initial Note (Signed)
 Transition of Care (TOC) - Initial/Assessment Note   Patient from home with wife.   Patient independent prior to admission, did not use any assistive devices . Patient states his wife has DME.   Dr Addie office arranged home health PT/OT with [facility]. Patient in agreement. Confirmed with Burnard with [facility].   Await PT/OT eval Patient Details  Name: Jaime [name] MRN: [id number] Date of Birth: 01-07-1931  Transition of Care [facility]) CM/SW Contact:    Stephane Powell Jansky, RN Phone Number: 07/16/2024, 10:55 AM  Clinical Narrative:                   Expected Discharge Plan: Home w Home Health Services Barriers to Discharge: Continued Medical Work up   Patient Goals and CMS Choice Patient states their goals for this hospitalization and ongoing recovery are:: to return to home CMS Medicare.gov Compare Post Acute Care list provided to:: Patient Choice offered to / list presented to : Patient      Expected Discharge Plan and Services   Discharge Planning Services: CM Consult Post Acute Care Choice: Home Health Living arrangements for the past 2 months: Single Family Home                 DME Arranged: N/A         HH Arranged: PT, OT HH Agency: [facility] Date HH Agency Contacted: 07/16/24 Time HH Agency Contacted: 1055 Representative spoke with at [facility] Agency: Burnard  Prior Living Arrangements/Services Living arrangements for the past 2 months: Single Family Home Lives with:: Spouse Patient language and need for interpreter reviewed:: Yes Do you feel safe going back to the place where you live?: Yes      Need for Family Participation in Patient Care: Yes (Comment) Care giver support system in place?: Yes (comment)   Criminal Activity/Legal Involvement Pertinent to Current Situation/Hospitalization: No - Comment as needed  Activities of Daily Living   ADL Screening (condition at time of admission) Independently performs ADLs?: Yes (appropriate for  developmental age) Is the patient deaf or have difficulty hearing?: No Does the patient have difficulty seeing, even when wearing glasses/contacts?: No Does the patient have difficulty concentrating, remembering, or making decisions?: No  Permission Sought/Granted   Permission granted to share information with : Yes, Verbal Permission Granted     Permission granted to share info w AGENCY: [facility]        Emotional Assessment Appearance:: Appears stated age Attitude/Demeanor/Rapport: Engaged Affect (typically observed): Appropriate Orientation: : Oriented to Self, Oriented to Place, Oriented to  Time, Oriented to Situation Alcohol / Substance Use: Not Applicable Psych Involvement: No (comment)  Admission diagnosis:  Primary osteoarthritis of left shoulder [M19.012] S/P reverse total shoulder arthroplasty, 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] Patient Active Problem List   Diagnosis Date Noted   S/P reverse total shoulder arthroplasty, left 07/15/2024   Coronary artery disease involving native coronary artery of native heart without angina pectoris 10/06/2021   Pure hypercholesterolemia 10/06/2021   Glaucoma suspect, left eye 08/16/2021   Central retinal artery occlusion of right eye 08/05/2020   Optic atrophy, right eye 08/05/2020   PCP:  Yolande Toribio MATSU, MD Pharmacy:   [facility] [id number] - [place], [place] - [address] AT [facility] [address] [place] [address] Phone: [phone] Fax: [phone]  [facility] #[id number] - [place], [place] - [address] AT [address] [place] [address]  Phone: [phone] Fax: [phone]     Social Drivers of Health (SDOH) Social History: SDOH Screenings   Food Insecurity: No Food Insecurity (07/15/2024)  Housing: Unknown (07/15/2024)  Transportation Needs: No Transportation Needs (07/15/2024)  Utilities: Not At Risk (07/15/2024)  Social Connections: Socially Integrated  (07/15/2024)  Tobacco Use: Medium Risk (07/15/2024)   SDOH Interventions:     Readmission Risk Interventions     No data to display

## 2024-07-17 ENCOUNTER — Telehealth: Payer: Self-pay | Admitting: Orthopedic Surgery

## 2024-07-17 NOTE — Telephone Encounter (Signed)
 Delon from [facility] called for verbal orders. PT 1 wk 1, 2 wk 1, and 3 wk 1. Clarification for restrictions for the L shoulder. CB#[phone]

## 2024-07-18 NOTE — Telephone Encounter (Signed)
 I tried to call to give verbal orders, number provided is not a working number.

## 2024-07-21 ENCOUNTER — Telehealth: Payer: Self-pay | Admitting: Orthopedic Surgery

## 2024-07-21 NOTE — Telephone Encounter (Signed)
 Patient's wife called. Says he has not started his home PT yet. Would like someone to check on this for her.

## 2024-07-22 ENCOUNTER — Encounter: Payer: Self-pay | Admitting: Cardiology

## 2024-07-27 DIAGNOSIS — M19012 Primary osteoarthritis, left shoulder: Secondary | ICD-10-CM

## 2024-07-28 NOTE — Discharge Summary (Signed)
 Physician Discharge Summary      Patient ID: Jaime [name] MRN: [id number] DOB/AGE: July 06, 1931 89 y.o.  Admit date: 07/15/2024 Discharge date: 07/16/2024  Admission Diagnoses:  Principal Problem:   S/P reverse total shoulder arthroplasty, left Active Problems:   Arthritis of left shoulder   Discharge Diagnoses:  Same  Surgeries: Procedure(s): ARTHROPLASTY, SHOULDER, TOTAL, REVERSE on 07/15/2024   Consultants:   Discharged Condition: Stable  Hospital Course: Jaime [name] is an 89 y.o. male who was admitted 07/15/2024 with a chief complaint of left shoulder pain, and found to have a diagnosis of left shoulder arthritis.  They were brought to the operating room on 07/15/2024 and underwent the above named procedures.  Pt awoke from anesthesia without complication and was transferred to the floor. On POD1, patient's pain was controlled.  Block still in effect to some degree but quickly wearing off.  He was doing well without any complaint of chest pain, shortness of breath, calf pain, abdominal pain.  He was ordering breakfast at the time of evaluation.  He stated I want to get out of here.  He was discharged home on POD 1..  Pt will f/u with Dr. Addie in clinic in ~2 weeks.   Antibiotics given:  Anti-infectives (From admission, onward)    Start     Dose/Rate Route Frequency Ordered Stop   07/15/24 1730  ceFAZolin  (ANCEF ) IVPB 2g/100 mL premix        2 g 200 mL/hr over 30 Minutes Intravenous Every 8 hours 07/15/24 1636 07/16/24 0946   07/15/24 0819  vancomycin  (VANCOCIN ) powder  Status:  Discontinued          As needed 07/15/24 0819 07/15/24 1026   07/15/24 0615  ceFAZolin  (ANCEF ) IVPB 2g/100 mL premix        2 g 200 mL/hr over 30 Minutes Intravenous On call to O.R. 07/15/24 0600 07/15/24 0735     .  Recent vital signs:  Vitals:   07/16/24 0300 07/16/24 0745  BP: 130/61 (!) 108/51  Pulse: 72 69  Resp: 18 16  Temp: 97.6 F (36.4 C) 97.8 F (36.6 C)  SpO2: 95% 97%     Recent laboratory studies:  Results for orders placed or performed during the hospital encounter of 07/15/24  Glucose, capillary   Collection Time: 07/15/24  6:13 AM  Result Value Ref Range   Glucose-Capillary 102 (H) 70 - 99 mg/dL   Comment 1 Notify RN   Glucose, capillary   Collection Time: 07/15/24 10:33 AM  Result Value Ref Range   Glucose-Capillary 105 (H) 70 - 99 mg/dL   Comment 1 Notify RN     Discharge Medications:   Allergies as of 07/16/2024   No Known Allergies      Medication List     STOP taking these medications    ibuprofen 800 MG tablet Commonly known as: ADVIL       TAKE these medications    acetaminophen  325 MG tablet Commonly known as: TYLENOL  Take 1-2 tablets (325-650 mg total) by mouth every 6 (six) hours as needed for mild pain (pain score 1-3) (or temp > 100.5).   aspirin  EC 81 MG tablet Take 81 mg by mouth daily. Swallow whole.   docusate sodium  100 MG capsule Commonly known as: COLACE Take 1 capsule (100 mg total) by mouth 2 (two) times daily.   gabapentin  300 MG capsule Commonly known as: NEURONTIN  Take 1 capsule (300 mg total) by mouth at bedtime.   Jardiance  25 MG Tabs  tablet Generic drug: empagliflozin  Take 25 mg by mouth daily.   latanoprost 0.005 % ophthalmic solution Commonly known as: XALATAN Place 1 drop into the left eye at bedtime.   levothyroxine  175 MCG tablet Commonly known as: SYNTHROID  Take 175 mcg by mouth daily before breakfast.   methocarbamol  500 MG tablet Commonly known as: ROBAXIN  Take 1 tablet (500 mg total) by mouth every 8 (eight) hours as needed for muscle spasms.   metoprolol  succinate 25 MG 24 hr tablet Commonly known as: TOPROL -XL Take 25 mg by mouth daily.   mirabegron  ER 50 MG Tb24 tablet Commonly known as: MYRBETRIQ  Take 50 mg by mouth daily.   multivitamin with minerals Tabs tablet Take 1 tablet by mouth daily.   nitroGLYCERIN  0.4 MG SL tablet Commonly known as: NITROSTAT  Place  1 tablet (0.4 mg total) under the tongue every 5 (five) minutes as needed for up to 25 days for chest pain.   oxyCODONE  5 MG immediate release tablet Commonly known as: Oxy IR/ROXICODONE  Take 1 tablet (5 mg total) by mouth every 4 (four) hours as needed for moderate pain (pain score 4-6) (pain score 4-6).   oxymetazoline 0.05 % nasal spray Commonly known as: AFRIN Place 1 spray into both nostrils daily as needed for congestion.   rosuvastatin 20 MG tablet Commonly known as: CRESTOR Take 20 mg by mouth daily.   silodosin 8 MG Caps capsule Commonly known as: RAPAFLO Take 8 mg by mouth daily with breakfast.   traZODone  50 MG tablet Commonly known as: DESYREL  Take 50 mg by mouth at bedtime.   Tresiba FlexTouch 100 UNIT/ML FlexTouch Pen Generic drug: insulin  degludec Inject 17 Units into the skin daily.   vitamin D3 50 MCG (2000 UT) Caps Take 2,000 Units by mouth daily.   vitamin E 180 MG (400 UNITS) capsule Take 400 Units by mouth daily.        Diagnostic Studies: DG Shoulder Left Port Result Date: 07/15/2024 CLINICAL DATA:  Left shoulder arthroplasty EXAM: LEFT SHOULDER COMPARISON:  11/28/2023 FINDINGS: Frontal view of the left shoulder demonstrates interval placement of total shoulder arthroplasty in the expected position without evidence of acute complication. Postsurgical changes in the overlying soft tissues. The left chest is clear. IMPRESSION: 1. Unremarkable left shoulder arthroplasty. Electronically Signed   By: Ozell Daring M.D.   On: 07/15/2024 15:26   US  Guided Needle Placement - No Linked Charges Result Date: 07/12/2024 Ultrasound imaging demonstrates needle placement into the PIP joint of the left index finger.  Injection of fluid into the joint with no complicating features   Disposition: Discharge disposition: 01-Home or Self Care       Discharge Instructions     Call MD / Call 911   Complete by: As directed    If you experience chest pain or  shortness of breath, CALL 911 and be transported to the hospital emergency room.  If you develope a fever above 101 F, pus (white drainage) or increased drainage or redness at the wound, or calf pain, call your surgeon's office.   Constipation Prevention   Complete by: As directed    Drink plenty of fluids.  Prune juice may be helpful.  You may use a stool softener, such as Colace (over the counter) 100 mg twice a day.  Use MiraLax (over the counter) for constipation as needed.   Diet - low sodium heart healthy   Complete by: As directed    Discharge instructions   Complete by: As directed  You may shower, dressing is waterproof.  Do not bathe or soak the operative shoulder in a tub, pool.  Continue with range of motion exercises as described by occupational therapist from hospital and continue with home health occupational therapy that is set up for you.  If you do not hear anything from home health occupational therapy, call our office..  No lifting with the operative shoulder. Continue use of the sling, come out of the sling throughout the day in order to work on elbow range of motion..  Follow-up with Dr. Addie in ~2 weeks on your given appointment date.  We will remove your adhesive bandage at that time.    Dental Antibiotics:  In most cases prophylactic antibiotics for Dental procdeures after total joint surgery are not necessary.  Exceptions are as follows:  1. History of prior total joint infection  2. Severely immunocompromised (Organ Transplant, cancer chemotherapy, Rheumatoid biologic meds such as Humera)  3. Poorly controlled diabetes (A1C &gt; 8.0, blood glucose over 200)  If you have one of these conditions, contact your surgeon for an antibiotic prescription, prior to your dental procedure.   Increase activity slowly as tolerated   Complete by: As directed    Post-operative opioid taper instructions:   Complete by: As directed    POST-OPERATIVE OPIOID TAPER  INSTRUCTIONS: It is important to wean off of your opioid medication as soon as possible. If you do not need pain medication after your surgery it is ok to stop day one. Opioids include: Codeine, Hydrocodone(Norco, Vicodin), Oxycodone (Percocet, oxycontin ) and hydromorphone  amongst others.  Long term and even short term use of opiods can cause: Increased pain response Dependence Constipation Depression Respiratory depression And more.  Withdrawal symptoms can include Flu like symptoms Nausea, vomiting And more Techniques to manage these symptoms Hydrate well Eat regular healthy meals Stay active Use relaxation techniques(deep breathing, meditating, yoga) Do Not substitute Alcohol to help with tapering If you have been on opioids for less than two weeks and do not have pain than it is ok to stop all together.  Plan to wean off of opioids This plan should start within one week post op of your joint replacement. Maintain the same interval or time between taking each dose and first decrease the dose.  Cut the total daily intake of opioids by one tablet each day Next start to increase the time between doses. The last dose that should be eliminated is the evening dose.             SignedBETHA Herlene Calix 07/28/2024, 8:30 AM

## 2024-07-30 ENCOUNTER — Other Ambulatory Visit: Payer: Self-pay

## 2024-07-30 ENCOUNTER — Ambulatory Visit (INDEPENDENT_AMBULATORY_CARE_PROVIDER_SITE_OTHER): Admitting: Orthopedic Surgery

## 2024-07-30 ENCOUNTER — Encounter: Payer: Self-pay | Admitting: Orthopedic Surgery

## 2024-07-30 DIAGNOSIS — Z96612 Presence of left artificial shoulder joint: Secondary | ICD-10-CM

## 2024-08-02 ENCOUNTER — Encounter: Payer: Self-pay | Admitting: Orthopedic Surgery

## 2024-08-02 NOTE — Progress Notes (Signed)
 Post-Op Visit Note   Patient: Jaime [name]           Date of Birth: 25-Aug-1931           MRN: [id number] Visit Date: 07/30/2024 PCP: Yolande Toribio MATSU, MD   Assessment & Plan:  Chief Complaint:  Chief Complaint  Patient presents with   Left Shoulder - Routine Post Op    Left RSA 07/15/24   Visit Diagnoses:  1. History of arthroplasty of left shoulder     Plan: Patient is now 2 weeks out left reverse shoulder replacement.  Taking Advil for pain along with tramadol.  On exam his deltoid fires.  Incision intact.  Has some resolving ecchymosis along his arm and trunk.  Radial pulse intact in the hand motor or sensory function intact in the hand.  Overall his passive and active range of motion are improving.  Would like to get him set up for therapy at his place of residence which does have onsite physical therapy and Occupational Therapy.  Come back in 4 weeks for clinical recheck.  No lifting more than a couple pounds with that arm until we see him back.  He is fine for range of motion as tolerated at this time  Follow-Up Instructions: No follow-ups on file.   Orders:  Orders Placed This Encounter  Procedures   XR Shoulder Left   No orders of the defined types were placed in this encounter.   Imaging: No results found.  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   Past Medical History:  Diagnosis Date   Arthritis    Coronary artery disease 06/04/2009   stenting to the mid LAD with Xience 2.5 x 32 and overlapping 2.5 x 12 mm DES, [place], [place]   Diabetes mellitus  without complication (HCC)    Hyperlipidemia    Hypertension    Hypothyroidism    Retinal artery occlusion     Family History  Problem Relation Age of Onset   Leukemia Mother    Heart attack Brother    Lung cancer Brother     Past Surgical History:  Procedure Laterality Date   CARDIAC CATHETERIZATION  06/04/2009   Stent to LAD in [place], [place]   REPLACEMENT TOTAL KNEE     bOTH   REVERSE SHOULDER ARTHROPLASTY Left 07/15/2024   Procedure: ARTHROPLASTY, SHOULDER, TOTAL, REVERSE;  Surgeon: Addie Cordella Hamilton, MD;  Location: [facility] OR;  Service: Orthopedics;  Laterality: Left;   ROTATOR CUFF REPAIR Bilateral    Social History   Occupational History   Not on file  Tobacco Use   Smoking status: Former    Current packs/day: 0.00    Average packs/day: 1 pack/day for 10.0 years (10.0 ttl pk-yrs)    Types: Cigarettes    Start date: 03/11/1939    Quit date: 03/10/1949    Years since quitting: 75.4    Passive exposure: Past   Smokeless tobacco: Never  Vaping Use   Vaping status: Never Used  Substance and Sexual Activity   Alcohol use: Not Currently    Comment: Drinks Socially   Drug use: Never  Sexual activity: Not Currently    Partners: Female

## 2024-08-05 ENCOUNTER — Encounter: Payer: Self-pay | Admitting: Orthopedic Surgery

## 2024-08-06 ENCOUNTER — Other Ambulatory Visit: Payer: Self-pay | Admitting: Surgical

## 2024-08-06 MED ORDER — TRAMADOL HCL 50 MG PO TABS: 50.0000 mg | ORAL_TABLET | Freq: Four times a day (QID) | ORAL | 0 refills | Status: AC | PRN

## 2024-08-07 ENCOUNTER — Telehealth: Payer: Self-pay | Admitting: Orthopedic Surgery

## 2024-08-07 NOTE — Telephone Encounter (Signed)
 Okay for all ROM but I wouldn't try and get his hand behind his back yet

## 2024-08-07 NOTE — Telephone Encounter (Signed)
 Leizl from nursing rehab called. She would like to know

## 2024-08-07 NOTE — Telephone Encounter (Signed)
 Jaime [name] would like to know if she could do abduction rom and flexion external and internal rotation? Her cb# [phone]

## 2024-08-08 ENCOUNTER — Telehealth: Payer: Self-pay | Admitting: Surgical

## 2024-08-08 NOTE — Telephone Encounter (Signed)
 LVM for cb

## 2024-08-08 NOTE — Telephone Encounter (Signed)
 Called back-I advised

## 2024-08-08 NOTE — Telephone Encounter (Signed)
 IC advised per Brandy message from yesterday.

## 2024-08-08 NOTE — Telephone Encounter (Signed)
 Leizl  returning  Her cb# [phone]

## 2024-08-27 ENCOUNTER — Encounter: Payer: Self-pay | Admitting: Orthopedic Surgery

## 2024-08-27 ENCOUNTER — Ambulatory Visit: Admitting: Orthopedic Surgery

## 2024-08-27 DIAGNOSIS — Z96612 Presence of left artificial shoulder joint: Secondary | ICD-10-CM

## 2024-08-27 NOTE — Progress Notes (Signed)
   Post-Op Visit Note   Patient: Jaime [name]           Date of Birth: Jan 03, 1931           MRN: [id number] Visit Date: 08/27/2024 PCP: Yolande Toribio MATSU, MD   Assessment & Plan:  Chief Complaint:  Chief Complaint  Patient presents with   Left Shoulder - Routine Post Op      Left RSA 07/15/24     Visit Diagnoses:  1. History of arthroplasty of left shoulder     Plan: Number to 6 weeks out left reverse shoulder replacement.  Doing well without any issues.  On exam he has got forward flexion to about 90 and abduction to around 85.  Passive range of motion is 50/90/120.  Plan at this time is to spend about half the time in therapy working on range of motion and about half the time working on strengthening.  Come back in 6 weeks for final check.  Did write him a prescription to give to his therapist.  Follow-Up Instructions: No follow-ups on file.   Orders:  No orders of the defined types were placed in this encounter.  No orders of the defined types were placed in this encounter.   Imaging: No results found.  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   Past Medical History:  Diagnosis Date   Arthritis    Coronary artery disease 06/04/2009   stenting to the mid LAD with Xience 2.5 x 32 and overlapping 2.5 x 12 mm DES, [place], [place]   Diabetes mellitus without complication (HCC)    Hyperlipidemia    Hypertension    Hypothyroidism    Retinal artery occlusion     Family History  Problem Relation Age of Onset   Leukemia Mother    Heart attack Brother    Lung  cancer Brother     Past Surgical History:  Procedure Laterality Date   CARDIAC CATHETERIZATION  06/04/2009   Stent to LAD in [place], [place]   REPLACEMENT TOTAL KNEE     bOTH   REVERSE SHOULDER ARTHROPLASTY Left 07/15/2024   Procedure: ARTHROPLASTY, SHOULDER, TOTAL, REVERSE;  Surgeon: Addie Cordella Hamilton, MD;  Location: [facility] OR;  Service: Orthopedics;  Laterality: Left;   ROTATOR CUFF REPAIR Bilateral    Social History   Occupational History   Not on file  Tobacco Use   Smoking status: Former    Current packs/day: 0.00    Average packs/day: 1 pack/day for 10.0 years (10.0 ttl pk-yrs)    Types: Cigarettes    Start date: 03/11/1939    Quit date: 03/10/1949    Years since quitting: 75.5    Passive exposure: Past   Smokeless tobacco: Never  Vaping Use   Vaping status: Never Used  Substance and Sexual Activity   Alcohol use: Not Currently    Comment: Drinks Socially   Drug use: Never   Sexual activity: Not Currently    Partners: Female

## 2024-09-04 ENCOUNTER — Ambulatory Visit
Admission: RE | Admit: 2024-09-04 | Discharge: 2024-09-04 | Disposition: A | Source: Ambulatory Visit | Attending: Registered Nurse | Admitting: Registered Nurse

## 2024-09-04 ENCOUNTER — Other Ambulatory Visit: Payer: Self-pay | Admitting: Registered Nurse

## 2024-09-04 ENCOUNTER — Other Ambulatory Visit

## 2024-09-04 DIAGNOSIS — R310 Gross hematuria: Secondary | ICD-10-CM

## 2024-09-04 DIAGNOSIS — R10A1 Flank pain, right side: Secondary | ICD-10-CM

## 2024-09-08 ENCOUNTER — Encounter: Payer: Self-pay | Admitting: Radiology

## 2024-10-05 ENCOUNTER — Emergency Department (HOSPITAL_BASED_OUTPATIENT_CLINIC_OR_DEPARTMENT_OTHER)
Admission: EM | Admit: 2024-10-05 | Discharge: 2024-10-05 | Disposition: A | Discharge status: Home / Self Care | Attending: Emergency Medicine | Admitting: Emergency Medicine

## 2024-10-05 ENCOUNTER — Other Ambulatory Visit: Payer: Self-pay

## 2024-10-05 ENCOUNTER — Emergency Department (HOSPITAL_BASED_OUTPATIENT_CLINIC_OR_DEPARTMENT_OTHER)

## 2024-10-05 ENCOUNTER — Encounter (HOSPITAL_BASED_OUTPATIENT_CLINIC_OR_DEPARTMENT_OTHER): Payer: Self-pay

## 2024-10-05 DIAGNOSIS — R319 Hematuria, unspecified: Secondary | ICD-10-CM

## 2024-10-05 DIAGNOSIS — Z7982 Long term (current) use of aspirin: Secondary | ICD-10-CM | POA: Insufficient documentation

## 2024-10-05 DIAGNOSIS — N3289 Other specified disorders of bladder: Secondary | ICD-10-CM | POA: Diagnosis not present

## 2024-10-05 LAB — URINALYSIS, MICROSCOPIC (REFLEX): RBC / HPF: >50 RBC/hpf (ref 0–5)

## 2024-10-05 LAB — BASIC METABOLIC PANEL WITH GFR
Anion gap: 13 (ref 5–15)
BUN: 27 mg/dL — ABNORMAL HIGH (ref 8–23)
CO2: 24 mmol/L (ref 22–32)
Calcium: 9.1 mg/dL (ref 8.9–10.3)
Chloride: 102 mmol/L (ref 98–111)
Creatinine, Ser: 0.95 mg/dL (ref 0.61–1.24)
GFR, Estimated: >60 mL/min (ref >60)
Glucose, Bld: 222 mg/dL — ABNORMAL HIGH (ref 70–99)
Potassium: 4.6 mmol/L (ref 3.5–5.1)
Sodium: 139 mmol/L (ref 135–145)

## 2024-10-05 LAB — CBC
HCT: 39.6 % (ref 39.0–52.0)
Hemoglobin: 12.4 g/dL — ABNORMAL LOW (ref 13.0–17.0)
MCH: 19.3 pg — ABNORMAL LOW (ref 26.0–34.0)
MCHC: 31.3 g/dL (ref 30.0–36.0)
MCV: 61.8 fL — ABNORMAL LOW (ref 80.0–100.0)
Platelets: 105 K/uL — ABNORMAL LOW (ref 150–400)
RBC: 6.41 MIL/uL — ABNORMAL HIGH (ref 4.22–5.81)
RDW: 17.8 % — ABNORMAL HIGH (ref 11.5–15.5)
WBC: 8.8 K/uL (ref 4.0–10.5)
nRBC: 0 % (ref 0.0–0.2)

## 2024-10-05 LAB — URINALYSIS, ROUTINE W REFLEX MICROSCOPIC
Bilirubin Urine: NEGATIVE
Glucose, UA: >=500 mg/dL — AB
Ketones, ur: NEGATIVE mg/dL
Leukocytes,Ua: NEGATIVE
Nitrite: NEGATIVE
Protein, ur: 100 mg/dL — AB
Specific Gravity, Urine: 1.015 (ref 1.005–1.030)
pH: 6 (ref 5.0–8.0)

## 2024-10-05 MED ORDER — IOHEXOL 300 MG/ML  SOLN
100.0000 mL | Freq: Once | INTRAMUSCULAR | Status: AC | PRN
  Administered 2024-10-05: 80 mL via INTRAVENOUS

## 2024-10-05 NOTE — ED Triage Notes (Signed)
 Hematuria, dysuria for 1 month and R flank pain for 4 days.

## 2024-10-05 NOTE — Discharge Instructions (Signed)
 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  Please return to emergency department for any lightheadedness, dizziness, passing out associated to bleeding.  Please return for abdominal pain or urinary obstruction.  Take Tylenol  650 mg every 8 hours for pain.

## 2024-10-05 NOTE — ED Notes (Signed)
 ED Provider at bedside.

## 2024-10-05 NOTE — ED Provider Notes (Signed)
 Jaime [name]   Jaime [name] is a 89 y.o. male.   Patient is a 89 year old male with past medical history as seen above presenting for [name] with blood clots.  Patient states he has a dysuria intermittently for the past month.  He has had [name] that started 4 days ago with associated right-sided flank pain.  He states on 09/04/2024 he had a CT of the abdomen pelvis without contrast that demonstrated a possible mass or lesion in the bladder.  He has scheduled urology follow-up but was unable to get in promptly.  His scheduled appointment is for next week.  He states he is able to empty his bladder completely and is not having any difficulty urinating.  The history is provided by the patient. No language interpreter was used.  [name] Pertinent negatives include no chest pain, no abdominal pain and no shortness of breath.       Prior to Admission medications   Medication Sig Start Date End Date Taking? Authorizing Provider  traMADol  (ULTRAM ) 50 MG tablet Take 1 tablet (50 mg total) by mouth every 6 (six) hours as needed. 08/06/24 08/06/25  Magnant, Charles L, PA-C  acetaminophen  (TYLENOL ) 325 MG tablet Take 1-2 tablets (325-650 mg total) by mouth every 6 (six) hours as needed for mild pain (pain score 1-3) (or temp > 100.5). 07/16/24   Magnant, Carlin CROME, PA-C  aspirin  EC 81 MG tablet Take 81 mg by mouth daily. Swallow whole.    [provider]  docusate sodium  (COLACE) 100 MG capsule Take 1 capsule (100 mg total) by mouth 2 (two) times daily. 07/16/24   Magnant, Charles L, PA-C  gabapentin  (NEURONTIN ) 300 MG capsule Take 1 capsule (300 mg total) by mouth at bedtime. 12/05/21   Williams, Megan E, NP  JARDIANCE  25 MG TABS tablet Take 25 mg by mouth daily. 02/17/20   [provider]  latanoprost (XALATAN) 0.005 %  ophthalmic solution Place 1 drop into the left eye at bedtime. 04/24/24   [provider]  levothyroxine  (SYNTHROID ) 175 MCG tablet Take 175 mcg by mouth daily before breakfast.    [provider]  methocarbamol  (ROBAXIN ) 500 MG tablet Take 1 tablet (500 mg total) by mouth every 8 (eight) hours as needed for muscle spasms. 07/16/24   Magnant, Carlin CROME, PA-C  metoprolol  succinate (TOPROL -XL) 25 MG 24 hr tablet Take 25 mg by mouth daily. 05/31/24   [provider]  mirabegron  ER (MYRBETRIQ ) 50 MG TB24 tablet Take 50 mg by mouth daily.    [provider]  Multiple Vitamin (MULTIVITAMIN WITH MINERALS) TABS tablet Take 1 tablet by mouth daily.    [provider]  nitroGLYCERIN  (NITROSTAT ) 0.4 MG SL tablet Place 1 tablet (0.4 mg total) under the tongue every 5 (five) minutes as needed for up to 25 days for chest pain. 03/15/20 06/03/24  Ladona Heinz, MD  oxymetazoline (AFRIN) 0.05 % nasal spray Place 1 spray into both nostrils daily as needed for congestion.    [provider]  rosuvastatin (CRESTOR) 20 MG tablet Take 20 mg by mouth daily. 03/11/20   [provider]  silodosin (RAPAFLO) 8 MG CAPS capsule Take 8 mg by mouth daily with breakfast.    [provider]  traZODone  (DESYREL ) 50 MG tablet Take 50 mg by mouth at bedtime.    [provider]  TRESIBA FLEXTOUCH 100 UNIT/ML FlexTouch Pen Inject 17 Units into the skin daily. 03/12/20   [provider]  Vitamin D, Cholecalciferol, 50 MCG (2000 UT) CAPS Take 2,000 Units by mouth daily.    [provider]  vitamin E 180 MG (400 UNITS) capsule Take 400 Units by mouth daily.    [provider]    Allergies: Patient has no known allergies.    Review of Systems  Constitutional:  Negative for chills and fever.  HENT:  Negative for ear pain and sore throat.   Eyes:  Negative for pain and visual disturbance.  Respiratory:  Negative for cough and shortness of  breath.   Cardiovascular:  Negative for chest pain and palpitations.  Gastrointestinal:  Negative for abdominal pain and vomiting.  Genitourinary:  Positive for flank pain and [name]. Negative for dysuria.  Musculoskeletal:  Negative for arthralgias and back pain.  Skin:  Negative for color change and rash.  Neurological:  Negative for seizures and syncope.  All other systems reviewed and are negative.   Updated Vital Signs BP (!) 158/79   Pulse 76   Temp 97.6 F (36.4 C) (Oral)   Resp 17   SpO2 100%   Physical Exam Vitals and nursing note reviewed.  Constitutional:      General: He is not in acute distress.    Appearance: He is well-developed.  HENT:     Head: Normocephalic.  Cardiovascular:     Rate and Rhythm: Normal rate and regular rhythm.  Pulmonary:     Effort: Pulmonary effort is normal. No respiratory distress.  Abdominal:     Palpations: Abdomen is soft.     Tenderness: There is no abdominal tenderness.  Musculoskeletal:        General: No swelling.     Cervical back: Neck supple.  Skin:    General: Skin is warm and dry.     Capillary Refill: Capillary refill takes less than 2 seconds.  Neurological:     Mental Status: He is alert.     (all labs ordered are listed, but only abnormal results are displayed) Labs Reviewed  URINALYSIS, ROUTINE W REFLEX MICROSCOPIC - Abnormal; Notable for the following components:      Result Value   APPearance CLOUDY (*)    Glucose, UA >=500 (*)    Hgb urine dipstick LARGE (*)    Protein, ur 100 (*)    All other components within normal limits  BASIC METABOLIC PANEL WITH GFR - Abnormal; Notable for the following components:   Glucose, Bld 222 (*)    BUN 27 (*)    All other components within normal limits  CBC - Abnormal; Notable for the following components:   RBC 6.41 (*)    Hemoglobin 12.4 (*)    MCV 61.8 (*)    MCH 19.3 (*)    RDW 17.8 (*)    Platelets 105 (*)    All other components within normal limits   URINALYSIS, MICROSCOPIC (REFLEX) - Abnormal; Notable for the following components:   Bacteria, UA FEW (*)    All other components within normal limits    EKG: None  Radiology: CT ABDOMEN PELVIS W CONTRAST Result Date: 10/05/2024 CLINICAL DATA:  New [name] with dysuria 1 month. Right flank pain 4 days. Questionable bladder mass found on previous CT 09/04/2024. EXAM: CT ABDOMEN AND PELVIS WITH CONTRAST TECHNIQUE: Multidetector CT imaging of the abdomen and pelvis was performed using the standard protocol following bolus administration  of intravenous contrast. RADIATION DOSE REDUCTION: This exam was performed according to the departmental dose-optimization program which includes automated exposure control, adjustment of the mA and/or kV according to patient size and/or use of iterative reconstruction technique. CONTRAST:  80mL OMNIPAQUE IOHEXOL 300 MG/ML  SOLN COMPARISON:  09/04/2024 FINDINGS: Lower chest: Heart is normal in size. Calcified plaque over the mitral valve annulus. Calcified plaque over the right coronary artery and descending thoracic aorta. Moderate size hiatal hernia unchanged. Visualized lung bases demonstrate a stable 3 mm nodule over the right lower lobe no acute airspace process or effusion. Hepatobiliary: Mild diffuse low-attenuation of the liver compatible with a degree of steatosis. Subcentimeter hypodensity over the inferior right lobe of the liver too small to characterize but likely a cyst or hemangioma. Gallbladder and biliary tree are normal. Pancreas: Normal. Spleen: Borderline stable cardiomegaly. Adrenals/Urinary Tract: Adrenal glands are normal. Kidneys are normal in size. No focal renal mass or nephrolithiasis. Mild prominence of the left intrarenal collecting system and renal pelvis with possible slight interval worsening. Ureters are unremarkable. Bladder demonstrates a 1.6 x 2.9 cm enhancing mass along the nondependent anterior wall just right of midline without  significant change in concerning for neoplastic process. Stomach/Bowel: Moderate size hiatal hernia unchanged. Small bowel is unremarkable. Appendix not well visualized. Severe diverticulosis of the colon most prominent over the rectum without active inflammation. Vascular/Lymphatic: Mild calcified plaque over the abdominal aorta which is normal in caliber. Remaining vascular structures are unremarkable. No significant adenopathy. Reproductive: Stable prostatic enlargement. Other: No free fluid or focal inflammatory change. Musculoskeletal: Degenerative change of the spine and hips. IMPRESSION: 1. No acute findings in the abdomen/pelvis. 2. 1.6 x 2.9 cm enhancing mass along the nondependent anterior wall of the bladder just right of midline without significant change and concerning for neoplastic process. Recommend urologic consultation/cystoscopy for further evaluation. 3. Mild prominence of the left intrarenal collecting system and renal pelvis with possible slight interval worsening. 4. Severe diverticulosis of the colon most prominent over the rectum without active inflammation. 5. Stable moderate size hiatal hernia. 6. Stable prostatic enlargement. 7. Aortic atherosclerosis. Atherosclerotic coronary artery disease. 8. Stable 3 mm right lower lobe pulmonary nodule. If patient is low risk for malignancy, no routine follow-up imaging is recommended. If patient is high risk for malignancy, a non-contrast chest CT at 12 months is optional.This recommendation follows the consensus statement: Guidelines for Management of Incidental Pulmonary Nodules Detected on CT Images: From the Fleischner Society 2017; Radiology 2017; [phone]. Aortic Atherosclerosis (ICD10-I70.0). Electronically Signed   By: Toribio Agreste M.D.   On: 10/05/2024 13:07     Procedures   Medications Ordered in the ED  iohexol (OMNIPAQUE) 300 MG/ML solution 100 mL (80 mLs Intravenous Contrast Given 10/05/24 1238)                                     Medical Decision Making Amount and/or Complexity of Data Reviewed Labs: ordered. Radiology: ordered.  Risk Prescription drug management.   89 year old male with past medical history as seen above presenting for [name] with blood clots.  Patient is alert and active entry, no acute distress, afebrile, so vital signs.  Abdomen is soft and nontender.  He states he feels like he is emptying his bladder completely.  No decreased urine production.  Less concern for urinary obstruction.  Patient has frank [name] with clots present.  No urinary tract infection.  Hemoglobin  stable.  Renal function is stable.  Had recent noncontrasted CT imaging on 09/04/2024 demonstrating mass in the bladder.  Recommended for follow-up with urology.  Has not gotten in with urology-scheduled out greater than 1 month at the soonest.  His appointment is next week.  Repeat imaging with IV contrast demonstrates a  1.6 x 2.9 cm enhancing mass along the nondependent anterior wall of the bladder just right of midline without significant change and concerning for neoplastic process.   I spoke with our on-call urology team and in the setting of a stable hemoglobin, no urinary obstruction, and stable renal function recommend waiting for his outpatient appointment for next week.  Strict return precautions given for any worsening of symptoms or urinary obstruction.  Patient in no distress and overall condition improved here in the ED. Detailed discussions were had with the patient regarding current findings, and need for close f/u with PCP or on call doctor. The patient has been instructed to return immediately if the symptoms worsen in any way for re-evaluation. Patient verbalized understanding and is in agreement with current care plan. All questions answered prior to discharge.     Final diagnoses:  [name], unspecified type  Bladder mass    ED Discharge Orders     None          Elnor Bernarda SQUIBB,  DO 10/05/24 1404

## 2024-10-08 ENCOUNTER — Ambulatory Visit: Admitting: Orthopedic Surgery

## 2024-10-08 ENCOUNTER — Encounter: Payer: Self-pay | Admitting: Orthopedic Surgery

## 2024-10-08 DIAGNOSIS — Z96612 Presence of left artificial shoulder joint: Secondary | ICD-10-CM

## 2024-10-08 NOTE — Progress Notes (Unsigned)
   Post-Op Visit Note   Patient: Jaime [name]           Date of Birth: [birth year]           MRN: [id number] Visit Date: 10/08/2024 PCP: Yolande Toribio MATSU, MD   Assessment & Plan:  Chief Complaint: No chief complaint on file.  Visit Diagnoses:  1. History of arthroplasty of left shoulder     Plan: Bonni is now about 3 months out left reverse shoulder replacement.  Doing physical therapy at [facility] burn.  Not having any pain.  Still would like to get a little bit more function in terms of forward flexion and elevation.  On exam he does have forward flexion elevation and abduction above 90.  Strength is slowly improving.  Plan at this time is to continue therapy 2 more times a week for 8 weeks and pending Byrnett and 56-month return for final check.  Follow-Up Instructions: No follow-ups on file.   Orders:  No orders of the defined types were placed in this encounter.  No orders of the defined types were placed in this encounter.   Imaging: No results found.  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   Past Medical History:  Diagnosis Date   Arthritis    Coronary artery disease 06/04/2009   stenting to the mid LAD with Xience 2.5 x 32 and overlapping 2.5 x 12 mm DES, [place], [place]   Diabetes mellitus without complication (HCC)    Hyperlipidemia    Hypertension    Hypothyroidism    Retinal artery occlusion     Family History  Problem Relation Age of Onset   Leukemia Mother    Heart attack Brother    Lung cancer Brother     Past Surgical History:  Procedure  Laterality Date   CARDIAC CATHETERIZATION  06/04/2009   Stent to LAD in [place], [place]   REPLACEMENT TOTAL KNEE     bOTH   REVERSE SHOULDER ARTHROPLASTY Left 07/15/2024   Procedure: ARTHROPLASTY, SHOULDER, TOTAL, REVERSE;  Surgeon: Addie Cordella Hamilton, MD;  Location: [facility] OR;  Service: Orthopedics;  Laterality: Left;   ROTATOR CUFF REPAIR Bilateral    Social History   Occupational History   Not on file  Tobacco Use   Smoking status: Former    Current packs/day: 0.00    Average packs/day: 1 pack/day for 10.0 years (10.0 ttl pk-yrs)    Types: Cigarettes    Start date: 03/11/1939    Quit date: 03/10/1949    Years since quitting: 75.6    Passive exposure: Past   Smokeless tobacco: Never  Vaping Use   Vaping status: Never Used  Substance and Sexual Activity   Alcohol use: Not Currently    Comment: Drinks Socially   Drug use: Never   Sexual activity: Not Currently    Partners: Female

## 2024-10-09 ENCOUNTER — Encounter: Payer: Self-pay | Admitting: Orthopedic Surgery

## 2024-10-14 NOTE — Telephone Encounter (Signed)
 Case Completed, patient notified:  Pre-operative Assessment Date : na Surgery Date: 10/23/24 (this day per Dr. Vannie) Surgery Location: [facility]  Surgery packet sent via portal

## 2024-10-17 ENCOUNTER — Encounter: Payer: Self-pay | Admitting: Cardiology

## 2024-12-23 ENCOUNTER — Ambulatory Visit: Admitting: Cardiology

## 2025-01-07 ENCOUNTER — Ambulatory Visit: Admitting: Orthopedic Surgery
